# Patient Record
Sex: Female | Born: 1935 | Race: White | Hispanic: No | Smoking: Former smoker
Health system: Southern US, Community
[De-identification: ages and names within clinical notes are randomized; demographics above are authoritative.]

## PROBLEM LIST (undated history)

## (undated) DIAGNOSIS — M810 Age-related osteoporosis without current pathological fracture: Secondary | ICD-10-CM

## (undated) DIAGNOSIS — N184 Chronic kidney disease, stage 4 (severe): Secondary | ICD-10-CM

## (undated) DIAGNOSIS — I10 Essential (primary) hypertension: Secondary | ICD-10-CM

## (undated) DIAGNOSIS — M48061 Spinal stenosis, lumbar region without neurogenic claudication: Secondary | ICD-10-CM

## (undated) DIAGNOSIS — G8929 Other chronic pain: Secondary | ICD-10-CM

## (undated) DIAGNOSIS — H409 Unspecified glaucoma: Secondary | ICD-10-CM

## (undated) DIAGNOSIS — M545 Low back pain: Secondary | ICD-10-CM

## (undated) DIAGNOSIS — M1990 Unspecified osteoarthritis, unspecified site: Secondary | ICD-10-CM

## (undated) DIAGNOSIS — E039 Hypothyroidism, unspecified: Secondary | ICD-10-CM

## (undated) DIAGNOSIS — M8580 Other specified disorders of bone density and structure, unspecified site: Secondary | ICD-10-CM

## (undated) HISTORY — DX: Unspecified glaucoma: H40.9

## (undated) HISTORY — DX: Hypothyroidism, unspecified: E03.9

## (undated) HISTORY — PX: KNEE ARTHROSCOPY: SUR90

## (undated) HISTORY — PX: VARICOSE VEIN SURGERY: SHX832

## (undated) HISTORY — DX: Age-related osteoporosis without current pathological fracture: M81.0

## (undated) HISTORY — DX: Essential (primary) hypertension: I10

## (undated) HISTORY — PX: ABDOMINAL HYSTERECTOMY: SHX81

## (undated) HISTORY — PX: ESOPHAGOGASTRODUODENOSCOPY (EGD) WITH ESOPHAGEAL DILATION: SHX5812

## (undated) HISTORY — DX: Other chronic pain: G89.29

## (undated) HISTORY — DX: Spinal stenosis, lumbar region without neurogenic claudication: M48.061

## (undated) HISTORY — DX: Low back pain: M54.5

## (undated) HISTORY — DX: Chronic kidney disease, stage 4 (severe): N18.4

---

## 2002-02-25 ENCOUNTER — Encounter: Payer: Self-pay | Admitting: Orthopaedic Surgery

## 2002-02-25 ENCOUNTER — Ambulatory Visit (HOSPITAL_COMMUNITY): Admission: RE | Admit: 2002-02-25 | Discharge: 2002-02-25 | Payer: Self-pay | Admitting: Orthopaedic Surgery

## 2002-03-25 ENCOUNTER — Ambulatory Visit (HOSPITAL_COMMUNITY): Admission: RE | Admit: 2002-03-25 | Discharge: 2002-03-25 | Payer: Self-pay | Admitting: Unknown Physician Specialty

## 2002-03-25 ENCOUNTER — Encounter: Payer: Self-pay | Admitting: Unknown Physician Specialty

## 2002-08-03 ENCOUNTER — Ambulatory Visit (HOSPITAL_COMMUNITY): Admission: RE | Admit: 2002-08-03 | Discharge: 2002-08-03 | Payer: Self-pay | Admitting: Unknown Physician Specialty

## 2002-09-10 ENCOUNTER — Ambulatory Visit (HOSPITAL_COMMUNITY): Admission: RE | Admit: 2002-09-10 | Discharge: 2002-09-10 | Payer: Self-pay | Admitting: Internal Medicine

## 2003-03-30 ENCOUNTER — Ambulatory Visit (HOSPITAL_COMMUNITY): Admission: RE | Admit: 2003-03-30 | Discharge: 2003-03-30 | Payer: Self-pay | Admitting: Unknown Physician Specialty

## 2004-05-01 ENCOUNTER — Ambulatory Visit (HOSPITAL_COMMUNITY): Admission: RE | Admit: 2004-05-01 | Discharge: 2004-05-01 | Payer: Self-pay | Admitting: Family Medicine

## 2004-06-12 ENCOUNTER — Ambulatory Visit: Payer: Self-pay | Admitting: Family Medicine

## 2004-06-15 ENCOUNTER — Ambulatory Visit: Payer: Self-pay | Admitting: Internal Medicine

## 2004-09-21 ENCOUNTER — Ambulatory Visit: Payer: Self-pay | Admitting: Family Medicine

## 2005-04-03 ENCOUNTER — Ambulatory Visit: Payer: Self-pay | Admitting: Family Medicine

## 2005-05-11 ENCOUNTER — Ambulatory Visit (HOSPITAL_COMMUNITY): Admission: RE | Admit: 2005-05-11 | Discharge: 2005-05-11 | Payer: Self-pay | Admitting: Family Medicine

## 2005-06-13 ENCOUNTER — Ambulatory Visit: Payer: Self-pay | Admitting: Family Medicine

## 2005-06-28 ENCOUNTER — Ambulatory Visit: Payer: Self-pay | Admitting: Internal Medicine

## 2005-08-02 ENCOUNTER — Ambulatory Visit: Payer: Self-pay | Admitting: Family Medicine

## 2005-09-19 ENCOUNTER — Ambulatory Visit: Payer: Self-pay | Admitting: Family Medicine

## 2005-09-26 ENCOUNTER — Emergency Department (HOSPITAL_COMMUNITY): Admission: EM | Admit: 2005-09-26 | Discharge: 2005-09-26 | Payer: Self-pay | Admitting: Emergency Medicine

## 2005-09-28 ENCOUNTER — Emergency Department (HOSPITAL_COMMUNITY): Admission: EM | Admit: 2005-09-28 | Discharge: 2005-09-28 | Payer: Self-pay | Admitting: Emergency Medicine

## 2005-12-04 ENCOUNTER — Encounter: Admission: RE | Admit: 2005-12-04 | Discharge: 2006-01-03 | Payer: Self-pay | Admitting: Orthopedic Surgery

## 2006-01-21 ENCOUNTER — Ambulatory Visit: Payer: Self-pay | Admitting: Family Medicine

## 2006-02-27 ENCOUNTER — Emergency Department (HOSPITAL_COMMUNITY): Admission: EM | Admit: 2006-02-27 | Discharge: 2006-02-27 | Payer: Self-pay | Admitting: Emergency Medicine

## 2006-03-12 ENCOUNTER — Ambulatory Visit: Payer: Self-pay | Admitting: Family Medicine

## 2006-04-12 ENCOUNTER — Ambulatory Visit: Payer: Self-pay | Admitting: Internal Medicine

## 2006-05-21 ENCOUNTER — Ambulatory Visit: Payer: Self-pay | Admitting: Family Medicine

## 2007-06-24 ENCOUNTER — Ambulatory Visit (HOSPITAL_COMMUNITY): Admission: RE | Admit: 2007-06-24 | Discharge: 2007-06-24 | Payer: Self-pay | Admitting: Family Medicine

## 2010-05-16 ENCOUNTER — Ambulatory Visit: Admit: 2010-05-16 | Payer: Self-pay | Admitting: Internal Medicine

## 2010-06-03 ENCOUNTER — Encounter: Payer: Self-pay | Admitting: Family Medicine

## 2010-09-29 NOTE — Procedures (Signed)
   NAME:  Laura Ellis, Laura Ellis                         ACCOUNT NO.:  [id number]   MEDICAL RECORD NO.:  [id number]                   PATIENT TYPE:  OUT   LOCATION:  RAD                                  FACILITY:  [facility]   PHYSICIAN:  Scarlett Presto, M.D.                DATE OF BIRTH:  08-27-1932   DATE OF PROCEDURE:  08/03/2002  DATE OF DISCHARGE:                                  ECHOCARDIOGRAM   TAPE NUMBER:  [id number]   TAPE COUNT:  [id number]   CLINICAL INFORMATION:  This is a 77 year old woman with a systolic murmur.  No other cardiac history.   TECHNICAL QUALITY:  The technical quality of this study was adequate for  interpretation.   MEASUREMENTS M-MODE:  1. The aorta is 31 mm.  2. The left atrium is 37 mm.  3. The septum is 8 mm.  4. The posterior wall is 10 mm.  5. The left ventricular diastolic dimension is 37 mm.  6. The left ventricular systolic dimension is 26 mm.   2-D AND DOPPLER IMAGING:  1. The left ventricle is normal size with normal systolic function.  No wall     motion abnormalities are seen.  2. The right ventricle is normal size with normal systolic function.  3. Both atria are normal size.  There is no atrioseptal defect.  4. The aortic valve is mildly sclerotic with no stenosis or regurgitation.  5. The mitral valve is morphologically unremarkable with trace     insufficiency.  No stenosis is seen.  6. The tricuspid valve is morphologically unremarkable with trace     insufficiency.  No stenosis is seen.  7. The pulmonic valve was not well seen.  8. The ascending aorta was not well seen.  9. There was no pericardial effusion.                                               Scarlett Presto, M.D.    JH/MEDQ  D:  08/03/2002  T:  08/04/2002  Job:  [id number]

## 2010-09-29 NOTE — Consult Note (Signed)
NAME:  Laura Ellis, Laura Ellis                         ACCOUNT NO.:  [id number]   MEDICAL RECORD NO.:  [id number]                   PATIENT TYPE:  OUT   LOCATION:  RAD                                  FACILITY:  [facility]   PHYSICIAN:  Hildred Laser, M.D.                 DATE OF BIRTH:  01/03/1933   DATE OF CONSULTATION:  DATE OF DISCHARGE:  08/03/2002                                   CONSULTATION   REPORT TITLE:  GASTROENTEROLOGY CONSULTATION.   REFERRING PHYSICIAN:  Christell Constant, MD   REASON FOR CONSULTATION:  Dysphagia, indigestion.   HISTORY OF PRESENT ILLNESS:  The patient is a 77 year old Caucasian female  patient of Dr. Mallie Mussel who presents today for evaluation of a several month  history of dysphagia.  She states that she had been on Fosamax for about a  year when, in November 2003, she developed dysphagia and indigestion type  symptoms.  She is off medication.  She has been off the medication now for  four months but continues to have problems swallowing.  She was placed on  Protonix 40 mg daily which seemed to help the heartburn indigestion type  symptoms. She continues to have problems swallowing, however.  Solid foods  seem to stick in her esophagus.  At times, she has to force them down with  fluids. This causes pain.   She denies any abdominal pain.  Her bowels are moving more frequently than  normal for her.  Over the last couple of months, she has been having 3-4  very loose stools daily.  Denies any nocturnal stools.  She has not been on  any recent antibiotics or had any travel abroad.  She denies any bright red  blood per rectum.  Back in November 2003, she did have a couple of black  stools.  She was recently hemoccult negative x1.  She never had an EGD or  colonoscopy.   CURRENT MEDICATIONS:  1. Diazepam 2 mg p.r.n.  2. Clarinex 5 mg every other day.  3. Aspirin 81 mg daily.  4. Ibuprofen 200 mg, three daily.  5. Calcium with vitamin D.  6. Potassium daily.  7.  Gerimed multivitamins daily.  8. Flax seed oil 1000 mg daily.  9. MSM 750 mg daily.  10.      Vitamin E 200 international units daily.  11.      Red yeast rice 600 mg daily.  12.      Protonix 40 mg daily.   ALLERGIES:  No known drug allergies.   PAST MEDICAL HISTORY:  1. Hypercholesterolemia.  2. Seasonal allergies.  3. Arthritis.  4. Osteoporosis.  5. Gastroesophageal reflux disease.  6. The patient reports a history of heart murmur and recently had an     echocardiogram.  Please see below for details.  7. History of carotid bruits with no evidence of stenosis on previous  ultrasound.   PAST SURGICAL HISTORY:  1. Hysterectomy.  2. D&C x4.  3. Left breast cyst removal.   FAMILY HISTORY:  Mother died of massive hemorrhage.  Father died of lung  cancer.  No family history of colorectal cancer or chronic GI illnesses.   SOCIAL HISTORY:  She has been married x25 years, has three children.  She is  retired.  She quit smoking almost a year ago after smoking for over 40  years.  She rarely consumes alcohol.   REVIEW OF SYSTEMS:  Please see HPI for GI.  GENERAL:  Denies any  unintentional weight loss.  CARDIOPULMONARY:  Denies shortness of breath or  chest pain.   PHYSICAL EXAMINATION:  VITAL SIGNS: Weight 176, height 5 feet 5 inches,  temperature 96.8, blood pressure 134/84, pulse 80.  GENERAL:  Pleasant, well-nourished, well-developed Caucasian female in no  acute distress.  SKIN:  Warm and dry, no jaundice.  HEENT:  Conjunctivae pink.  Sclerae nonicteric.  Oral mucosa moist.  No  acute distress.  No lesions, erythema, or exudates.  NECK:  No lymphadenopathy or thyromegaly.  Audible left carotid bruit.  CHEST:  Lungs clear to auscultation.  CARDIOVASCULAR:  Regular rate and rhythm.  No murmurs, rubs, or gallops.  ABDOMEN:  Positive bowel sounds.  Soft, nontender, nondistended.  No  organomegaly or masses.  EXTREMITIES:  No edema.   LABORATORY DATA:  Echocardiogram on  August 03, 2002, revealed normal LV size  and systolic function.  Aortic valve was mildly sclerotic but no stenosis or  regurgitation.  Mitral valve and tricuspid valves were morphologically  unremarkable and had trace insufficiency, no stenosis.   IMPRESSION:  1. The patient is a pleasant 77 year old lady who has an approximately six     month history of gastroesophageal reflux disease and esophageal dysphagia     to solid food.  Symptoms were noted after one year of Fosamax use.  They     have not improved since discontinuing Fosamax.  Suspect symptoms are     related to gastroesophageal reflux disease.  She may have developed     esophageal webbing or stricture.  I discussed possible     esophagogastroduodenoscopy with dilatation with the patient today, and     she is agreeable to proceed.  2. She complains of chronic diarrhea.  She is having multiple loose stools     daily which do not seem to be related to meals.  She denies any new     medications at the time of developing symptoms.  Will begin workup to     rule out infectious etiology although less likely.  She has never had a     screening colonoscopy, and we had talked about undergoing one at some     point in the near future.   PLAN:  1. EGD with dilatation.  2. Suggest colonoscopy at some point in the near future, primarily for     screening.  However, if she continues to have diarrhea, random biopsy.  3. She will continue Protonix 40 mg daily.  4. Stool for WBC, C. difficile, ova and parasites, and culture.  5. __________ one p.o. daily, #30 with one refill.  6. Will request recent labs from  Dr. Mallie Mussel regarding left carotid bruit.     The patient is unsure whether she has had an ultrasound of the left     carotid previously.   I would like to thank Dr. Mallie Mussel for allowing [place] to  take part in the care of  this patient.    Laureen Ochs. Bobby Rumpf, P.A.-C                   Hildred Laser, M.D.    LSL/MEDQ  D:  08/26/2002  T:   08/26/2002  Job:  [id number]   cc:   Trellis Moment, MD  [place]  [place] [address]  Fax: [phone]

## 2010-09-29 NOTE — Op Note (Signed)
NAME:  Laura, Ellis                         ACCOUNT NO.:  [id number]   MEDICAL RECORD NO.:  [id number]                   PATIENT TYPE:  AMB   LOCATION:  DAY                                  FACILITY:  [facility]   PHYSICIAN:  Hildred Laser, M.D.                 DATE OF BIRTH:  [birth year]   DATE OF PROCEDURE:  09/10/2002  DATE OF DISCHARGE:                                 OPERATIVE REPORT   PROCEDURES:  Esophagogastroduodenoscopy with esophageal dilation, followed  by total colonoscopy.   INDICATIONS:  The patient is a 77 year old Caucasian female with symptoms of  chronic GERD, who has recurrent solid food dysphagia over the last few  months.  She also has chronic nonbloody diarrhea.  Stool studies in the past  have been negative.  We felt that she has IBS, but she has not responded to  therapy.  She also has never been screened for colorectal carcinoma.  She is  therefore undergoing both of these studies.  The procedure risk were  reviewed with the patient, and informed consent was obtained.   PREMEDICATION:  Cetacaine spray for pharyngeal topical anesthesia, Demerol  50 mg IV, Versed 6 mg IV in divided dose.   INSTRUMENT USED:  Olympus video system.   FINDINGS:  Procedure performed in endoscopy suite.  The patient's vital  signs and O2 saturation were monitored during the procedure and remained  stable.   PROCEDURE:  Esophagogastroduodenoscopy.   DESCRIPTION OF PROCEDURE:  The patient was placed in the left lateral  recumbent position and the endoscope was passed via oropharynx without any  difficulty into esophagus.   Esophagus:  Mucosa of the esophagus was normal throughout.  She had  incomplete ring at esophagogastric junction.  Gastric mucosa below the Z-  line was somewhat erythematous, but there were no erosions or ulcers.  A  picture was taken for the record.  There was a small sliding hiatal hernia./   Stomach:  It was empty and distended very well with  insufflation.  Folds of  the proximal stomach were normal.  Examination of the mucosa revealed a few  antral erosions.  The pyloric channel was patent.  Angularis and fundus were  examined by retroflexing the scope and was normal.   Duodenum:  Examination of the bulb revealed nodularity with friable mucosa  in the proximal bulb.  Mucosa of the distal bulb also revealed some edema  and granularity but no erosions or ulcers were noted.  A biopsy was taken  from this abnormal-looking mucosa.  The postbulbar mucosa and folds were  normal.   The endoscope was withdrawn.  The esophagus was dilated by passing a 65  [place] Maloney dilator, but the ring was intact and therefore disrupted with  focal biopsy at four different sites.  The endoscope was withdrawn and the  patient prepared for procedure #2.   PROCEDURE:  Total colonoscopy.  DESCRIPTION OF PROCEDURE:  Rectal examination performed.  No abnormality  noted on external or digital exam.  The scope was placed in the rectum and  advanced under vision into the sigmoid colon and beyond.  Preparation was  satisfactory.  The scope was passed to the cecum, which was identified by  the ileocecal valve and appendiceal orifice.  A picture was taken for the  record.  As the scope was withdrawn the colonic mucosa was carefully  examined and was normal throughout.  Random biopsies taken from the sigmoid  colon looking for microscopic colitis.  The rectal mucosa was normal.  The  scope was retroflexed to examine the anorectal junction, which was  unremarkable.  The endoscope was straightened and withdrawn.  The patient  tolerated the procedure well.   FINAL DIAGNOSES:  1. Mild changes of reflux esophagitis limited to the gastroesophageal     junction, along with incomplete Schatzki's ring and a small sliding     hiatal hernia.  2. Erosive gastritis.  3. Bulbar duodenitis with mucosal nodularity, possibly pseudopolyps, biopsy     taken.  4.  Esophagus dilated by passing 56 [place] Maloney dilator.  The ring had to     be disrupted with focal biopsy.  5. Normal colonoscopy.  Random biopsies taken from sigmoid colon looking for     microscopic or collagenous colitis.   RECOMMENDATIONS:  1. She will continue antireflux measures and Protonix as before.  2. H. pylori serology will be checked.  3. I would like for her to stay on Levbid but start Citrucel one     tablespoonful daily.  4. I will contact the patient with the results of her pending tests and     further recommendations.  5. I would furthermore like to see her in the office in eight weeks from now     to make sure all her GI symptoms have resolved.                                               Hildred Laser, M.D.    NR/MEDQ  D:  09/10/2002  T:  09/10/2002  Job:  [id number]   cc:   Trellis Moment, MD  [place]  [place] [address]  Fax: [phone]

## 2011-01-22 DIAGNOSIS — E785 Hyperlipidemia, unspecified: Secondary | ICD-10-CM | POA: Insufficient documentation

## 2011-08-28 DIAGNOSIS — R0989 Other specified symptoms and signs involving the circulatory and respiratory systems: Secondary | ICD-10-CM | POA: Insufficient documentation

## 2013-05-14 HISTORY — PX: COLONOSCOPY: SHX174

## 2013-05-14 HISTORY — PX: ESOPHAGOGASTRODUODENOSCOPY: SHX1529

## 2013-11-19 ENCOUNTER — Encounter (HOSPITAL_COMMUNITY): Payer: Self-pay | Admitting: Emergency Medicine

## 2013-11-19 ENCOUNTER — Emergency Department (HOSPITAL_COMMUNITY)
Admission: EM | Admit: 2013-11-19 | Discharge: 2013-11-19 | Disposition: A | Discharge status: Home / Self Care | Payer: Medicare Other | Attending: Emergency Medicine | Admitting: Emergency Medicine

## 2013-11-19 ENCOUNTER — Emergency Department (HOSPITAL_COMMUNITY): Payer: Medicare Other

## 2013-11-19 DIAGNOSIS — W5501XA Bitten by cat, initial encounter: Secondary | ICD-10-CM

## 2013-11-19 DIAGNOSIS — I10 Essential (primary) hypertension: Secondary | ICD-10-CM | POA: Insufficient documentation

## 2013-11-19 DIAGNOSIS — S61551A Open bite of right wrist, initial encounter: Secondary | ICD-10-CM

## 2013-11-19 DIAGNOSIS — Z8739 Personal history of other diseases of the musculoskeletal system and connective tissue: Secondary | ICD-10-CM | POA: Insufficient documentation

## 2013-11-19 DIAGNOSIS — Y93K9 Activity, other involving animal care: Secondary | ICD-10-CM | POA: Insufficient documentation

## 2013-11-19 DIAGNOSIS — Y929 Unspecified place or not applicable: Secondary | ICD-10-CM | POA: Insufficient documentation

## 2013-11-19 DIAGNOSIS — Z23 Encounter for immunization: Secondary | ICD-10-CM | POA: Insufficient documentation

## 2013-11-19 DIAGNOSIS — S61509A Unspecified open wound of unspecified wrist, initial encounter: Secondary | ICD-10-CM | POA: Insufficient documentation

## 2013-11-19 DIAGNOSIS — Z87891 Personal history of nicotine dependence: Secondary | ICD-10-CM | POA: Insufficient documentation

## 2013-11-19 DIAGNOSIS — IMO0001 Reserved for inherently not codable concepts without codable children: Secondary | ICD-10-CM | POA: Insufficient documentation

## 2013-11-19 DIAGNOSIS — R110 Nausea: Secondary | ICD-10-CM | POA: Insufficient documentation

## 2013-11-19 HISTORY — DX: Essential (primary) hypertension: I10

## 2013-11-19 HISTORY — DX: Other specified disorders of bone density and structure, unspecified site: M85.80

## 2013-11-19 HISTORY — DX: Unspecified osteoarthritis, unspecified site: M19.90

## 2013-11-19 LAB — CBC
HCT: 35.7 % — ABNORMAL LOW (ref 36.0–46.0)
Hemoglobin: 11.9 g/dL — ABNORMAL LOW (ref 12.0–15.0)
MCH: 30.9 pg (ref 26.0–34.0)
MCHC: 33.3 g/dL (ref 30.0–36.0)
MCV: 92.7 fL (ref 78.0–100.0)
Platelets: 231 10*3/uL (ref 150–400)
RBC: 3.85 MIL/uL — ABNORMAL LOW (ref 3.87–5.11)
RDW: 13.1 % (ref 11.5–15.5)
WBC: 11.3 10*3/uL — ABNORMAL HIGH (ref 4.0–10.5)

## 2013-11-19 LAB — BASIC METABOLIC PANEL
Anion gap: 14 (ref 5–15)
BUN: 29 mg/dL — ABNORMAL HIGH (ref 6–23)
CO2: 22 mEq/L (ref 19–32)
Calcium: 9.3 mg/dL (ref 8.4–10.5)
Chloride: 103 mEq/L (ref 96–112)
Creatinine, Ser: 1.26 mg/dL — ABNORMAL HIGH (ref 0.50–1.10)
GFR calc Af Amer: 46 mL/min — ABNORMAL LOW (ref >90)
GFR calc non Af Amer: 40 mL/min — ABNORMAL LOW (ref >90)
Glucose, Bld: 134 mg/dL — ABNORMAL HIGH (ref 70–99)
Potassium: 4.3 mEq/L (ref 3.7–5.3)
Sodium: 139 mEq/L (ref 137–147)

## 2013-11-19 LAB — SEDIMENTATION RATE: Sed Rate: 20 mm/hr (ref 0–22)

## 2013-11-19 MED ORDER — TETANUS-DIPHTH-ACELL PERTUSSIS 5-2.5-18.5 LF-MCG/0.5 IM SUSP
0.5000 mL | Freq: Once | INTRAMUSCULAR | Status: AC
  Administered 2013-11-19: 0.5 mL via INTRAMUSCULAR
  Filled 2013-11-19: qty 0.5

## 2013-11-19 MED ORDER — ONDANSETRON 4 MG PO TBDP
4.0000 mg | ORAL_TABLET | Freq: Once | ORAL | Status: AC
  Administered 2013-11-19: 4 mg via ORAL
  Filled 2013-11-19: qty 1

## 2013-11-19 MED ORDER — AMOXICILLIN-POT CLAVULANATE 875-125 MG PO TABS: 1.0000 | ORAL_TABLET | Freq: Two times a day (BID) | ORAL | Status: DC

## 2013-11-19 MED ORDER — AMOXICILLIN-POT CLAVULANATE 875-125 MG PO TABS
1.0000 | ORAL_TABLET | Freq: Once | ORAL | Status: AC
  Administered 2013-11-19: 1 via ORAL
  Filled 2013-11-19: qty 1

## 2013-11-19 MED ORDER — HYDROCODONE-ACETAMINOPHEN 5-325 MG PO TABS
1.0000 | ORAL_TABLET | Freq: Once | ORAL | Status: AC
  Administered 2013-11-19: 1 via ORAL
  Filled 2013-11-19: qty 1

## 2013-11-19 MED ORDER — HYDROCODONE-ACETAMINOPHEN 5-325 MG PO TABS: 1.0000 | ORAL_TABLET | Freq: Four times a day (QID) | ORAL | Status: DC | PRN

## 2013-11-19 NOTE — ED Provider Notes (Signed)
CSN: [id number]     Arrival date & time 11/19/13  2014 History   First MD Initiated Contact with Patient 11/19/13 2141     Chief Complaint  Patient presents with  . Animal Bite     (Consider location/radiation/quality/duration/timing/severity/associated sxs/prior Treatment) Patient is a 80 y.o. female presenting with animal bite. The history is provided by the patient.  Animal Bite Contact animal:  Cat Location:  Shoulder/arm Shoulder/arm injury location:  R wrist Time since incident:  1 day Pain details:    Quality:  Aching   Severity:  Mild   Timing:  Constant   Progression:  Worsening Provoked: provoked   Notifications:  None Animal's rabies vaccination status:  Up to date Animal in possession: no   Associated symptoms: no fever     Past Medical History  Diagnosis Date  . Hypertension   . Arthritis   . Osteopenia    Past Surgical History  Procedure Laterality Date  . Abdominal hysterectomy    . Knee arthroscopy    . Varicose vein surgery     No family history on file. History  Substance Use Topics  . Smoking status: Former Research scientist (life sciences)  . Smokeless tobacco: Former Systems developer    Quit date: 12/03/2001  . Alcohol Use: Yes     Comment: occ   OB History   Grav Para Term Preterm Abortions TAB SAB Ect Mult Living                 Review of Systems  Constitutional: Negative for fever and chills.  Respiratory: Negative for cough and shortness of breath.   Cardiovascular: Negative for chest pain and leg swelling.  Gastrointestinal: Positive for nausea. Negative for vomiting and abdominal pain.  All other systems reviewed and are negative.     Allergies  Review of patient's allergies indicates no known allergies.  Home Medications   Prior to Admission medications   Not on File   BP 143/76  Pulse 83  Temp(Src) 98.2 F (36.8 C) (Oral)  Resp 18  Ht 5\' 4"  (1.626 m)  Wt 160 lb (72.576 kg)  BMI 27.45 kg/m2  SpO2 95% Physical Exam  Nursing note and vitals  reviewed. Constitutional: She is oriented to person, place, and time. She appears well-developed and well-nourished. No distress.  HENT:  Head: Normocephalic and atraumatic.  Eyes: EOM are normal. Pupils are equal, round, and reactive to light.  Neck: Normal range of motion. Neck supple.  Cardiovascular: Normal rate and regular rhythm.  Exam reveals no friction rub.   No murmur heard. Pulmonary/Chest: Effort normal and breath sounds normal. No respiratory distress. She has no wheezes. She has no rales.  Abdominal: Soft. She exhibits no distension. There is no tenderness. There is no rebound.  Musculoskeletal: Normal range of motion. She exhibits no edema.       Right wrist: She exhibits tenderness and swelling (mild posterior at bite site). She exhibits normal range of motion and no effusion.  Two puncture wounds - one on posterior wrist with mild redness, other at snuffbox. Pain with ROM, mildly reduced. No joint effusion.  Neurological: She is alert and oriented to person, place, and time.  Skin: She is not diaphoretic.    ED Course  Procedures (including critical care time) Labs Review Labs Reviewed  CBC - Abnormal; Notable for the following:    WBC 11.3 (*)    RBC 3.85 (*)    Hemoglobin 11.9 (*)    HCT 35.7 (*)  All other components within normal limits  BASIC METABOLIC PANEL  SEDIMENTATION RATE    Imaging Review Dg Wrist Complete Right  11/19/2013   CLINICAL DATA:  Redness and swelling to the first metacarpal and radial side right wrist after being bit by cat yesterday.  EXAM: RIGHT WRIST - COMPLETE 3+ VIEW  COMPARISON:  None.  FINDINGS: There is no evidence of fracture or dislocation. There is no evidence of arthropathy or other focal bone abnormality. Soft tissues are unremarkable. No radiopaque foreign bodies.  IMPRESSION: Negative.   Electronically Signed   By: Lucienne Capers M.D.   On: 11/19/2013 22:53     EKG Interpretation None      MDM   Final diagnoses:   Cat bite of wrist, right, initial encounter    80 year old female presents with Bite to the right wrist. Bit by her older cat Cocoa yesterday during a bath. Since then she has had worsening pain and swelling to the right wrist. No fevers, vomiting. She does have mild nausea here. On exam she has stable vitals. Right wrist has a puncture wound in the middle of the dorsal wrist and one at the lateral side at the level of the snuffbox. There is mild swelling at each bite site but no joint effusion. Mild erythema. She has no axillary lymphadenopathy. She has mild red streaking in her arm that is around the medial elbow. Without major joint effusion, doubt septic arthritis. Concerned of her infected puncture wound. Mild white count 11.3. Augmentin given a tetanus updated. I will speak with hand surgery for followup. Dr. Lenon Curt felt f/u was appropriate. Given Augmentin, given strict return precautions. Stable for discharge.  Osvaldo Shipper, MD 11/19/13 [phone]

## 2013-11-19 NOTE — ED Notes (Signed)
PT states that she was bit by one of her cats last night to the rt wrist area; pt states that she cleaned the area and applied Neosporin but today it has become more swollen and pain radiating up her rt arm; pt also c/o nausea after arriving to the ER

## 2013-11-19 NOTE — Discharge Instructions (Signed)

## 2015-02-14 DIAGNOSIS — J301 Allergic rhinitis due to pollen: Secondary | ICD-10-CM | POA: Insufficient documentation

## 2015-02-14 DIAGNOSIS — Z8719 Personal history of other diseases of the digestive system: Secondary | ICD-10-CM | POA: Insufficient documentation

## 2015-02-14 DIAGNOSIS — K219 Gastro-esophageal reflux disease without esophagitis: Secondary | ICD-10-CM | POA: Insufficient documentation

## 2015-05-26 ENCOUNTER — Ambulatory Visit (INDEPENDENT_AMBULATORY_CARE_PROVIDER_SITE_OTHER): Payer: Commercial Managed Care - HMO | Admitting: Osteopathic Medicine

## 2015-05-26 ENCOUNTER — Ambulatory Visit (INDEPENDENT_AMBULATORY_CARE_PROVIDER_SITE_OTHER): Payer: Commercial Managed Care - HMO

## 2015-05-26 ENCOUNTER — Encounter: Payer: Self-pay | Admitting: Osteopathic Medicine

## 2015-05-26 VITALS — BP 175/56 | HR 84 | Temp 98.1°F | Ht 64.0 in | Wt 161.0 lb

## 2015-05-26 DIAGNOSIS — M179 Osteoarthritis of knee, unspecified: Secondary | ICD-10-CM | POA: Diagnosis not present

## 2015-05-26 DIAGNOSIS — M545 Low back pain, unspecified: Secondary | ICD-10-CM

## 2015-05-26 DIAGNOSIS — M25561 Pain in right knee: Secondary | ICD-10-CM | POA: Diagnosis not present

## 2015-05-26 DIAGNOSIS — M25562 Pain in left knee: Secondary | ICD-10-CM

## 2015-05-26 DIAGNOSIS — M11261 Other chondrocalcinosis, right knee: Secondary | ICD-10-CM | POA: Diagnosis not present

## 2015-05-26 DIAGNOSIS — M5136 Other intervertebral disc degeneration, lumbar region: Secondary | ICD-10-CM | POA: Diagnosis not present

## 2015-05-26 DIAGNOSIS — M1711 Unilateral primary osteoarthritis, right knee: Secondary | ICD-10-CM

## 2015-05-26 DIAGNOSIS — M810 Age-related osteoporosis without current pathological fracture: Secondary | ICD-10-CM

## 2015-05-26 DIAGNOSIS — I10 Essential (primary) hypertension: Secondary | ICD-10-CM | POA: Insufficient documentation

## 2015-05-26 DIAGNOSIS — H269 Unspecified cataract: Secondary | ICD-10-CM | POA: Diagnosis not present

## 2015-05-26 DIAGNOSIS — G8929 Other chronic pain: Secondary | ICD-10-CM

## 2015-05-26 DIAGNOSIS — J069 Acute upper respiratory infection, unspecified: Secondary | ICD-10-CM

## 2015-05-26 HISTORY — DX: Other chronic pain: G89.29

## 2015-05-26 HISTORY — DX: Age-related osteoporosis without current pathological fracture: M81.0

## 2015-05-26 HISTORY — DX: Essential (primary) hypertension: I10

## 2015-05-26 HISTORY — DX: Low back pain, unspecified: M54.50

## 2015-05-26 NOTE — Progress Notes (Signed)
HPI: Laura Ellis is a 82 y.o. female who presents to [facility] today for chief complaint of:  Chief Complaint  Patient presents with  . Establish Care  . Cough    COUGH/COLD . Location: chest/sinuses  . Quality: congestion, mild coughing . Duration: 3 days . Modifying factors: OTC cough syrup, and antihistamine.  . Assoc signs/symptoms: no fever/chills  LEGS: . Location: legs, worse in R leg, goes down the leg  . Quality: pulled muscle, started in L upper leg, X-rays ok but had arhtroscopy. Reports hip pain.  . Duration: 5+ years . Modifying factors: seeing chiropractor, reports spinal arthritis, no Hx MRI/injections. No pain meds. Did some PT last year for about a month, did help some . Assoc signs/symptoms: Hx back injury few years ago when lifting improperly  HTN: didn't take meds this morning, BP elevated, no CP/SOB, no dizziness or vision changes.    Past medical, social and family history reviewed: Past Medical History  Diagnosis Date  . Hypertension   . Arthritis   . Osteopenia    Past Surgical History  Procedure Laterality Date  . Abdominal hysterectomy    . Knee arthroscopy    . Varicose vein surgery     Social History  Substance Use Topics  . Smoking status: Former Research scientist (life sciences)  . Smokeless tobacco: Former Systems developer    Quit date: 12/03/2001  . Alcohol Use: Yes     Comment: occ   History reviewed. No pertinent family history.  Current Outpatient Prescriptions  Medication Sig Dispense Refill  . Cholecalciferol (VITAMIN D PO) Take 1 tablet by mouth daily.    . Cyanocobalamin (VITAMIN B-12 PO) Take 1 tablet by mouth daily.    . famotidine (PEPCID AC) 10 MG chewable tablet Chew 10 mg by mouth 2 (two) times daily.    [name] HYDROcodone-acetaminophen (NORCO/VICODIN) 5-325 MG per tablet Take 1 tablet by mouth every 6 (six) hours as needed for moderate pain. 30 tablet 0  . ketotifen (THERA TEARS ALLERGY) 0.025 % ophthalmic solution Place  1 drop into both eyes 2 (two) times daily.    [name] loratadine (CLARITIN) 10 MG tablet Take 10 mg by mouth daily.    [name] losartan (COZAAR) 25 MG tablet Take 25 mg by mouth daily.    . Methylsulfonylmethane (MSM PO) Take 1 tablet by mouth 2 (two) times daily.    . Multiple Vitamin (MULTIVITAMIN WITH MINERALS) TABS tablet Take 1 tablet by mouth daily.    [name] NAPROXEN PO Take 1 tablet by mouth 2 (two) times daily.    . Omega-3 Fatty Acids (FISH OIL PO) Take 1 capsule by mouth daily.    [name] OVER THE COUNTER MEDICATION Take 1 tablet by mouth daily.     No current facility-administered medications for this visit.   No Known Allergies    Review of Systems: CONSTITUTIONAL:  No  fever, no chills, No  unintentional weight changes HEAD/EYES/EARS/NOSE/THROAT: No  headache, no vision change, no hearing change, No  sore throat, No  sinus pressure CARDIAC: No  chest pain, No  pressure, No palpitations, No  orthopnea RESPIRATORY: No  cough, No  shortness of breath/wheeze GASTROINTESTINAL: No  nausea, No  vomiting, No  abdominal pain, No  blood in stool, No  diarrhea, No  constipation  MUSCULOSKELETAL: No  myalgia/arthralgia GENITOURINARY: No  incontinence, No  abnormal genital bleeding/discharge SKIN: No  rash/wounds/concerning lesions HEM/ONC: No  easy bruising/bleeding, No  abnormal lymph node ENDOCRINE: No polyuria/polydipsia/polyphagia, No  heat/cold intolerance  NEUROLOGIC: No  weakness, No  dizziness, No  slurred speech PSYCHIATRIC: No  concerns with depression, No  concerns with anxiety, No sleep problems  Exam:  BP 175/56 mmHg  Pulse 84  Temp(Src) 98.1 F (36.7 C) (Oral)  Ht 5\' 4"  (1.626 m)  Wt 161 lb (73.029 kg)  BMI 27.62 kg/m2 Constitutional: VS see above. General Appearance: alert, well-developed, well-nourished, NAD Eyes: Normal lids and conjunctive, non-icteric sclera, PERRLA Ears, Nose, Mouth, Throat: MMM, Normal external inspection ears/nares/mouth/lips/gums, TM normal, no pharyngeal  erythema No  exudate Neck: No masses, trachea midline. No thyroid enlargement/tenderness/mass appreciated. No lymphadenopathy Respiratory: Normal respiratory effort. no wheeze, no rhonchi, no rales Cardiovascular: S1/S2 normal, no murmur, no rub/gallop auscultated. RRR. No lower extremity edema. Gastrointestinal: Nontender,  Musculoskeletal: Gait normal. No clubbing/cyanosis of digits. Knee ligaments stable on drawer test and varus/valgus stress, no crepitus. Neg straight leg raise bilaterally, (+) paraspinal tenderness b/l lumbar spine, no midline tenderness Neurological: No cranial nerve deficit on limited exam. Motor and sensation intact and symmetric Skin: warm, dry, intact. No rash/ulcer. No concerning nevi or subq nodules on limited exam.   Psychiatric: Normal judgment/insight. Normal mood and affect. Oriented x3.    No results found for this or any previous visit (from the past 72 hour(s)).  Labs reviewed: CBC ok, CMP ok with mild reduced kidney function, TSH normal  ASSESSMENT/PLAN: Pt requests official referral to Ophtho for cataracts and Chiropractor. BP elevated but pt hasn't taken meds today, advised can RTC for nurse visit BP check and flu vaccine next week, please call for appointment. Also needs Medicare wellness exam, can do this next month.   Essential hypertension  Bilateral low back pain without sciatica - Plan: Ambulatory referral to Chiropractic, DG Lumbar Spine Complete  Bilateral knee pain - Plan: Ambulatory referral to Chiropractic, DG Knee Complete 4 Views Left, DG Knee Complete 4 Views Right  Cataract - Plan: Ambulatory referral to Ophthalmology  Viral URI  Osteoporosis - old Dexa reviewed, pt states doesn't want to be on the bisphosphonate therapy but is taking Ca and D   Return in about 1 week (around 06/02/2015) for NURSE VISIT - BP CHECK AND FLU SHOT. Follow up with Dr Sheppard Coil for Medicare Wellness in 1 - 2 mos.

## 2015-05-31 ENCOUNTER — Telehealth: Payer: Self-pay

## 2015-05-31 DIAGNOSIS — Z114 Encounter for screening for human immunodeficiency virus [HIV]: Secondary | ICD-10-CM | POA: Diagnosis not present

## 2015-05-31 DIAGNOSIS — Z1159 Encounter for screening for other viral diseases: Secondary | ICD-10-CM | POA: Diagnosis not present

## 2015-05-31 DIAGNOSIS — Z13220 Encounter for screening for lipoid disorders: Secondary | ICD-10-CM | POA: Diagnosis not present

## 2015-05-31 DIAGNOSIS — Z0000 Encounter for general adult medical examination without abnormal findings: Secondary | ICD-10-CM

## 2015-05-31 DIAGNOSIS — Z780 Asymptomatic menopausal state: Secondary | ICD-10-CM

## 2015-05-31 DIAGNOSIS — E039 Hypothyroidism, unspecified: Secondary | ICD-10-CM | POA: Diagnosis not present

## 2015-05-31 DIAGNOSIS — Z79899 Other long term (current) drug therapy: Secondary | ICD-10-CM

## 2015-05-31 MED ORDER — LEVOTHYROXINE SODIUM 50 MCG PO TABS: 50.0000 ug | ORAL_TABLET | Freq: Every day | ORAL | Status: DC

## 2015-05-31 NOTE — Telephone Encounter (Signed)
We can refill 30 days of Levothyroxine but no more wihtout being seen by me. I will leave lab orders for her to pick up when she comes back for BP check but she MUST get these done. Prefer these be done fasting to get accurate cholesterol and sugar measurements. Thanks.

## 2015-05-31 NOTE — Telephone Encounter (Signed)
Patient had labs done on 02/14/2015 at [facility] her results are through the care everywhere, she wants to know if she should repeat these labs or if she can wait on them. Patient has a CPE scheduled for next month. Please advise if patient needs to schedule and earlier appointment. This is concerning Levothyroxine medication, patient just ran out a few days ago.  Rhonda Cunningham,CMA

## 2015-06-01 LAB — LIPID PANEL
Cholesterol: 192 mg/dL (ref 125–200)
HDL: 57 mg/dL (ref >=46)
LDL CALC: 117 mg/dL (ref <130)
TRIGLYCERIDES: 89 mg/dL (ref <150)
Total CHOL/HDL Ratio: 3.4 Ratio (ref <=5.0)
VLDL: 18 mg/dL (ref <30)

## 2015-06-01 LAB — CBC WITH DIFFERENTIAL/PLATELET
Basophils Absolute: 0.1 10*3/uL (ref 0.0–0.1)
Basophils Relative: 1 % (ref 0–1)
EOS ABS: 0.2 10*3/uL (ref 0.0–0.7)
Eosinophils Relative: 3 % (ref 0–5)
HCT: 36.7 % (ref 36.0–46.0)
HEMOGLOBIN: 12.3 g/dL (ref 12.0–15.0)
LYMPHS ABS: 1.5 10*3/uL (ref 0.7–4.0)
Lymphocytes Relative: 27 % (ref 12–46)
MCH: 31.1 pg (ref 26.0–34.0)
MCHC: 33.5 g/dL (ref 30.0–36.0)
MCV: 92.9 fL (ref 78.0–100.0)
MONO ABS: 0.6 10*3/uL (ref 0.1–1.0)
MONOS PCT: 10 % (ref 3–12)
MPV: 9.3 fL (ref 8.6–12.4)
NEUTROS ABS: 3.2 10*3/uL (ref 1.7–7.7)
NEUTROS PCT: 59 % (ref 43–77)
PLATELETS: 293 10*3/uL (ref 150–400)
RBC: 3.95 MIL/uL (ref 3.87–5.11)
RDW: 12.9 % (ref 11.5–15.5)
WBC: 5.5 10*3/uL (ref 4.0–10.5)

## 2015-06-01 LAB — COMPLETE METABOLIC PANEL WITH GFR
ALBUMIN: 3.9 g/dL (ref 3.6–5.1)
ALK PHOS: 73 U/L (ref 33–130)
ALT: 13 U/L (ref 6–29)
AST: 20 U/L (ref 10–35)
BILIRUBIN TOTAL: 0.4 mg/dL (ref 0.2–1.2)
BUN: 25 mg/dL (ref 7–25)
CO2: 23 mmol/L (ref 20–31)
Calcium: 9.5 mg/dL (ref 8.6–10.4)
Chloride: 107 mmol/L (ref 98–110)
Creat: 1.32 mg/dL — ABNORMAL HIGH (ref 0.60–0.88)
GFR, EST AFRICAN AMERICAN: 44 mL/min — AB (ref >=60)
GFR, EST NON AFRICAN AMERICAN: 38 mL/min — AB (ref >=60)
GLUCOSE: 80 mg/dL (ref 65–99)
POTASSIUM: 4.7 mmol/L (ref 3.5–5.3)
SODIUM: 139 mmol/L (ref 135–146)
TOTAL PROTEIN: 6.6 g/dL (ref 6.1–8.1)

## 2015-06-01 LAB — TSH: TSH: 4.433 u[IU]/mL (ref 0.350–4.500)

## 2015-06-01 NOTE — Telephone Encounter (Signed)
Patient informed. Windsor Goeken,CMA  

## 2015-06-02 ENCOUNTER — Ambulatory Visit (INDEPENDENT_AMBULATORY_CARE_PROVIDER_SITE_OTHER): Payer: Commercial Managed Care - HMO | Admitting: Osteopathic Medicine

## 2015-06-02 VITALS — BP 176/77 | HR 71 | Temp 97.6°F | Ht 64.0 in | Wt 161.0 lb

## 2015-06-02 DIAGNOSIS — I10 Essential (primary) hypertension: Secondary | ICD-10-CM | POA: Diagnosis not present

## 2015-06-02 DIAGNOSIS — Z23 Encounter for immunization: Secondary | ICD-10-CM

## 2015-06-02 LAB — HEPATITIS C ANTIBODY: HCV AB: NEGATIVE

## 2015-06-02 LAB — VITAMIN D 25 HYDROXY (VIT D DEFICIENCY, FRACTURES): Vit D, 25-Hydroxy: 40 ng/mL (ref 30–100)

## 2015-06-02 LAB — HIV ANTIBODY (ROUTINE TESTING W REFLEX): HIV 1&2 Ab, 4th Generation: NONREACTIVE

## 2015-06-02 MED ORDER — CHLORTHALIDONE 25 MG PO TABS: 25.0000 mg | ORAL_TABLET | Freq: Every day | ORAL | Status: DC

## 2015-06-02 NOTE — Progress Notes (Signed)
   Subjective:    Patient ID: Laura Ellis, female    DOB: 07-Dec-1932, 82 y.o.   MRN: [id number]  HPI  Patient comes in for blood pressure recheck and flu shot   Review of Systems     Objective:   Physical Exam  BP 176/77 mmHg  Pulse 71  Temp(Src) 97.6 F (36.4 C) (Oral)  Ht 5\' 4"  (1.626 m)  Wt 161 lb (73.029 kg)  BMI 27.62 kg/m2       Assessment & Plan:   Patient's blood pressure was elevated and even checked her home unit which was 160/76 Notified provider and she added another medication for hypertension.  Patient will make an appointment to return in one week for a BP recheck.  Patient received flu injection in left upper arm, tolerated the injection well without complications.  Also has an appointment with the provider for a health physical on Feb. 13

## 2015-06-02 NOTE — Progress Notes (Signed)
Blood pressure elevated, her home blood pressure cuff is measuring slightly lower, per the nurse patient denies chest pain or shortness of breath. We'll add chlorthalidone and plan for patient to come back in 1 week for nurse visit blood pressure check. She is scheduled for a physical with me next month.

## 2015-06-10 ENCOUNTER — Ambulatory Visit: Payer: Commercial Managed Care - HMO

## 2015-06-13 ENCOUNTER — Ambulatory Visit (INDEPENDENT_AMBULATORY_CARE_PROVIDER_SITE_OTHER): Payer: Commercial Managed Care - HMO | Admitting: Osteopathic Medicine

## 2015-06-13 VITALS — BP 139/46 | HR 75 | Resp 16

## 2015-06-13 DIAGNOSIS — I10 Essential (primary) hypertension: Secondary | ICD-10-CM | POA: Diagnosis not present

## 2015-06-13 NOTE — Progress Notes (Signed)
Agree with nurse note, patient to follow-up in office with me.

## 2015-06-13 NOTE — Progress Notes (Signed)
   Subjective:    Patient ID: Laura Ellis, female    DOB: [birth year], 82 y.o.   MRN: [id number]  HPIHere for follow up on blood pressure. States has been wnl at home; taking [place] as ordered.    Review of Systems     Objective:   Physical ExamRepeated bp x2: 139/46, then 130/41.        Assessment & Plan:  Will keep appt. With Dr. Sheppard Coil later this month. pak

## 2015-06-20 DIAGNOSIS — M9903 Segmental and somatic dysfunction of lumbar region: Secondary | ICD-10-CM | POA: Diagnosis not present

## 2015-06-20 DIAGNOSIS — M9902 Segmental and somatic dysfunction of thoracic region: Secondary | ICD-10-CM | POA: Diagnosis not present

## 2015-06-20 DIAGNOSIS — M5137 Other intervertebral disc degeneration, lumbosacral region: Secondary | ICD-10-CM | POA: Diagnosis not present

## 2015-06-20 DIAGNOSIS — M9901 Segmental and somatic dysfunction of cervical region: Secondary | ICD-10-CM | POA: Diagnosis not present

## 2015-06-20 DIAGNOSIS — I973 Postprocedural hypertension: Secondary | ICD-10-CM | POA: Diagnosis not present

## 2015-06-27 ENCOUNTER — Ambulatory Visit: Payer: Commercial Managed Care - HMO | Admitting: Osteopathic Medicine

## 2015-07-05 ENCOUNTER — Ambulatory Visit (INDEPENDENT_AMBULATORY_CARE_PROVIDER_SITE_OTHER): Payer: Commercial Managed Care - HMO | Admitting: Osteopathic Medicine

## 2015-07-05 ENCOUNTER — Encounter: Payer: Self-pay | Admitting: Osteopathic Medicine

## 2015-07-05 VITALS — BP 154/79 | HR 80 | Ht 64.0 in | Wt 154.0 lb

## 2015-07-05 DIAGNOSIS — I10 Essential (primary) hypertension: Secondary | ICD-10-CM

## 2015-07-05 DIAGNOSIS — E039 Hypothyroidism, unspecified: Secondary | ICD-10-CM | POA: Diagnosis not present

## 2015-07-05 DIAGNOSIS — Z79899 Other long term (current) drug therapy: Secondary | ICD-10-CM

## 2015-07-05 DIAGNOSIS — Z0000 Encounter for general adult medical examination without abnormal findings: Secondary | ICD-10-CM

## 2015-07-05 DIAGNOSIS — M810 Age-related osteoporosis without current pathological fracture: Secondary | ICD-10-CM | POA: Diagnosis not present

## 2015-07-05 HISTORY — DX: Hypothyroidism, unspecified: E03.9

## 2015-07-05 MED ORDER — LEVOTHYROXINE SODIUM 50 MCG PO TABS: 50.0000 ug | ORAL_TABLET | Freq: Every day | ORAL | Status: DC

## 2015-07-05 NOTE — Patient Instructions (Signed)
Advance Directive Advance directives are the legal documents that allow you to make choices about your health care and medical treatment if you cannot speak for yourself. Advance directives are a way for you to communicate your wishes to family, friends, and health care providers. The specified people can then convey your decisions about end-of-life care to avoid confusion if you should become unable to communicate. Ideally, the process of discussing and writing advance directives should happen over time rather than making decisions all at once. Advance directives can be modified as your situation changes, and you can change your mind at any time, even after you have signed the advance directives. Each state has its own laws regarding advance directives. You may want to check with your health care provider, attorney, or state representative about the law in your state. Below are some examples of advance directives. LIVING WILL A living will is a set of instructions documenting your wishes about medical care when you cannot care for yourself. It is used if you become:  Terminally ill.  Incapacitated.  Unable to communicate.  Unable to make decisions. Items to consider in your living will include:  The use or non-use of life-sustaining equipment, such as dialysis machines and breathing machines (ventilators).  A do not resuscitate (DNR) order, which is the instruction not to use cardiopulmonary resuscitation (CPR) if breathing or heartbeat stops.  Tube feeding.  Withholding of food and fluids.  Comfort (palliative) care when the goal becomes comfort rather than a cure.  Organ and tissue donation. A living will does not give instructions about distribution of your money and property if you should pass away. It is advisable to seek the expert advice of a lawyer in drawing up a will regarding your possessions. Decisions about taxes, beneficiaries, and asset distribution will be legally binding.  This process can relieve your family and friends of any burdens surrounding disputes or questions that may come up about the allocation of your assets. DO NOT RESUSCITATE (DNR) A do not resuscitate (DNR) order is a request to not have CPR in the event that your heart stops beating or you stop breathing. Unless given other instructions, a health care provider will try to help any patient whose heart has stopped or who has stopped breathing.  HEALTH CARE PROXY AND DURABLE POWER OF ATTORNEY FOR HEALTH CARE A health care proxy is a person (agent) appointed to make medical decisions for you if you cannot. Generally, people choose someone they know well and trust to represent their preferences when they can no longer do so. You should be sure to ask this person for agreement to act as your agent. An agent may have to exercise judgment in the event of a medical decision for which your wishes are not known. The durable power of attorney for health care is the legal document that names your health care proxy. Once written, it should be:  Signed.  Notarized.  Dated.  Copied.  Witnessed.  Incorporated into your medical record. You may also want to appoint someone to manage your financial affairs if you cannot. This is called a durable power of attorney for finances. It is a separate legal document from the durable power of attorney for health care. You may choose the same person or someone different from your health care proxy to act as your agent in financial matters.   This information is not intended to replace advice given to you by your health care provider. Make sure you discuss any   questions you have with your health care provider.   Document Released: 08/07/2007 Document Revised: 05/05/2013 Document Reviewed: 09/17/2012 Elsevier Interactive Patient Education 2016 [employer]vier Inc.  

## 2015-07-05 NOTE — Progress Notes (Signed)
Subjective:    Laura Ellis is a 82 y.o. female who presents for Medicare Annual/Subsequent preventive examination.  Preventive Screening-Counseling & Management  Tobacco History  Smoking status  . Former Smoker  Smokeless tobacco  . Former Systems developer  . Quit date: 12/03/2001     Problems Prior to Visit Hypothyroid HTN Arthritis GERD  Current Problems (verified) Patient Active Problem List   Diagnosis Date Noted  . Osteoporosis 05/26/2015  . Cataract 05/26/2015  . Bilateral knee pain 05/26/2015  . Bilateral low back pain without sciatica 05/26/2015  . Essential hypertension 05/26/2015    Medications Prior to Visit Current Outpatient Prescriptions on File Prior to Visit  Medication Sig Dispense Refill  . Ascorbic Acid (VITAMIN C) 1000 MG tablet Take 1,000 mg by mouth daily.    [name] (BOSWELLIA PO) Take 250 mg by mouth. Patient takes 2 daily    . chlorthalidone (HYGROTON) 25 MG tablet Take 1 tablet (25 mg total) by mouth daily. 30 tablet 1  . Cholecalciferol (VITAMIN D PO) Take 1 tablet by mouth daily.    . Coenzyme Q10 (COQ10) 100 MG CAPS Take by mouth daily.    . Cyanocobalamin (VITAMIN B-12 PO) Take 1 tablet by mouth daily.    . famotidine (PEPCID AC) 10 MG chewable tablet Chew 10 mg by mouth 2 (two) times daily.    [name] ketotifen (THERA TEARS ALLERGY) 0.025 % ophthalmic solution Place 1 drop into both eyes 2 (two) times daily.    . Lactobacillus (PROBIOTIC ACIDOPHILUS) TABS Take by mouth daily.    [name] levothyroxine (SYNTHROID, LEVOTHROID) 50 MCG tablet Take 1 tablet (50 mcg total) by mouth daily before breakfast. NEED VISIT WITH DR. Alonni Heimsoth AND NEED LABS OR NO ADDITIONAL REFILLS 30 tablet 0  . loratadine (CLARITIN) 10 MG tablet Take 10 mg by mouth daily.    [name] losartan (COZAAR) 50 MG tablet Take 50 mg by mouth 2 (two) times daily.    . Magnesium Sulfate 70 MG CAPS Take by mouth.    . Multiple Vitamin (MULTIVITAMIN WITH MINERALS) TABS tablet Take 1 tablet by  mouth daily.    [name] NAPROXEN PO Take 1 tablet by mouth 2 (two) times daily.    . Omega-3 Fatty Acids (FISH OIL PO) Take 1 capsule by mouth daily.    [name] OVER THE COUNTER MEDICATION Take 1 tablet by mouth daily.    . pneumococcal 13-valent conjugate vaccine (PREVNAR 13) SUSP injection     . Potassium 99 MG TABS Take by mouth.    . Red Yeast Rice 600 MG CAPS Take 600 mg by mouth.    . Turmeric Curcumin 500 MG CAPS Take by mouth.    [name] HYDROcodone-acetaminophen (NORCO/VICODIN) 5-325 MG per tablet Take 1 tablet by mouth every 6 (six) hours as needed for moderate pain. (Patient not taking: Reported on 07/05/2015) 30 tablet 0  . Methylsulfonylmethane (MSM PO) Take 1 tablet by mouth 2 (two) times daily.     No current facility-administered medications on file prior to visit.    Current Medications (verified) Current Outpatient Prescriptions  Medication Sig Dispense Refill  . Ascorbic Acid (VITAMIN C) 1000 MG tablet Take 1,000 mg by mouth daily.    [name] (BOSWELLIA PO) Take 250 mg by mouth. Patient takes 2 daily    . chlorthalidone (HYGROTON) 25 MG tablet Take 1 tablet (25 mg total) by mouth daily. 30 tablet 1  . Cholecalciferol (VITAMIN D PO) Take 1 tablet by mouth daily.    [name]  Coenzyme Q10 (COQ10) 100 MG CAPS Take by mouth daily.    . Cyanocobalamin (VITAMIN B-12 PO) Take 1 tablet by mouth daily.    . famotidine (PEPCID AC) 10 MG chewable tablet Chew 10 mg by mouth 2 (two) times daily.    [name] ketotifen (THERA TEARS ALLERGY) 0.025 % ophthalmic solution Place 1 drop into both eyes 2 (two) times daily.    . Lactobacillus (PROBIOTIC ACIDOPHILUS) TABS Take by mouth daily.    [name] levothyroxine (SYNTHROID, LEVOTHROID) 50 MCG tablet Take 1 tablet (50 mcg total) by mouth daily before breakfast. NEED VISIT WITH DR. Tyshauna Finkbiner AND NEED LABS OR NO ADDITIONAL REFILLS 30 tablet 0  . loratadine (CLARITIN) 10 MG tablet Take 10 mg by mouth daily.    [name] losartan (COZAAR) 50 MG tablet Take 50 mg by mouth 2 (two)  times daily.    . Magnesium Sulfate 70 MG CAPS Take by mouth.    . Multiple Vitamin (MULTIVITAMIN WITH MINERALS) TABS tablet Take 1 tablet by mouth daily.    [name] NAPROXEN PO Take 1 tablet by mouth 2 (two) times daily.    . Omega-3 Fatty Acids (FISH OIL PO) Take 1 capsule by mouth daily.    [name] OVER THE COUNTER MEDICATION Take 1 tablet by mouth daily.    . pneumococcal 13-valent conjugate vaccine (PREVNAR 13) SUSP injection     . Potassium 99 MG TABS Take by mouth.    . Red Yeast Rice 600 MG CAPS Take 600 mg by mouth.    . Turmeric Curcumin 500 MG CAPS Take by mouth.    [name] HYDROcodone-acetaminophen (NORCO/VICODIN) 5-325 MG per tablet Take 1 tablet by mouth every 6 (six) hours as needed for moderate pain. (Patient not taking: Reported on 07/05/2015) 30 tablet 0  . Methylsulfonylmethane (MSM PO) Take 1 tablet by mouth 2 (two) times daily.     No current facility-administered medications for this visit.     Allergies (verified) Ace inhibitors; Alendronate sodium; Ezetimibe; Fenofibrate; Rosuvastatin calcium; and Statins   PAST HISTORY  Family History No family history on file.  Social History Social History  Substance Use Topics  . Smoking status: Former Research scientist (life sciences)  . Smokeless tobacco: Former Systems developer    Quit date: 12/03/2001  . Alcohol Use: Yes     Comment: occ     Are there smokers in your home (other than you)? Yes  Risk Factors Current exercise habits: The patient does not participate in regular exercise at present.  Dietary issues discussed: yes   Cardiac risk factors: advanced age (older than 65 for men, 85 for women), dyslipidemia, hypertension, sedentary lifestyle and smoking/ tobacco exposure.  Depression Screen (Note: if answer to either of the following is "Yes", a more complete depression screening is indicated)   Over the past two weeks, have you felt down, depressed or hopeless? No  Over the past two weeks, have you felt little interest or pleasure in doing things? No  Have  you lost interest or pleasure in daily life? No  Do you often feel hopeless? No  Do you cry easily over simple problems? No  Activities of Daily Living In your present state of health, do you have any difficulty performing the following activities?:  Driving? No Managing money?  No Feeding yourself? No Getting from bed to chair? No Climbing a flight of stairs? No Preparing food and eating?: No Bathing or showering? No Getting dressed: No Getting to the toilet? No Using the toilet:No Moving around from place to  place: No In the past year have you fallen or had a near fall?:No   Are you sexually active?  No  Do you have more than one partner?  No  Hearing Difficulties: Yes - been seen by ENT, following up in 12/2015 Do you often ask people to speak up or repeat themselves? Yes Do you experience ringing or noises in your ears? Yes Do you have difficulty understanding soft or whispered voices? No Memory Issues:   Do you feel that you have a problem with memory? No  Do you often misplace items? No  Do you feel safe at home?  Yes Cognitive Testing  Alert? Yes  Normal Appearance?Yes  Oriented to person? Yes  Place? Yes   Time? Yes  Displays appropriate judgment?Yes  Can read the correct time from a watch face?Yes   Advanced Directives have been discussed with the patient? Yes      List the Names of Other Physician/Practitioners you currently use: 1.  Chiropractor: Dr Salome Spotted any recent Medical Services you may have received from other than [facility] providers in the past year (date may be approximate). Chiropractor  Immunization History  Administered Date(s) Administered  . Influenza,inj,Quad PF,36+ Mos 06/02/2015  . Pneumococcal Conjugate-13 03/24/2014  . Pneumococcal Polysaccharide-23 02/13/2012  . Tdap 11/19/2013  . Zoster 03/25/2009    Screening Tests Health Maintenance  Topic Date Due  . INFLUENZA VACCINE  12/13/2015  . TETANUS/TDAP  11/20/2023  . DEXA  SCAN  Addressed  . ZOSTAVAX  Completed  . PNA vac Low Risk Adult  Completed    All answers were reviewed with the patient and necessary referrals were made:   Emeterio Reeve, DO   07/05/2015   History reviewed: allergies, current medications, past family history, past medical history, past social history, past surgical history and problem list  Review of Systems Pertinent items are noted in HPI.    Objective:     Vision by Snellen chart: right [id number] declines measurement, left [id number] declines measurement  Body mass index is 26.42 kg/(m^2). BP 154/79 mmHg  Pulse 80  Ht 5\' 4"  (1.626 m)  Wt 154 lb (69.854 kg)  BMI 26.42 kg/m2   Cardiac: RRR, S1S2 normal Lung: CTAB   Assessment:     Routine history and physical examination of adult  Essential hypertension  Hypothyroidism, unspecified hypothyroidism type - Plan: levothyroxine (SYNTHROID, LEVOTHROID) 50 MCG tablet  Osteoporosis  Medication management       Plan:     During the course of the visit the patient was educated and counseled about appropriate screening and preventive services including:    Nutrition counseling   Advanced directives: has NO advanced directive  - add't info requested. Referral to SW: not applicable  Diet review for nutrition referral? Yes ____  Not Indicated __X__   Patient Instructions (the written plan) was given to the patient.  Medicare Attestation I have personally reviewed:  The patient's medical and social history Their use of alcohol, tobacco or illicit drugs Their current medications and supplements The patient's functional ability including ADLs,fall risks, home safety risks, cognitive, and hearing and visual impairment Diet and physical activities Evidence for depression or mood disorders  The patient's weight, height, BMI, and visual acuity have been recorded in the chart.  I have made referrals, counseling, and provided education to the patient based on  review of the above and I have provided the patient with a written personalized care plan for preventive services.  Emeterio Reeve, DO   07/05/2015

## 2015-07-19 DIAGNOSIS — H2511 Age-related nuclear cataract, right eye: Secondary | ICD-10-CM | POA: Diagnosis not present

## 2015-07-19 DIAGNOSIS — H2512 Age-related nuclear cataract, left eye: Secondary | ICD-10-CM | POA: Diagnosis not present

## 2015-07-19 DIAGNOSIS — H18411 Arcus senilis, right eye: Secondary | ICD-10-CM | POA: Diagnosis not present

## 2015-07-19 DIAGNOSIS — H02839 Dermatochalasis of unspecified eye, unspecified eyelid: Secondary | ICD-10-CM | POA: Diagnosis not present

## 2015-07-21 DIAGNOSIS — M9902 Segmental and somatic dysfunction of thoracic region: Secondary | ICD-10-CM | POA: Diagnosis not present

## 2015-07-21 DIAGNOSIS — M9901 Segmental and somatic dysfunction of cervical region: Secondary | ICD-10-CM | POA: Diagnosis not present

## 2015-07-21 DIAGNOSIS — M5137 Other intervertebral disc degeneration, lumbosacral region: Secondary | ICD-10-CM | POA: Diagnosis not present

## 2015-07-21 DIAGNOSIS — I973 Postprocedural hypertension: Secondary | ICD-10-CM | POA: Diagnosis not present

## 2015-07-21 DIAGNOSIS — M9903 Segmental and somatic dysfunction of lumbar region: Secondary | ICD-10-CM | POA: Diagnosis not present

## 2015-08-18 DIAGNOSIS — M5137 Other intervertebral disc degeneration, lumbosacral region: Secondary | ICD-10-CM | POA: Diagnosis not present

## 2015-08-18 DIAGNOSIS — I973 Postprocedural hypertension: Secondary | ICD-10-CM | POA: Diagnosis not present

## 2015-08-18 DIAGNOSIS — M9902 Segmental and somatic dysfunction of thoracic region: Secondary | ICD-10-CM | POA: Diagnosis not present

## 2015-08-18 DIAGNOSIS — M9903 Segmental and somatic dysfunction of lumbar region: Secondary | ICD-10-CM | POA: Diagnosis not present

## 2015-08-18 DIAGNOSIS — M9901 Segmental and somatic dysfunction of cervical region: Secondary | ICD-10-CM | POA: Diagnosis not present

## 2015-09-12 DIAGNOSIS — H2511 Age-related nuclear cataract, right eye: Secondary | ICD-10-CM | POA: Diagnosis not present

## 2015-09-12 DIAGNOSIS — H25811 Combined forms of age-related cataract, right eye: Secondary | ICD-10-CM | POA: Diagnosis not present

## 2015-09-13 DIAGNOSIS — H2512 Age-related nuclear cataract, left eye: Secondary | ICD-10-CM | POA: Diagnosis not present

## 2015-10-03 DIAGNOSIS — Z961 Presence of intraocular lens: Secondary | ICD-10-CM | POA: Diagnosis not present

## 2015-10-03 DIAGNOSIS — H2512 Age-related nuclear cataract, left eye: Secondary | ICD-10-CM | POA: Diagnosis not present

## 2015-10-03 DIAGNOSIS — H25812 Combined forms of age-related cataract, left eye: Secondary | ICD-10-CM | POA: Diagnosis not present

## 2015-10-25 ENCOUNTER — Other Ambulatory Visit: Payer: Self-pay

## 2015-10-25 MED ORDER — LOSARTAN POTASSIUM 50 MG PO TABS: 50.0000 mg | ORAL_TABLET | Freq: Two times a day (BID) | ORAL | Status: DC

## 2015-10-25 NOTE — Telephone Encounter (Signed)
Is it ok to fill losartan? You've never filled it before.  Is this the correct dose?

## 2015-10-26 ENCOUNTER — Telehealth: Payer: Self-pay | Admitting: *Deleted

## 2015-10-26 NOTE — Telephone Encounter (Signed)
Pt called and states rx was not at the pharm. It was sent but looks like the transmission failed. Called rx into [facility]. Pt aware

## 2015-12-05 ENCOUNTER — Encounter: Payer: Self-pay | Admitting: Osteopathic Medicine

## 2015-12-05 ENCOUNTER — Ambulatory Visit (INDEPENDENT_AMBULATORY_CARE_PROVIDER_SITE_OTHER): Payer: Commercial Managed Care - HMO | Admitting: Osteopathic Medicine

## 2015-12-05 ENCOUNTER — Ambulatory Visit (INDEPENDENT_AMBULATORY_CARE_PROVIDER_SITE_OTHER): Payer: Commercial Managed Care - HMO

## 2015-12-05 VITALS — BP 150/80 | HR 78 | Ht 63.0 in | Wt 153.0 lb

## 2015-12-05 DIAGNOSIS — Z6379 Other stressful life events affecting family and household: Secondary | ICD-10-CM | POA: Diagnosis not present

## 2015-12-05 DIAGNOSIS — E039 Hypothyroidism, unspecified: Secondary | ICD-10-CM | POA: Diagnosis not present

## 2015-12-05 DIAGNOSIS — I10 Essential (primary) hypertension: Secondary | ICD-10-CM | POA: Diagnosis not present

## 2015-12-05 DIAGNOSIS — M25552 Pain in left hip: Secondary | ICD-10-CM

## 2015-12-05 MED ORDER — CHLORTHALIDONE 25 MG PO TABS: 25.0000 mg | ORAL_TABLET | Freq: Every day | ORAL | 1 refills | Status: DC

## 2015-12-05 NOTE — Progress Notes (Signed)
HPI: Laura Ellis is a 82 y.o. Not Hispanic or Latino female  who presents to [facility] today, 12/05/15,  for chief complaint of:  Chief Complaint  Patient presents with  . Follow-up    BLOOD PRESSURE AND THYROID     Blood pressure: Pressure is a bit high today, was previously normal, patient states that she has not been taking the chlorthalidone as noted below but she is compliant with the losartan. Reports a lot of stress at home, has been recently diagnosed with lung cancer and a lot has been going on with him, thinks she just forgot to refill the other medication  Thyroid: Taking medication consistently, due for six-month recheck.  Musculoskeletal: Patient reports ongoing pain in left lumbar spine, left hip, left knee. Reports the hip has gotten worse to the point where when she touches that it's a bit tender. Has previously been seeing chiropractic for it. Also on some herbal medications as noted below.  Other: Husband's recent cancer diagnosis as noted above, patient has a daughter who lives locally and is close to home and is helping out when patient needs a break to go get things done. Reports some stress with husband's diagnosis but overall coping okay, they're anxious to know biopsy results    Past medical, surgical, social and family history reviewed: Past Medical History:  Diagnosis Date  . Arthritis   . Hypertension   . Osteopenia    Past Surgical History:  Procedure Laterality Date  . ABDOMINAL HYSTERECTOMY    . KNEE ARTHROSCOPY    . VARICOSE VEIN SURGERY     Social History  Substance Use Topics  . Smoking status: Former Research scientist (life sciences)  . Smokeless tobacco: Former Systems developer    Quit date: 12/03/2001  . Alcohol use Yes     Comment: occ   No family history on file.   Current medication list and allergy/intolerance information reviewed:   Current Outpatient Prescriptions  Medication Sig Dispense Refill  . Ascorbic Acid (VITAMIN C)  1000 MG tablet Take 1,000 mg by mouth daily.    [name] (BOSWELLIA PO) Take 250 mg by mouth. Patient takes 2 daily    . chlorthalidone (HYGROTON) 25 MG tablet Take 1 tablet (25 mg total) by mouth daily. 30 tablet 1  . Cholecalciferol (VITAMIN D PO) Take 1 tablet by mouth daily.    . Coenzyme Q10 (COQ10) 100 MG CAPS Take by mouth daily.    . Cyanocobalamin (VITAMIN B-12 PO) Take 1 tablet by mouth daily.    . Lactobacillus (PROBIOTIC ACIDOPHILUS) TABS Take by mouth daily.    [name] levothyroxine (SYNTHROID, LEVOTHROID) 50 MCG tablet Take 1 tablet (50 mcg total) by mouth daily before breakfast. 90 tablet 1  . losartan (COZAAR) 50 MG tablet Take 1 tablet (50 mg total) by mouth 2 (two) times daily. 180 tablet 2  . Magnesium Sulfate 70 MG CAPS Take by mouth.    . Methylsulfonylmethane (MSM PO) Take 1 tablet by mouth 2 (two) times daily.    . Multiple Vitamin (MULTIVITAMIN WITH MINERALS) TABS tablet Take 1 tablet by mouth daily.    [name] NAPROXEN PO Take 1 tablet by mouth 2 (two) times daily.    . Omega-3 Fatty Acids (FISH OIL PO) Take 1 capsule by mouth daily.    [name] OVER THE COUNTER MEDICATION Take 1 tablet by mouth daily.    . pneumococcal 13-valent conjugate vaccine (PREVNAR 13) SUSP injection     . Potassium 99  MG TABS Take by mouth.    . Red Yeast Rice 600 MG CAPS Take 600 mg by mouth.    . Turmeric Curcumin 500 MG CAPS Take by mouth.     No current facility-administered medications for this visit.    Allergies  Allergen Reactions  . Ace Inhibitors     Cough  . Alendronate Sodium     esophagitis  . Ezetimibe   . Fenofibrate     arthralgia  . Rosuvastatin Calcium   . Statins     Muscle aches      Review of Systems:  Constitutional:  No  fever, no chills, No recent illness, No unintentional weight changes. No significant fatigue.   HEENT: No  headache, no vision change,  Cardiac: No  chest pain, No  pressure  Respiratory:  No  shortness of breath. No  Cough    Musculoskeletal: No new myalgia/arthralgia Except as noted in history of present illness  Skin: No  Rash, No other wounds/concerning lesions  Neurologic: No  weakness, No  dizziness,   Exam:  BP (!) 152/77   Pulse 78   Ht 5\' 3"  (1.6 m)   Wt 153 lb (69.4 kg)   BMI 27.10 kg/m   Constitutional: VS see above. General Appearance: alert, well-developed, well-nourished, NAD  Ears, Nose, Mouth, Throat: MMM, Normal external inspection ears/nares/mouth/lips/gums.   Neck: No masses, trachea midline.   Respiratory: Normal respiratory effort. no wheeze, no rhonchi, no rales  Cardiovascular: S1/S2 normal, no murmur, no rub/gallop auscultated. RRR. No lower extremity edema.  Musculoskeletal: Gait normal. No clubbing/cyanosis of digits positive tenderness to palpation of greater trochanter on left, positive crepitus to left knee.   Neurological: No cranial nerve deficit on limited exam. Motor and sensation intact and symmetric. Cerebellar reflexes grossly intact. Normal balance/coordination. No tremor.   Skin: warm, dry, intact. No rash/ulcer.  Psychiatric: Normal judgment/insight. Normal mood and affect. Oriented x3.    No results found for this or any previous visit (from the past 72 hour(s)).  Dg Hip Unilat W Or W/o Pelvis 2-3 Views Left  Result Date: 12/05/2015 CLINICAL DATA:  82 year old female with history of left posterior hip pain for the past 6 months. No known injury. EXAM: DG HIP (WITH OR WITHOUT PELVIS) 2-3V LEFT COMPARISON:  No priors. FINDINGS: AP view of the bony pelvis demonstrates no acute displaced fracture of the bony pelvic ring. Bilateral proximal femurs as visualized appear intact. Left femoral head is properly located. There is some mild joint space narrowing, subchondral sclerosis and osteophyte formation in the hip joints bilaterally, compatible with mild osteoarthritis. Numerous pelvic phleboliths are incidentally noted. IMPRESSION: 1. No acute radiographic  abnormality of the bony pelvis or the left hip. Electronically Signed   By: Vinnie Langton M.D.   On: 12/05/2015 16:02    ASSESSMENT/PLAN:   X-rays consistent with osteoarthritis though bursitis is certainly a consideration as well, advised patient that she should think about setting up a visit with Dr. Georgina Snell or Dr. Darene Lamer, could consider injection if they feel that is appropriate versus other therapy  Restart chlorthalidone, patient will be coming back in the next 2 weeks with her husband, advised we can recheck blood pressure at that point  Labs as below for thyroid. Recheck kidney function on diuretic medication  Essential hypertension - Plan: COMPLETE METABOLIC PANEL WITH GFR, chlorthalidone (HYGROTON) 25 MG tablet  Hypothyroidism, unspecified hypothyroidism type - Plan: TSH  Left hip pain - Plan: DG HIP UNILAT W OR W/O  PELVIS 2-3 VIEWS LEFT  Stress due to illness of family member     Visit summary with medication list and pertinent instructions was printed for patient to review. All questions at time of visit were answered - patient instructed to contact office with any additional concerns. ER/RTC precautions were reviewed with the patient. Follow-up plan: Return in about 2 weeks (around 12/19/2015) for Blood pressure recheck (can recheck when she is here with her husband at nurse visit or I can do it .  Note: Total time spent 25 minutes, greater than 50% of the visit was spent face-to-face counseling and coordinating care for the following: The primary encounter diagnosis was Essential hypertension. Diagnoses of Hypothyroidism, unspecified hypothyroidism type, Left hip pain, and Stress due to illness of family member were also pertinent to this visit.[name]

## 2015-12-06 LAB — COMPLETE METABOLIC PANEL WITH GFR
ALBUMIN: 4.2 g/dL (ref 3.6–5.1)
ALK PHOS: 54 U/L (ref 33–130)
ALT: 12 U/L (ref 6–29)
AST: 22 U/L (ref 10–35)
BUN: 30 mg/dL — ABNORMAL HIGH (ref 7–25)
CALCIUM: 9.5 mg/dL (ref 8.6–10.4)
CO2: 26 mmol/L (ref 20–31)
Chloride: 105 mmol/L (ref 98–110)
Creat: 1.49 mg/dL — ABNORMAL HIGH (ref 0.60–0.88)
GFR, EST NON AFRICAN AMERICAN: 33 mL/min — AB (ref >=60)
GFR, Est African American: 38 mL/min — ABNORMAL LOW (ref >=60)
GLUCOSE: 74 mg/dL (ref 65–99)
POTASSIUM: 4.4 mmol/L (ref 3.5–5.3)
Sodium: 143 mmol/L (ref 135–146)
Total Bilirubin: 0.4 mg/dL (ref 0.2–1.2)
Total Protein: 6.5 g/dL (ref 6.1–8.1)

## 2015-12-06 LAB — TSH: TSH: 1.88 m[IU]/L

## 2016-01-17 ENCOUNTER — Ambulatory Visit (INDEPENDENT_AMBULATORY_CARE_PROVIDER_SITE_OTHER): Payer: Commercial Managed Care - HMO | Admitting: Family Medicine

## 2016-01-17 ENCOUNTER — Ambulatory Visit (INDEPENDENT_AMBULATORY_CARE_PROVIDER_SITE_OTHER): Payer: Commercial Managed Care - HMO

## 2016-01-17 ENCOUNTER — Encounter: Payer: Self-pay | Admitting: Family Medicine

## 2016-01-17 VITALS — BP 152/63 | HR 94 | Temp 98.1°F | Resp 18 | Wt 146.1 lb

## 2016-01-17 DIAGNOSIS — M25472 Effusion, left ankle: Secondary | ICD-10-CM

## 2016-01-17 DIAGNOSIS — M25572 Pain in left ankle and joints of left foot: Secondary | ICD-10-CM

## 2016-01-17 DIAGNOSIS — M25562 Pain in left knee: Secondary | ICD-10-CM | POA: Diagnosis not present

## 2016-01-17 DIAGNOSIS — M25561 Pain in right knee: Secondary | ICD-10-CM | POA: Diagnosis not present

## 2016-01-17 DIAGNOSIS — M7062 Trochanteric bursitis, left hip: Secondary | ICD-10-CM

## 2016-01-17 DIAGNOSIS — M7989 Other specified soft tissue disorders: Secondary | ICD-10-CM | POA: Diagnosis not present

## 2016-01-17 NOTE — Progress Notes (Signed)
Pt is having left hip, knee and ankle pain.

## 2016-01-17 NOTE — Progress Notes (Signed)
Laura Ellis is a 82 y.o. female who presents to [facility]: [facility] today for left lateral hip pain, knee pain, and left ankle pain.  Left lateral hip pain: Ongoing for years. 2 years ago patient had some benefit with physical therapy. She notes the pain is present in the left lateral hip worse with activity better with rest. She denies any specific injury. She takes naproxen which helps some.  Left knee pain: Patient notes ongoing left lateral knee pain. She denies any injury. She takes naproxen which helps. This is been present for a few years. She notes she's had x-rays that show arthritis in the past.  Left ankle Pain: Ongoing for years as well. Patient has pain is in the anterior ankle worse with activity better with rest. She's tried some naproxen which helps a bit.   Past Medical History:  Diagnosis Date  . Arthritis   . Hypertension   . Osteopenia    Past Surgical History:  Procedure Laterality Date  . ABDOMINAL HYSTERECTOMY    . KNEE ARTHROSCOPY    . VARICOSE VEIN SURGERY     Social History  Substance Use Topics  . Smoking status: Former Research scientist (life sciences)  . Smokeless tobacco: Former Systems developer    Quit date: 12/03/2001  . Alcohol use Yes     Comment: occ   family history is not on file.  ROS as above:  Medications: Current Outpatient Prescriptions  Medication Sig Dispense Refill  . Ascorbic Acid (VITAMIN C) 1000 MG tablet Take 1,000 mg by mouth daily.    [name] (BOSWELLIA PO) Take 250 mg by mouth. Patient takes 2 daily    . chlorthalidone (HYGROTON) 25 MG tablet Take 1 tablet (25 mg total) by mouth daily. 90 tablet 1  . Cholecalciferol (VITAMIN D PO) Take 1 tablet by mouth daily.    . Coenzyme Q10 (COQ10) 100 MG CAPS Take by mouth daily.    . Cyanocobalamin (VITAMIN B-12 PO) Take 1 tablet by mouth daily.    . Lactobacillus (PROBIOTIC ACIDOPHILUS) TABS  Take by mouth daily.    [name] levothyroxine (SYNTHROID, LEVOTHROID) 50 MCG tablet Take 1 tablet (50 mcg total) by mouth daily before breakfast. 90 tablet 1  . losartan (COZAAR) 50 MG tablet Take 1 tablet (50 mg total) by mouth 2 (two) times daily. 180 tablet 2  . Magnesium Sulfate 70 MG CAPS Take by mouth.    . Methylsulfonylmethane (MSM PO) Take 1 tablet by mouth 2 (two) times daily.    . Multiple Vitamin (MULTIVITAMIN WITH MINERALS) TABS tablet Take 1 tablet by mouth daily.    [name] NAPROXEN PO Take 1 tablet by mouth 2 (two) times daily.    . Omega-3 Fatty Acids (FISH OIL PO) Take 1 capsule by mouth daily.    [name] OVER THE COUNTER MEDICATION Take 1 tablet by mouth daily.    . pneumococcal 13-valent conjugate vaccine (PREVNAR 13) SUSP injection     . Potassium 99 MG TABS Take by mouth.    . Red Yeast Rice 600 MG CAPS Take 600 mg by mouth.    . Turmeric Curcumin 500 MG CAPS Take by mouth.     No current facility-administered medications for this visit.    Allergies  Allergen Reactions  . Ace Inhibitors     Cough  . Alendronate Sodium     esophagitis  . Ezetimibe   . Fenofibrate     arthralgia  .  Rosuvastatin Calcium   . Statins     Muscle aches     Exam:  BP (!) 152/63 (BP Location: Right Arm, Patient Position: Sitting, Cuff Size: Normal)   Pulse 94   Temp 98.1 F (36.7 C) (Oral)   Resp 18   Wt 146 lb 1.3 oz (66.3 kg)   SpO2 97%   BMI 25.88 kg/m  Gen: Well NAD L spine: Nontender to midline. Normal back motion. Left hip: Tender to palpation left lateral hip. Normal motion. Decreased abduction strength Left knee normal-appearing no effusion. Mildly tender to palpation lateral joint line. Stable ligamentous exam intact extension and flexion strength. Left ankle normal-appearing nontender normal motions stable ligamentous exam.  Procedure: Real-time Ultrasound Guided Injection of Left Knee  Device: GE Logiq E  Images permanently stored and available for review in the ultrasound  unit. Verbal informed consent obtained. Discussed risks and benefits of procedure. Warned about infection bleeding damage to structures skin hypopigmentation and fat atrophy among others. Patient expresses understanding and agreement Time-out conducted.  Noted no overlying erythema, induration, or other signs of local infection.  Skin prepped in a sterile fashion.  Local anesthesia: Topical Ethyl chloride.  With sterile technique and under real time ultrasound guidance: 80 mg of Kenalog and 4 mL of Marcaine injected easily.  Completed without difficulty  Pain immediately resolved suggesting accurate placement of the medication.  Advised to call if fevers/chills, erythema, induration, drainage, or persistent bleeding.  Images permanently stored and available for review in the ultrasound unit.  Impression: Technically successful ultrasound guided injection.   Lot number: Marcaine: [id number] Kenalog: [id number]   X-ray reviewed: CLINICAL DATA:  82 year old female with history of left posterior hip pain for the past 6 months. No known injury. EXAM: DG HIP (WITH OR WITHOUT PELVIS) 2-3V LEFT COMPARISON:  No priors. FINDINGS: AP view of the bony pelvis demonstrates no acute displaced fracture of the bony pelvic ring. Bilateral proximal femurs as visualized appear intact. Left femoral head is properly located. There is some mild joint space narrowing, subchondral sclerosis and osteophyte formation in the hip joints bilaterally, compatible with mild osteoarthritis. Numerous pelvic phleboliths are incidentally noted. IMPRESSION: 1. No acute radiographic abnormality of the bony pelvis or the left hip. Electronically Signed   By: Vinnie Langton M.D.   On: 12/05/2015 16:02   CLINICAL DATA:  Low back pain with LEFT radiculopathy for years, BILATERAL knee pain for years, no injury  EXAM: LEFT KNEE - COMPLETE 4+ VIEW  COMPARISON:  None  FINDINGS: Bones appear  demineralized.  Minimal joint space narrowing.  Scattered chondrocalcinosis.  No acute fracture, dislocation, or bone destruction.  Knee joint effusion present.  Serpiginous soft tissue densities at the medial leg anteriorly likely reflect venous varicosities.  Nonspecific soft tissue calcification anteriorly at proximal LEFT lower leg medially.  IMPRESSION: Osseous demineralization with chondrocalcinosis question CPPD.  Probable joint effusion without acute bony abnormalities.  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   Electronically Signed   By: Lavonia Dana M.D.   On: 05/26/2015 16:37   No results found for this or any previous visit (from the past 24 hour(s)). No results found.    Assessment and Plan: 82 y.o. female with  Left lateral hip pain concerning for a greater trochanteric bursitis. Plan for physical therapy and recheck in 4 weeks.  Left knee: DJD versus CPPD versus both. Steroid injection today. Recheck in 4 weeks  Left ankle pain: Unclear etiology. X-ray pending. Recheck 4 weeks.     Orders Placed  This Encounter  Procedures  . DG Ankle Complete Left    Standing Status:   Future    Number of Occurrences:   1    Standing Expiration Date:   03/18/2017    Order Specific Question:   Reason for Exam (SYMPTOM  OR DIAGNOSIS REQUIRED)    Answer:   left ankle pain    Order Specific Question:   Preferred imaging location?    Answer:   [facility]  . Ambulatory referral to Physical Therapy    Referral Priority:   Routine    Referral Type:   Physical Medicine    Referral Reason:   Specialty Services Required    Requested Specialty:   Physical Therapy    Number of Visits Requested:   1    Discussed warning signs or symptoms. Please see discharge instructions. Patient expresses understanding.

## 2016-01-17 NOTE — Patient Instructions (Signed)
Thank you for coming in today. Attend PT.  Return in 4 weeks or so.    Trochanteric Bursitis You have hip pain due to trochanteric bursitis. Bursitis means that the sack near the outside of the hip is filled with fluid and inflamed. This sack is made up of protective soft tissue. The pain from trochanteric bursitis can be severe and keep you from sleep. It can radiate to the buttocks or down the outside of the thigh to the knee. The pain is almost always worse when rising from the seated or lying position and with walking. Pain can improve after you take a few steps. It happens more often in people with hip joint and lumbar spine problems, such as arthritis or previous surgery. Very rarely the trochanteric bursa can become infected, and antibiotics and/or surgery may be needed. Treatment often includes an injection of local anesthetic mixed with cortisone medicine. This medicine is injected into the area where it is most tender over the hip. Repeat injections may be necessary if the response to treatment is slow. You can apply ice packs over the tender area for 30 minutes every 2 hours for the next few days. Anti-inflammatory and/or narcotic pain medicine may also be helpful. Limit your activity for the next few days if the pain continues. See your caregiver in 5-10 days if you are not greatly improved.  SEEK IMMEDIATE MEDICAL CARE IF:  You develop severe pain, fever, or increased redness.  You have pain that radiates below the knee. EXERCISES STRETCHING EXERCISES - Trochanteric Bursitis  These exercises may help you when beginning to rehabilitate your injury. Your symptoms may resolve with or without further involvement from your physician, physical therapist, or athletic trainer. While completing these exercises, remember:   Restoring tissue flexibility helps normal motion to return to the joints. This allows healthier, less painful movement and activity.  An effective stretch should be held for  at least 30 seconds.  A stretch should never be painful. You should only feel a gentle lengthening or release in the stretched tissue. STRETCH - Iliotibial Band  On the floor or bed, lie on your side so your injured leg is on top. Bend your knee and grab your ankle.  Slowly bring your knee back so that your thigh is in line with your trunk. Keep your heel at your buttocks and gently arch your back so your head, shoulders and hips line up.  Slowly lower your leg so that your knee approaches the floor/bed until you feel a gentle stretch on the outside of your thigh. If you do not feel a stretch and your knee will not fall farther, place the heel of your opposite foot on top of your knee and pull your thigh down farther.  Hold this stretch for __________ seconds.  Repeat __________ times. Complete this exercise __________ times per day. STRETCH - Hamstrings, Supine   Lie on your back. Loop a belt or towel over the ball of your foot as shown.  Straighten your knee and slowly pull on the belt to raise your injured leg. Do not allow the knee to bend. Keep your opposite leg flat on the floor.  Raise the leg until you feel a gentle stretch behind your knee or thigh. Hold this position for __________ seconds.  Repeat __________ times. Complete this stretch __________ times per day. STRETCH - Quadriceps, Prone   Lie on your stomach on a firm surface, such as a bed or padded floor.  Bend your knee  and grasp your ankle. If you are unable to reach your ankle or pant leg, use a belt around your foot to lengthen your reach.  Gently pull your heel toward your buttocks. Your knee should not slide out to the side. You should feel a stretch in the front of your thigh and/or knee.  Hold this position for __________ seconds.  Repeat __________ times. Complete this stretch __________ times per day. STRETCHING - Hip Flexors, Lunge Half kneel with your knee on the floor and your opposite knee bent and  directly over your ankle.  Keep good posture with your head over your shoulders. Tighten your buttocks to point your tailbone downward; this will prevent your back from arching too much.  You should feel a gentle stretch in the front of your thigh and/or hip. If you do not feel any resistance, slightly slide your opposite foot forward and then slowly lunge forward so your knee once again lines up over your ankle. Be sure your tailbone remains pointed downward.  Hold this stretch for __________ seconds.  Repeat __________ times. Complete this stretch __________ times per day. STRETCH - Adductors, Lunge  While standing, spread your legs.  Lean away from your injured leg by bending your opposite knee. You may rest your hands on your thigh for balance.  You should feel a stretch in your inner thigh. Hold for __________ seconds.  Repeat __________ times. Complete this exercise __________ times per day.   This information is not intended to replace advice given to you by your health care provider. Make sure you discuss any questions you have with your health care provider.   Document Released: 06/07/2004 Document Revised: 09/14/2014 Document Reviewed: 08/12/2008 Elsevier Interactive Patient Education 2016 [employer].   Calcium Pyrophosphate Deposition  Calcium pyrophosphate deposition (CPPD), which is also called pseudogout, is a type of arthritis that causes pain, swelling, and inflammation in a joint. The joint pain can be severe and may last for days. If it is not treated, the pain may last much longer. Attacks of CPPD may come and go. This condition usually affects one joint at a time. The joints that are affected most commonly are the knees, but this condition can also affect the wrists, elbows, shoulders, or ankles. CPPD is similar to gout. Both conditions result from the buildup of crystals in the joint. However, CPPD is caused by a type of crystal that is different than the crystals  that cause gout. CAUSES This condition is caused by the buildup of calcium pyrophosphate dihydrate crystals in the joint. The reason why this buildup occurs is not known. The condition may be passed down from parent to child (hereditary). RISK FACTORS This condition is more likely to develop in people who:  Are over 57 years old.  Have a family history of the condition.  Have had joint replacement surgery.  Have had a recent injury.  Have certain medical conditions, such as hemophilia, ochronosis, amyloidosis, or hormonal disorders.  Have low blood magnesium levels. SYMPTOMS Symptoms of this condition include:  Pain in a joint. The pain may:  Be intense and constant.  Come on quickly.  Get worse with movement.  Last from several days to a few weeks.  Redness, swelling, and warmth at the joint.  Stiffness of the joint. DIAGNOSIS To diagnose this condition, your health care provider will use a needle to remove fluid from the joint. The fluid will be examined under a microscope to check for the crystals that cause CPPD.  You may also have imaging tests, such as:  X-rays.  Ultrasound. TREATMENT There is no way to remove the crystals from the joint and no way to cure this condition. However, treatment can relieve symptoms and improve joint function. Treatment may include:  Nonsteroidal anti-inflammatory drugs (NSAIDs) to reduce inflammation and pain.  Medicines to help prevent attacks.  Injections of medicine (cortisone) into the joint to reduce pain and swelling.  Physical therapy to improve joint function. HOME CARE INSTRUCTIONS  Take medicines only as directed by your health care provider.  Rest the affected joints until your symptoms start to go away.  Keep your affected joints raised (elevated) when possible. This will help to reduce swelling.  If directed, apply ice to the affected area:  Put ice in a plastic bag.  Place a towel between your skin and the  bag.  Leave the ice on for 20 minutes, 2-3 times per day.  If the painful joint is in your leg, use crutches as directed by your health care provider.  When your symptoms start to go away, begin to exercise regularly or do physical therapy. Talk with your health care provider or physical therapist about what types of exercise are safe for you. Low-impact exercise may be best. This includes walking, swimming, bicycling, and water aerobics.  Maintain a healthy weight so your joints do not need to bear more weight than necessary. SEEK MEDICAL CARE IF:  You have an increase in joint pain that is not relieved with medicine.  Your joint becomes more red, swollen, or stiff.  You have a fever.  You have a skin rash.   This information is not intended to replace advice given to you by your health care provider. Make sure you discuss any questions you have with your health care provider.   Document Released: 01/21/2004 Document Revised: 09/14/2014 Document Reviewed: 04/07/2014 Elsevier Interactive Patient Education [employer].

## 2016-01-25 DIAGNOSIS — H401131 Primary open-angle glaucoma, bilateral, mild stage: Secondary | ICD-10-CM | POA: Diagnosis not present

## 2016-02-07 ENCOUNTER — Telehealth: Payer: Self-pay

## 2016-02-07 DIAGNOSIS — E039 Hypothyroidism, unspecified: Secondary | ICD-10-CM

## 2016-02-07 MED ORDER — LEVOTHYROXINE SODIUM 50 MCG PO TABS: 50.0000 ug | ORAL_TABLET | Freq: Every day | ORAL | 0 refills | Status: DC

## 2016-02-07 NOTE — Telephone Encounter (Signed)
Patient request refill for  Levothyroxine at patient request #[phone] refills has been sent to pharmacy and she will discuss concerns on the medication at her follow up appt next month. Rhonda Cunningham,CMA

## 2016-02-14 ENCOUNTER — Ambulatory Visit: Payer: Commercial Managed Care - HMO | Admitting: Family Medicine

## 2016-02-23 ENCOUNTER — Encounter: Payer: Self-pay | Admitting: Osteopathic Medicine

## 2016-02-23 ENCOUNTER — Ambulatory Visit (INDEPENDENT_AMBULATORY_CARE_PROVIDER_SITE_OTHER): Payer: Commercial Managed Care - HMO | Admitting: Osteopathic Medicine

## 2016-02-23 ENCOUNTER — Other Ambulatory Visit: Payer: Self-pay | Admitting: Osteopathic Medicine

## 2016-02-23 VITALS — BP 164/68 | HR 82 | Ht 64.0 in | Wt 148.0 lb

## 2016-02-23 DIAGNOSIS — Z1321 Encounter for screening for nutritional disorder: Secondary | ICD-10-CM | POA: Diagnosis not present

## 2016-02-23 DIAGNOSIS — E039 Hypothyroidism, unspecified: Secondary | ICD-10-CM

## 2016-02-23 DIAGNOSIS — N183 Chronic kidney disease, stage 3 unspecified: Secondary | ICD-10-CM

## 2016-02-23 DIAGNOSIS — R252 Cramp and spasm: Secondary | ICD-10-CM | POA: Diagnosis not present

## 2016-02-23 DIAGNOSIS — M1990 Unspecified osteoarthritis, unspecified site: Secondary | ICD-10-CM

## 2016-02-23 DIAGNOSIS — N184 Chronic kidney disease, stage 4 (severe): Secondary | ICD-10-CM

## 2016-02-23 DIAGNOSIS — R55 Syncope and collapse: Secondary | ICD-10-CM

## 2016-02-23 DIAGNOSIS — F329 Major depressive disorder, single episode, unspecified: Secondary | ICD-10-CM

## 2016-02-23 DIAGNOSIS — I10 Essential (primary) hypertension: Secondary | ICD-10-CM

## 2016-02-23 DIAGNOSIS — Z23 Encounter for immunization: Secondary | ICD-10-CM | POA: Diagnosis not present

## 2016-02-23 DIAGNOSIS — R42 Dizziness and giddiness: Secondary | ICD-10-CM

## 2016-02-23 DIAGNOSIS — F32A Depression, unspecified: Secondary | ICD-10-CM

## 2016-02-23 HISTORY — DX: Chronic kidney disease, stage 4 (severe): N18.4

## 2016-02-23 LAB — CBC WITH DIFFERENTIAL/PLATELET
BASOS ABS: 72 {cells}/uL (ref 0–200)
Basophils Relative: 1 %
EOS ABS: 216 {cells}/uL (ref 15–500)
EOS PCT: 3 %
HCT: 37.6 % (ref 35.0–45.0)
Hemoglobin: 12.3 g/dL (ref 11.7–15.5)
LYMPHS PCT: 24 %
Lymphs Abs: 1728 cells/uL (ref 850–3900)
MCH: 31.5 pg (ref 27.0–33.0)
MCHC: 32.7 g/dL (ref 32.0–36.0)
MCV: 96.4 fL (ref 80.0–100.0)
MONOS PCT: 9 %
MPV: 9.2 fL (ref 7.5–12.5)
Monocytes Absolute: 648 cells/uL (ref 200–950)
NEUTROS PCT: 63 %
Neutro Abs: 4536 cells/uL (ref 1500–7800)
PLATELETS: 259 10*3/uL (ref 140–400)
RBC: 3.9 MIL/uL (ref 3.80–5.10)
RDW: 14.5 % (ref 11.0–15.0)
WBC: 7.2 10*3/uL (ref 3.8–10.8)

## 2016-02-23 MED ORDER — ESCITALOPRAM OXALATE 5 MG PO TABS: 5.0000 mg | ORAL_TABLET | Freq: Every day | ORAL | 1 refills | Status: DC

## 2016-02-23 NOTE — Patient Instructions (Addendum)
I understand that the dizziness and the leg cramps are concerning for you, as well as your depression. Today, we are getting some blood work to see what's going on and further testing as well for heart health and dizziness. We are starting a medication for depression. I do not want to add too many medications in case you have any side effects we will not be able to tell which one may be the problem, particularly since dizziness is a concern and you've had problems with blackout - I do not want to increase your risk of falling or injury. Let's wait until we have a little bit more information, so we can make sure that you're safe.    Leg cramps: Getting blood work today for further evaluation. If symptoms persist, may need to consider referral for physical therapy.  Dizziness: My concern is that this is more of a cardiac issue than a neurologic issue. I do not think Valium is a good option to help treat this, I think we need to evaluate for possible cardiac causes. I have placed orders for echocardiogram, which is just under the heart, and a repeat ultrasound of the carotid arteries. He should hear about setting up an appointment to have these tests done, please let us know if you do not get a call.   Depression: It is normal to feel sad as a part of grief. We are starting a low-dose medication that will hopefully help to stabilize her mood and help you to cope with your loss. I also encourage you to seek counseling, hospice can typically arrange this for you or I am happy to place a referral to behavioral health in our building. If you experience any side effects or problems with the medications, please let me know right away. Otherwise, let's plan to follow-up in the next month or so to talk more about how you're doing with this medicine.

## 2016-02-23 NOTE — Progress Notes (Signed)
HPI: Laura Ellis is a 82 y.o. female  who presents to [facility] today, 02/23/16,  for chief complaint of:  Chief Complaint  Patient presents with  . Follow-up    Patient has multiple concerns to address today  Neurologic/cardiac: Patient reports long-standing dizziness on occasion, but more recently has had an episode where she was walking up a flight of stairs and nearly passed out, daughter was there, no fall/head injury occurred. Patient has previously been on low dose Valium in the past for vertigo, but she does not complain at this point of any dizziness associated with head position changes, only with leaning for to standing up, and the episode as noted above after climbing a flight of stairs. Denies chest pain, shortness of breath, palpitations.  Psychiatric: Patient's husband died a few months ago as a result of advanced lung cancer. She reports good family support, daughter has moved in with her and is working from home. Patient plans on moving down to [place] in the next year or so = to family. She is having some difficulty with grief however, continues to be very emotional, crying a lot. Asks if there is any medication which may help.  Musculoskeletal: Patient reports fill out form for handicap parking and right bladder for permission to moves mailbox further away from the road since she has difficulty walking about far down to the end of the road and it is also on a busy street and she is worried about safety issues.  Musculoskeletal: Patient is having some lower extremity cramping-type symptoms, close to the ankles/feet, only really happens at night, she went cold water over a washcloth and massages the area and this seems to help the pain. She is not taking any over-the-counter medications for this other than turmeric.    Past medical, surgical, social and family history reviewed: Past Medical History:  Diagnosis Date  . Arthritis   .  Hypertension   . Osteopenia    Past Surgical History:  Procedure Laterality Date  . ABDOMINAL HYSTERECTOMY    . KNEE ARTHROSCOPY    . VARICOSE VEIN SURGERY     Social History  Substance Use Topics  . Smoking status: Former Research scientist (life sciences)  . Smokeless tobacco: Former Systems developer    Quit date: 12/03/2001  . Alcohol use Yes     Comment: occ   No family history on file.   Current medication list and allergy/intolerance information reviewed:   Current Outpatient Prescriptions  Medication Sig Dispense Refill  . Ascorbic Acid (VITAMIN C) 1000 MG tablet Take 1,000 mg by mouth daily.    [name] (BOSWELLIA PO) Take 250 mg by mouth. Patient takes 2 daily    . chlorthalidone (HYGROTON) 25 MG tablet Take 1 tablet (25 mg total) by mouth daily. 90 tablet 1  . Cholecalciferol (VITAMIN D PO) Take 1 tablet by mouth daily.    . Coenzyme Q10 (COQ10) 100 MG CAPS Take by mouth daily.    . Cyanocobalamin (VITAMIN B-12 PO) Take 1 tablet by mouth daily.    . Lactobacillus (PROBIOTIC ACIDOPHILUS) TABS Take by mouth daily.    [name] levothyroxine (SYNTHROID, LEVOTHROID) 50 MCG tablet Take 1 tablet (50 mcg total) by mouth daily before breakfast. 30 tablet 0  . losartan (COZAAR) 50 MG tablet Take 1 tablet (50 mg total) by mouth 2 (two) times daily. 180 tablet 2  . Magnesium Sulfate 70 MG CAPS Take by mouth.    . Methylsulfonylmethane (MSM PO) Take  1 tablet by mouth 2 (two) times daily.    . Multiple Vitamin (MULTIVITAMIN WITH MINERALS) TABS tablet Take 1 tablet by mouth daily.    [name] NAPROXEN PO Take 1 tablet by mouth 2 (two) times daily.    . Omega-3 Fatty Acids (FISH OIL PO) Take 1 capsule by mouth daily.    [name] OVER THE COUNTER MEDICATION Take 1 tablet by mouth daily.    . pneumococcal 13-valent conjugate vaccine (PREVNAR 13) SUSP injection     . Potassium 99 MG TABS Take by mouth.    . Red Yeast Rice 600 MG CAPS Take 600 mg by mouth.    . Turmeric Curcumin 500 MG CAPS Take by mouth.     No current  facility-administered medications for this visit.    Allergies  Allergen Reactions  . Ace Inhibitors     Cough  . Alendronate Sodium     esophagitis  . Ezetimibe   . Fenofibrate     arthralgia  . Rosuvastatin Calcium   . Statins     Muscle aches      Review of Systems:  Constitutional:  No  fever, no chills, No recent illness, No unintentional weight changes. No significant fatigue.   HEENT: No  headache, no vision change  Cardiac: No  chest pain, No  pressure, No palpitations, No  Orthopnea, + syncope/presyncope  Respiratory:  No  shortness of breath. No  Cough  Gastrointestinal: No  abdominal pain, No  nausea  Musculoskeletal: +new myalgia/arthralgia  Skin: No  Rash, No other wounds/concerning lesions  Endocrine: No cold intolerance,  No heat intolerance. No polyuria/polydipsia/polyphagia   Neurologic: No  weakness, +dizziness, No  slurred speech/focal weakness/facial droop  Psychiatric: +concerns with depression, No  concerns with anxiety, +sleep problems, No mood problems  Exam:  BP (!) 164/68   Pulse 82   Ht _0  (1.626 m)   Wt 148 lb (67.1 kg)   BMI 25.40 kg/m   Constitutional: VS see above. General Appearance: alert, well-developed, well-nourished, NAD  Eyes: Normal lids and conjunctive, non-icteric sclera, EOMI no nystagmus  Ears, Nose, Mouth, Throat: MMM, Normal external inspection ears/nares/mouth/lips/gums  Neck: No masses, trachea midline. No thyroid enlargement. No tenderness/mass appreciated. No lymphadenopathy  Respiratory: Normal respiratory effort. no wheeze, no rhonchi, no rales  Cardiovascular: S1/S2 normal, no murmur, no rub/gallop auscultated. RRR. No lower extremity edema. Pedal pulse II/IV bilaterally DP and PT. No JVD.   Gastrointestinal: Nontender, no masses.   Musculoskeletal: Gait normal. No clubbing/cyanosis of digits. Homan's negative.   Neurological: No cranial nerve deficit on limited exam. Motor and sensation intact and  symmetric. Cerebellar reflexes intact. Normal balance/coordination. No tremor.   Skin: warm, dry, intact.   Psychiatric: Normal judgment/insight. Depressed mood and affect. Oriented x3.    Results for orders placed or performed in visit on 02/23/16 (from the past 72 hour(s))  CBC with Differential/Platelet     Status: None   Collection Time: 02/23/16  3:16 PM  Result Value Ref Range   WBC 7.2 3.8 - 10.8 K/uL   RBC 3.90 3.80 - 5.10 MIL/uL   Hemoglobin 12.3 11.7 - 15.5 g/dL   HCT 37.6 35.0 - 45.0 %   MCV 96.4 80.0 - 100.0 fL   MCH 31.5 27.0 - 33.0 pg   MCHC 32.7 32.0 - 36.0 g/dL   RDW 14.5 11.0 - 15.0 %   Platelets 259 140 - 400 K/uL   MPV 9.2 7.5 - 12.5 fL   Neutro Abs  4,536 1,500 - 7,800 cells/uL   Lymphs Abs 1,728 850 - 3,900 cells/uL   Monocytes Absolute 648 200 - 950 cells/uL   Eosinophils Absolute 216 15 - 500 cells/uL   Basophils Absolute 72 0 - 200 cells/uL   Neutrophils Relative % 63 %   Lymphocytes Relative 24 %   Monocytes Relative 9 %   Eosinophils Relative 3 %   Basophils Relative 1 %   Smear Review Criteria for review not met   COMPLETE METABOLIC PANEL WITH GFR     Status: Abnormal   Collection Time: 02/23/16  3:16 PM  Result Value Ref Range   Sodium 140 135 - 146 mmol/L   Potassium 4.3 3.5 - 5.3 mmol/L   Chloride 103 98 - 110 mmol/L   CO2 25 20 - 31 mmol/L   Glucose, Bld 90 65 - 99 mg/dL   BUN 43 (H) 7 - 25 mg/dL   Creat 1.77 (H) 0.60 - 0.88 mg/dL    Comment:   For patients > or = 80 years of age: The upper reference limit for Creatinine is approximately 13% higher for people identified as African-American.      Total Bilirubin 0.4 0.2 - 1.2 mg/dL   Alkaline Phosphatase 50 33 - 130 U/L   AST 23 10 - 35 U/L   ALT 15 6 - 29 U/L   Total Protein 7.0 6.1 - 8.1 g/dL   Albumin 4.2 3.6 - 5.1 g/dL   Calcium 9.7 8.6 - 10.4 mg/dL   GFR, Est African American 31 (L) >=60 mL/min   GFR, Est Non African American 27 (L) >=60 mL/min  TSH     Status: None    Collection Time: 02/23/16  3:16 PM  Result Value Ref Range   TSH 2.66 mIU/L    Comment:   Reference Range   > or = 20 Years  0.40-4.50   Pregnancy Range First trimester  0.26-2.66 Second trimester 0.55-2.73 Third trimester  0.43-2.91     VITAMIN D 25 Hydroxy (Vit-D Deficiency, Fractures)     Status: None   Collection Time: 02/23/16  3:16 PM  Result Value Ref Range   Vit D, 25-Hydroxy 38 30 - 100 ng/mL    Comment: Vitamin D Status           25-OH Vitamin D        Deficiency                <20 ng/mL        Insufficiency         20 - 29 ng/mL        Optimal             > or = 30 ng/mL   For 25-OH Vitamin D testing on patients on D2-supplementation and patients for whom quantitation of D2 and D3 fractions is required, the QuestAssureD 25-OH VIT D, (D2,D3), LC/MS/MS is recommended: order code [id number] (patients > 2 yrs).   Lipid panel     Status: Abnormal   Collection Time: 02/23/16  3:16 PM  Result Value Ref Range   Cholesterol 204 (H) 125 - 200 mg/dL   Triglycerides 155 (H) <150 mg/dL   HDL 68 >=46 mg/dL   Total CHOL/HDL Ratio 3.0 <=5.0 Ratio   VLDL 31 (H) <30 mg/dL   LDL Cholesterol 105 <130 mg/dL    Comment:   Total Cholesterol/HDL Ratio:CHD Risk  Coronary Heart Disease Risk Table                                        Men       Women          1/2 Average Risk              3.4        3.3              Average Risk              5.0        4.4           2X Average Risk              9.6        7.1           3X Average Risk             23.4       11.0 Use the calculated Patient Ratio above and the CHD Risk table  to determine the patient's CHD Risk.   PTH, Intact and Calcium     Status: None (Preliminary result)   Collection Time: 02/23/16  3:16 PM  Result Value Ref Range   PTH  14 - 64 pg/mL   Calcium  8.6 - 10.4 mg/dL    Comment:   Interpretive Guide:                              Intact PTH               Calcium                               ----------               ------- Normal Parathyroid           Normal                   Normal Hypoparathyroidism           Low or Low Normal        Low Hyperparathyroidism      Primary                 Normal or High           High      Secondary               High                     Normal or Low      Tertiary                High                     High Non-Parathyroid   Hypercalcemia              Low or Low Normal        High     No results found.  Orthostatic VS for the past 24 hrs:  BP- Lying Pulse- Lying BP- Sitting Pulse- Sitting  02/23/16 1456 150/84 80 143/83 87    EKG interpretation: Rate: 82 Rhythm: sinus  No ST/T changes concerning for acute ischemia/infarct     ASSESSMENT/PLAN:   Essential hypertension - Plan: CBC with Differential/Platelet, COMPLETE METABOLIC PANEL WITH GFR, Lipid panel  Need for prophylactic vaccination and inoculation against influenza - Plan: Flu Vaccine QUAD 36+ mos IM  Hypothyroidism, unspecified type - Plan: TSH, Lipid panel  Leg cramps - Check electrolytes, consider gabapentin, not consistent with restless leg-type picture  Arthritis  Stage 3 chronic kidney disease - Plan: COMPLETE METABOLIC PANEL WITH GFR, VITAMIN D 25 Hydroxy (Vit-D Deficiency, Fractures), PTH, Intact and Calcium  Dizziness - Plan: EKG 12-Lead, ECHOCARDIOGRAM COMPLETE, US Carotid Duplex Bilateral  Depression, unspecified depression type - Try low-dose SSRI the patient is counseled that normal grief reaction is to be expected and is okay. I'm happy to refer to counseling as well if she would like  Syncope, unspecified syncope type - Suspect vasovagal/cardiac cause rather than vestibular. Echocardiogram and carotid Doppler pending, ER precautions reviewed, daughter lives with patient - Plan: ECHOCARDIOGRAM COMPLETE, US Carotid Duplex Bilateral     Visit summary with medication list and pertinent instructions was printed for patient to review. All questions at time  of visit were answered - patient instructed to contact office with any additional concerns. ER/RTC precautions were reviewed with the patient. Follow-up plan: Return in about 4 weeks (around 03/22/2016) for follow-up on antidepressant medication, return sooner if needed.   Patient Instructions  I understand that the dizziness and the leg cramps are concerning for you, as well as your depression. Today, we are getting some blood work to see what's going on and further testing as well for heart health and dizziness. We are starting a medication for depression. I do not want to add too many medications in case you have any side effects we will not be able to tell which one may be the problem, particularly since dizziness is a concern and you've had problems with blackout - I do not want to increase your risk of falling or injury. Let's wait until we have a little bit more information, so we can make sure that you're safe.    Leg cramps: Getting blood work today for further evaluation. If symptoms persist, may need to consider referral for physical therapy.  Dizziness: My concern is that this is more of a cardiac issue than a neurologic issue. I do not think Valium is a good option to help treat this, I think we need to evaluate for possible cardiac causes. I have placed orders for echocardiogram, which is just under the heart, and a repeat ultrasound of the carotid arteries. He should hear about setting up an appointment to have these tests done, please let us know if you do not get a call.   Depression: It is normal to feel sad as a part of grief. We are starting a low-dose medication that will hopefully help to stabilize her mood and help you to cope with your loss. I also encourage you to seek counseling, hospice can typically arrange this for you or I am happy to place a referral to behavioral health in our building. If you experience any side effects or problems with the medications, please let me know  right away. Otherwise, let's plan to follow-up in the next month or so to talk more about how you're doing with this medicine.         Note: Total time spent 40 minutes, greater than 50% of the visit was spent face-to-face counseling and coordinating care for the following:  The primary encounter diagnosis was Essential hypertension. Diagnoses of Need for prophylactic vaccination and inoculation against influenza, Hypothyroidism, unspecified type, Leg cramps, Arthritis, Stage 3 chronic kidney disease, Dizziness, Depression, unspecified depression type, and Syncope, unspecified syncope type were also pertinent to this visit.[name]

## 2016-02-24 LAB — COMPLETE METABOLIC PANEL WITH GFR
ALK PHOS: 50 U/L (ref 33–130)
ALT: 15 U/L (ref 6–29)
AST: 23 U/L (ref 10–35)
Albumin: 4.2 g/dL (ref 3.6–5.1)
BUN: 43 mg/dL — ABNORMAL HIGH (ref 7–25)
CALCIUM: 9.7 mg/dL (ref 8.6–10.4)
CHLORIDE: 103 mmol/L (ref 98–110)
CO2: 25 mmol/L (ref 20–31)
Creat: 1.77 mg/dL — ABNORMAL HIGH (ref 0.60–0.88)
GFR, EST AFRICAN AMERICAN: 31 mL/min — AB (ref >=60)
GFR, EST NON AFRICAN AMERICAN: 27 mL/min — AB (ref >=60)
Glucose, Bld: 90 mg/dL (ref 65–99)
POTASSIUM: 4.3 mmol/L (ref 3.5–5.3)
Sodium: 140 mmol/L (ref 135–146)
Total Bilirubin: 0.4 mg/dL (ref 0.2–1.2)
Total Protein: 7 g/dL (ref 6.1–8.1)

## 2016-02-24 LAB — LIPID PANEL
Cholesterol: 204 mg/dL — ABNORMAL HIGH (ref 125–200)
HDL: 68 mg/dL (ref >=46)
LDL Cholesterol: 105 mg/dL (ref <130)
TRIGLYCERIDES: 155 mg/dL — AB (ref <150)
Total CHOL/HDL Ratio: 3 Ratio (ref <=5.0)
VLDL: 31 mg/dL — ABNORMAL HIGH (ref <30)

## 2016-02-24 LAB — TSH: TSH: 2.66 m[IU]/L

## 2016-02-24 LAB — PTH, INTACT AND CALCIUM
Calcium: 9.7 mg/dL (ref 8.6–10.4)
PTH: 28 pg/mL (ref 14–64)

## 2016-02-24 LAB — VITAMIN D 25 HYDROXY (VIT D DEFICIENCY, FRACTURES): VIT D 25 HYDROXY: 38 ng/mL (ref 30–100)

## 2016-02-24 LAB — VITAMIN B12: Vitamin B-12: 773 pg/mL (ref 200–1100)

## 2016-03-01 ENCOUNTER — Telehealth: Payer: Self-pay

## 2016-03-01 MED ORDER — GABAPENTIN 100 MG PO CAPS: 100.0000 mg | ORAL_CAPSULE | Freq: Every day | ORAL | 0 refills | Status: DC

## 2016-03-01 NOTE — Telephone Encounter (Signed)
Please call patient: I sent in a medication to her pharmacy called gabapentin. This may help with some of the cramping pain. Can take 1-3 tablets in the evenings, caution may cause sedation. If these are helping her, we can continue, if not, please call me and let me know and we can consider going up on the dose or changing the medicine to something else.

## 2016-03-01 NOTE — Telephone Encounter (Signed)
SPOKE TO PATIENT GAVE HER INFRMATION AS NOTED BELOW. Starleen Trussell,CMA

## 2016-03-01 NOTE — Telephone Encounter (Signed)
Patient called stated that she is taking OTC anti inflammatories and she is using the cold compress, patient stated that the OTC stuff isn't  helping her, she thinks her body has built up an immunity to those. Please advise. Khushbu Pippen,CMA

## 2016-03-07 ENCOUNTER — Ambulatory Visit (HOSPITAL_BASED_OUTPATIENT_CLINIC_OR_DEPARTMENT_OTHER)
Admission: RE | Admit: 2016-03-07 | Discharge: 2016-03-07 | Disposition: A | Discharge status: Home / Self Care | Payer: Commercial Managed Care - HMO | Source: Ambulatory Visit | Attending: Osteopathic Medicine | Admitting: Osteopathic Medicine

## 2016-03-07 DIAGNOSIS — R55 Syncope and collapse: Secondary | ICD-10-CM | POA: Diagnosis not present

## 2016-03-07 DIAGNOSIS — I071 Rheumatic tricuspid insufficiency: Secondary | ICD-10-CM | POA: Diagnosis not present

## 2016-03-07 DIAGNOSIS — I10 Essential (primary) hypertension: Secondary | ICD-10-CM | POA: Diagnosis not present

## 2016-03-07 DIAGNOSIS — I340 Nonrheumatic mitral (valve) insufficiency: Secondary | ICD-10-CM | POA: Insufficient documentation

## 2016-03-07 DIAGNOSIS — I6523 Occlusion and stenosis of bilateral carotid arteries: Secondary | ICD-10-CM | POA: Insufficient documentation

## 2016-03-07 DIAGNOSIS — I371 Nonrheumatic pulmonary valve insufficiency: Secondary | ICD-10-CM | POA: Insufficient documentation

## 2016-03-07 DIAGNOSIS — R42 Dizziness and giddiness: Secondary | ICD-10-CM | POA: Diagnosis not present

## 2016-03-07 NOTE — Progress Notes (Signed)
  Echocardiogram 2D Echocardiogram has been performed.  Jennette Dubin 03/07/2016, 4:05 PM

## 2016-03-08 ENCOUNTER — Encounter: Payer: Self-pay | Admitting: Osteopathic Medicine

## 2016-03-12 ENCOUNTER — Telehealth: Payer: Self-pay

## 2016-03-12 DIAGNOSIS — R233 Spontaneous ecchymoses: Secondary | ICD-10-CM

## 2016-03-12 NOTE — Telephone Encounter (Signed)
Okay, I sent referral

## 2016-03-12 NOTE — Telephone Encounter (Signed)
Patient request referral for Dermatologist . She prefers Dr. Frederico Hamman in [place]. She wants to see him for pimples /red spots that appear on under her right eye.Laura Ellis She wants to make sure its not cancerous. Fax # [phone]  Oneta Rack

## 2016-03-19 DIAGNOSIS — L820 Inflamed seborrheic keratosis: Secondary | ICD-10-CM | POA: Diagnosis not present

## 2016-03-19 DIAGNOSIS — L814 Other melanin hyperpigmentation: Secondary | ICD-10-CM | POA: Diagnosis not present

## 2016-03-22 ENCOUNTER — Ambulatory Visit (INDEPENDENT_AMBULATORY_CARE_PROVIDER_SITE_OTHER): Payer: Commercial Managed Care - HMO | Admitting: Osteopathic Medicine

## 2016-03-22 VITALS — BP 142/78 | HR 73 | Ht 64.0 in | Wt 143.0 lb

## 2016-03-22 DIAGNOSIS — R42 Dizziness and giddiness: Secondary | ICD-10-CM

## 2016-03-22 DIAGNOSIS — I10 Essential (primary) hypertension: Secondary | ICD-10-CM | POA: Diagnosis not present

## 2016-03-22 DIAGNOSIS — F329 Major depressive disorder, single episode, unspecified: Secondary | ICD-10-CM

## 2016-03-22 DIAGNOSIS — F32A Depression, unspecified: Secondary | ICD-10-CM

## 2016-03-22 DIAGNOSIS — R252 Cramp and spasm: Secondary | ICD-10-CM

## 2016-03-22 MED ORDER — CHLORTHALIDONE 25 MG PO TABS: 25.0000 mg | ORAL_TABLET | Freq: Every day | ORAL | 1 refills | Status: DC

## 2016-03-22 MED ORDER — ESCITALOPRAM OXALATE 5 MG PO TABS: 5.0000 mg | ORAL_TABLET | Freq: Every day | ORAL | 1 refills | Status: DC

## 2016-03-22 NOTE — Progress Notes (Signed)
HPI: Laura Ellis is a 82 y.o. female  who presents to [place] today, 03/22/16,  for chief complaint of:  Chief Complaint  Patient presents with  . Follow-up    ANTIDEPRESSANT MEDICATION    Depression/grief: Patient believes that the Lexapro is helping with mood/grief. Patient's daughter is up from [place] and living with her, Judson Roch. Patient thinks this is good for the both of them of her daughter's son also passed away this summer. No concerning side effects from the medications.  Hypertension: Improved on recheck. No chest pain, pressure, headache. Medications refilled.  Leg cramps: Is using topical therapy as well as gabapentin, these have eased up though not completely resolved.  Dizziness: Orthostatic in nature, only happens when patient gets up from lying or seated position, typically worse in the morning. No falls. No chest pain or shortness of breath.    Past medical, surgical, social and family history reviewed: Patient Active Problem List   Diagnosis Date Noted  . Chronic kidney disease 02/23/2016  . Trochanteric bursitis of left hip 01/17/2016  . Left ankle pain 01/17/2016  . Hypothyroidism 07/05/2015  . Osteoporosis 05/26/2015  . Cataract 05/26/2015  . Bilateral knee pain 05/26/2015  . Bilateral low back pain without sciatica 05/26/2015  . Essential hypertension 05/26/2015   Past Surgical History:  Procedure Laterality Date  . ABDOMINAL HYSTERECTOMY    . KNEE ARTHROSCOPY    . VARICOSE VEIN SURGERY     Social History  Substance Use Topics  . Smoking status: Former Research scientist (life sciences)  . Smokeless tobacco: Former Systems developer    Quit date: 12/03/2001  . Alcohol use Yes     Comment: occ   No family history on file.   Current medication list and allergy/intolerance information reviewed:   Current Outpatient Prescriptions on File Prior to Visit  Medication Sig Dispense Refill  . Ascorbic Acid (VITAMIN C) 1000 MG tablet Take 1,000 mg by  mouth daily.    [name] (BOSWELLIA PO) Take 250 mg by mouth. Patient takes 2 daily    . chlorthalidone (HYGROTON) 25 MG tablet Take 1 tablet (25 mg total) by mouth daily. 90 tablet 1  . Cholecalciferol (VITAMIN D PO) Take 1 tablet by mouth daily.    . Coenzyme Q10 (COQ10) 100 MG CAPS Take by mouth daily.    . Cyanocobalamin (VITAMIN B-12 PO) Take 1 tablet by mouth daily.    [name] escitalopram (LEXAPRO) 5 MG tablet Take 1 tablet (5 mg total) by mouth at bedtime. 30 tablet 1  . gabapentin (NEURONTIN) 100 MG capsule Take 1-3 capsules (100-300 mg total) by mouth at bedtime. For leg cramps 90 capsule 0  . Lactobacillus (PROBIOTIC ACIDOPHILUS) TABS Take by mouth daily.    [name] levothyroxine (SYNTHROID, LEVOTHROID) 50 MCG tablet Take 1 tablet (50 mcg total) by mouth daily before breakfast. 30 tablet 0  . losartan (COZAAR) 50 MG tablet Take 1 tablet (50 mg total) by mouth 2 (two) times daily. 180 tablet 2  . Magnesium Sulfate 70 MG CAPS Take by mouth.    . Methylsulfonylmethane (MSM PO) Take 1 tablet by mouth 2 (two) times daily.    . Multiple Vitamin (MULTIVITAMIN WITH MINERALS) TABS tablet Take 1 tablet by mouth daily.    [name] NAPROXEN PO Take 1 tablet by mouth 2 (two) times daily.    . Omega-3 Fatty Acids (FISH OIL PO) Take 1 capsule by mouth daily.    [name] OVER THE COUNTER MEDICATION Take 1 tablet  by mouth daily.    . pneumococcal 13-valent conjugate vaccine (PREVNAR 13) SUSP injection     . Potassium 99 MG TABS Take by mouth.    . Red Yeast Rice 600 MG CAPS Take 600 mg by mouth.    . Turmeric Curcumin 500 MG CAPS Take by mouth.     No current facility-administered medications on file prior to visit.    Allergies  Allergen Reactions  . Ace Inhibitors     Cough  . Alendronate Sodium     esophagitis  . Ezetimibe   . Fenofibrate     arthralgia  . Rosuvastatin Calcium   . Statins     Muscle aches      Review of Systems:  Constitutional: No recent illness  HEENT: No   headache  Cardiac: No  chest pain, No  pressure, No palpitations  Respiratory:  No  shortness of breath.   Musculoskeletal: No new myalgia/arthralgia, leg cramps better   Hem/Onc: No  easy bruising/bleeding, No  abnormal lumps/bumps  Neurologic: No  weakness, +Dizziness  Psychiatric: +concerns with depression, No  concerns with anxiety  Exam:  BP (!) 142/78   Pulse 73   Ht 5\' 4"  (1.626 m)   Wt 143 lb (64.9 kg)   BMI 24.55 kg/m   Constitutional: VS see above. General Appearance: alert, well-developed, well-nourished, NAD  Eyes: Normal lids and conjunctive, non-icteric sclera  Ears, Nose, Mouth, Throat: MMM, Normal external inspection ears/nares/mouth/lips/gums.  Neck: No masses, trachea midline.   Respiratory: Normal respiratory effort. no wheeze, no rhonchi, no rales  Cardiovascular: S1/S2 normal, no murmur, no rub/gallop auscultated. RRR.   Musculoskeletal: Gait normal. Symmetric and independent movement of all extremities  Neurological: Normal balance/coordination. No tremor.  Skin: warm, dry, intact.   Psychiatric: Normal judgment/insight. Normal mood and affect. Oriented x3.      ASSESSMENT/PLAN:   Depression, unspecified depression type - Continue low-dose Lexapro for about 6 months at least prior to discussion of dis/continuation  Essential hypertension - Plan: chlorthalidone (HYGROTON) 25 MG tablet  Leg cramps - Can go up on dose of gabapentin, let me know  Dizziness - Orthostatic, precautions/behavioral modifications reviewed with patient for prevention.      Visit summary with medication list and pertinent instructions was printed for patient to review. All questions at time of visit were answered - patient instructed to contact office with any additional concerns. ER/RTC precautions were reviewed with the patient. Follow-up plan: Return in about 6 months (around 09/19/2016) for RECHECK BLOOD PRESSURE AND MEDICATIONS (OV30) .

## 2016-03-28 ENCOUNTER — Telehealth: Payer: Self-pay

## 2016-03-28 NOTE — Telephone Encounter (Signed)
Patient called stated that she do not want to get her toenail removed as of now she is a requesting a Rx for Urea Paste 40%. She would like to try this first. Please advise. Ellouise Mcwhirter,CMA

## 2016-03-30 NOTE — Telephone Encounter (Signed)
Dr. Loni Muse please see patient request below. Nickolus Wadding,CMA

## 2016-03-30 NOTE — Telephone Encounter (Signed)
Urea paste is not really a treatment for an ingrown toenail. I called patient and left a voicemail to get some clarification on this, whether it something she has used in the past or if it was more of a thickened toenail issue or callus issue. If she calls back, please ask if she has been on the urea paste before for this problem, and if she has an okay to write for it with the understanding that if it doesn't improve her condition she will need to follow-up with podiatry or come back into the office for me to take a look at it

## 2016-04-02 MED ORDER — UREA-HYALURONATE SODIUM 40 & 0.2 % EX KIT: 1.0000 "application " | PACK | CUTANEOUS | 1 refills | Status: DC

## 2016-04-02 NOTE — Telephone Encounter (Signed)
Sent. If no better or if any reaction to the treatment, let me know and will place referral to podiatry

## 2016-04-02 NOTE — Telephone Encounter (Signed)
See below

## 2016-04-02 NOTE — Telephone Encounter (Signed)
Spoke to patient she stated that she has not tried this medication before but she has done some research on it and she would like to try it until she build the nerve to have it removed. Patient stated that she has tried other OTC medication and it has not worked. Patient understands that if it does not work then she will see a podiatrist or come in the office for a visit. Please advise Laura Ellis

## 2016-04-03 NOTE — Telephone Encounter (Signed)
Patient informed. Rhonda Cunningham,CMA  

## 2016-04-10 ENCOUNTER — Telehealth: Payer: Self-pay

## 2016-04-10 MED ORDER — UREA-HYALURONATE SODIUM 40 & 0.2 % EX KIT: 1.0000 "application " | PACK | CUTANEOUS | 1 refills | Status: DC

## 2016-04-10 NOTE — Telephone Encounter (Signed)
Urea nail medication was sent to wrong pharmacy so I sent it to [facility]

## 2016-04-16 ENCOUNTER — Telehealth: Payer: Self-pay

## 2016-04-16 NOTE — Telephone Encounter (Signed)
Left message for patient to call back regarding referral to podiatry.

## 2016-04-16 NOTE — Telephone Encounter (Signed)
Honestly, would recommend follow-up with podiatry. Please ask patient is she is open to revisiting this possibility.

## 2016-04-20 ENCOUNTER — Telehealth: Payer: Self-pay

## 2016-04-20 NOTE — Telephone Encounter (Signed)
Patient called stated that she needs the Rx for Urea gel 40% and not the cream which was written. Please correct. Please advise that it needs to go to [facility]. Rhonda Cunningham,CMA

## 2016-04-23 MED ORDER — UREA 40 % EX GEL: CUTANEOUS | 1 refills | Status: DC

## 2016-04-23 NOTE — Telephone Encounter (Signed)
Sent!

## 2016-04-26 ENCOUNTER — Other Ambulatory Visit: Payer: Self-pay

## 2016-04-26 MED ORDER — GABAPENTIN 100 MG PO CAPS: 100.0000 mg | ORAL_CAPSULE | Freq: Two times a day (BID) | ORAL | 3 refills | Status: DC

## 2016-04-26 NOTE — Telephone Encounter (Signed)
Patient request refill for gabapentin 300 mg.#90 3 refill sent to [facility]. Durk Carmen,CMA

## 2016-06-25 ENCOUNTER — Other Ambulatory Visit: Payer: Self-pay | Admitting: Osteopathic Medicine

## 2016-08-03 ENCOUNTER — Encounter: Payer: Self-pay | Admitting: Vascular Surgery

## 2016-08-07 ENCOUNTER — Other Ambulatory Visit: Payer: Self-pay

## 2016-08-07 DIAGNOSIS — I8393 Asymptomatic varicose veins of bilateral lower extremities: Secondary | ICD-10-CM

## 2016-08-15 ENCOUNTER — Ambulatory Visit (INDEPENDENT_AMBULATORY_CARE_PROVIDER_SITE_OTHER): Payer: Medicare PPO | Admitting: Vascular Surgery

## 2016-08-15 ENCOUNTER — Ambulatory Visit (HOSPITAL_COMMUNITY)
Admission: RE | Admit: 2016-08-15 | Discharge: 2016-08-15 | Disposition: A | Discharge status: Home / Self Care | Payer: Medicare PPO | Source: Ambulatory Visit | Attending: Vascular Surgery | Admitting: Vascular Surgery

## 2016-08-15 ENCOUNTER — Encounter: Payer: Self-pay | Admitting: Vascular Surgery

## 2016-08-15 VITALS — BP 165/77 | HR 71 | Temp 98.6°F | Resp 20 | Ht 64.0 in | Wt 149.0 lb

## 2016-08-15 DIAGNOSIS — I8393 Asymptomatic varicose veins of bilateral lower extremities: Secondary | ICD-10-CM | POA: Insufficient documentation

## 2016-08-15 DIAGNOSIS — I83893 Varicose veins of bilateral lower extremities with other complications: Secondary | ICD-10-CM | POA: Diagnosis not present

## 2016-08-15 NOTE — Progress Notes (Signed)
[facility] [place]  Patient name: Laura Ellis MRN: [id number] DOB: 1933-03-09 Sex: female  REASON FOR CONSULT: Painful varicosities and bleeding from lower telangiectasia right leg  HPI: Laura Ellis is a 83 y.o. female, who is here today for evaluation of lower from the venous pathology. She is very pleasant active 83 year old female with a long history of lower from the venous pathology. She had been treated by another vein Center and her left leg and this proximal me 10 years ago and is here for further discussion. She's had progressive changes most particularly in her right leg with large varices over her anterior thigh extending to her lateral knee and calf. She also has very prominent telangiectasia over her distal pretibial area and has had bleeding on occasion from these. This is always been able to be stopped just with the local pressure. His not have any history of arterial disease and does not have any history of cardiac disease. She does have nocturnal cramps in both legs and has had some improvement with gabapentin.  Past Medical History:  Diagnosis Date  . Arthritis   . Hypertension   . Osteopenia     No family history on file.  SOCIAL HISTORY: Social History   Social History  . Marital status: Married    Spouse name: N/A  . Number of children: N/A  . Years of education: N/A   Occupational History  . Not on file.   Social History Main Topics  . Smoking status: Former Research scientist (life sciences)  . Smokeless tobacco: Former Systems developer    Quit date: 12/03/2001  . Alcohol use Yes     Comment: occ  . Drug use: No  . Sexual activity: Not on file   Other Topics Concern  . Not on file   Social History Narrative  . No narrative on file    Allergies  Allergen Reactions  . Ace Inhibitors     Cough  . Alendronate Sodium     esophagitis  . Ezetimibe     (Zetia)  . Fenofibrate     arthralgia  . Rosuvastatin Calcium   .  Statins     Muscle aches    Current Outpatient Prescriptions  Medication Sig Dispense Refill  . Ascorbic Acid (VITAMIN C) 1000 MG tablet Take 1,000 mg by mouth daily.    [name] (BOSWELLIA PO) Take 250 mg by mouth. Patient takes 2 daily    . chlorthalidone (HYGROTON) 25 MG tablet Take 1 tablet (25 mg total) by mouth daily. 90 tablet 1  . Coenzyme Q10 (COQ10) 100 MG CAPS Take by mouth daily.    . Cyanocobalamin (VITAMIN B-12 PO) Take 1 tablet by mouth daily.    [name] escitalopram (LEXAPRO) 5 MG tablet Take 1 tablet (5 mg total) by mouth at bedtime. 90 tablet 1  . gabapentin (NEURONTIN) 100 MG capsule Take 1-3 capsules (100-300 mg total) by mouth 2 (two) times daily. For leg cramps/pain 180 capsule 3  . losartan (COZAAR) 50 MG tablet TAKE ONE TABLET BY MOUTH TWICE DAILY 180 tablet 1  . Methylsulfonylmethane (MSM PO) Take 1 tablet by mouth 2 (two) times daily.    . Multiple Vitamin (MULTIVITAMIN WITH MINERALS) TABS tablet Take 1 tablet by mouth daily.    [name] NAPROXEN PO Take 1 tablet by mouth 2 (two) times daily.    . Omega-3 Fatty Acids (FISH OIL PO) Take 1 capsule by mouth daily.    [name] OVER THE COUNTER  MEDICATION Take 1 tablet by mouth daily.    . pneumococcal 13-valent conjugate vaccine (PREVNAR 13) SUSP injection     . Red Yeast Rice 600 MG CAPS Take 600 mg by mouth.    . Turmeric Curcumin 500 MG CAPS Take by mouth.    . Urea 40 % GEL Apply to abnormal nail/skin as directed 25 mL 1  . Urea-Hyaluronate Sodium (UREA NAIL) 40 & 0.2 % KIT Apply 1 application topically as directed. To affected skin/nail 1- 3 times daily. 1 kit 1   No current facility-administered medications for this visit.     REVIEW OF SYSTEMS:  '[X]'$  denotes positive finding, '[ ]'$  denotes negative finding Cardiac  Comments:  Chest pain or chest pressure:    Shortness of breath upon exertion:    Short of breath when lying flat:    Irregular heart rhythm:        Vascular    Pain in calf, thigh, or hip brought on  by ambulation: x   Pain in feet at night that wakes you up from your sleep:  x   Blood clot in your veins:    Leg swelling:         Pulmonary    Oxygen at home:    Productive cough:     Wheezing:         Neurologic    Sudden weakness in arms or legs:     Sudden numbness in arms or legs:     Sudden onset of difficulty speaking or slurred speech:    Temporary loss of vision in one eye:     Problems with dizziness:  x       Gastrointestinal    Blood in stool:     Vomited blood:         Genitourinary    Burning when urinating:     Blood in urine:        Psychiatric    Major depression:         Hematologic    Bleeding problems:    Problems with blood clotting too easily:        Skin    Rashes or ulcers:        Constitutional    Fever or chills: x     PHYSICAL EXAM: Vitals:   08/15/16 1517  BP: (!) 165/77  Pulse: 71  Resp: 20  Temp: 98.6 F (37 C)  TempSrc: Oral  SpO2: 97%  Weight: 149 lb (67.6 kg)  Height: '5\' 4"'$  (1.626 m)    GENERAL: The patient is a well-nourished female, in no acute distress. The vital signs are documented above. CARDIOVASCULAR: 2+ radial and 2+ femoral pulses. 1+ dorsalis pedis pulses bilaterally PULMONARY: There is good air exchange  ABDOMEN: Soft and non-tender  MUSCULOSKELETAL: There are no major deformities or cyanosis. NEUROLOGIC: No focal weakness or paresthesias are detected. SKIN: There are no ulcers or rashes noted. PSYCHIATRIC: The patient has a normal affect. 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 Also prominent telangiectasia which are raised above the skin on the distal pretibial area  DATA:  Lower from the venous duplex evaluation reveals reflux in the common femoral vein only in the deep system. She does have reflux in the saphenofemoral junction and this does extend into and anterior accessory branch on the right.  MEDICAL  ISSUES: I imaged the veins with SonoSite ultrasound on  the right as well and this does show anterior accessory vein with extension into these large plexus of varicosities which are causing her discomfort. She did wear her compression garments in the past but has not worn these recently. We have instructed her on the use of thigh high compression garments. She does take naproxen for discomfort and also elevation when possible. We'll see her again in 3 months for continued discussion. She would be an excellent candidate for ablation of her anterior accessory branch of her great saphenous vein and stab phlebectomy of these multiple large tributary varicosities. She understands that this would be in a staged fashion for insurance reasons. We will see her again in 3 months to determine the effect of conservative treatment on her.   Rosetta Posner, MD FACS [facility] Tel [phone] Pager [phone]

## 2016-09-17 ENCOUNTER — Encounter: Payer: Self-pay | Admitting: Osteopathic Medicine

## 2016-09-17 ENCOUNTER — Ambulatory Visit (INDEPENDENT_AMBULATORY_CARE_PROVIDER_SITE_OTHER): Payer: Medicare PPO | Admitting: Osteopathic Medicine

## 2016-09-17 DIAGNOSIS — I10 Essential (primary) hypertension: Secondary | ICD-10-CM | POA: Diagnosis not present

## 2016-09-17 MED ORDER — CHLORTHALIDONE 25 MG PO TABS: 25.0000 mg | ORAL_TABLET | Freq: Every day | ORAL | 1 refills | Status: DC

## 2016-09-17 NOTE — Patient Instructions (Addendum)
Plan:  Restart the Chlorthalidone blood pressure medication   Plan to come back to the office in 2 weeks to recheck the blood pressure and will also check the labs at that visit to make sure kidney function and electrolytes are okay on the chlorthalidone

## 2016-09-17 NOTE — Progress Notes (Signed)
HPI: Laura Ellis is a 83 y.o. female  who presents to [place] today, 09/17/16,  for chief complaint of:  Chief Complaint  Patient presents with  . Follow-up    BLOOD PRESSURE/ REPEAT READING WAS 171/84 PULSE 75    Home BP cuff >10 point difference from our monitors - home cuff reading higher. No chest pain, pressure, shortness of breath. Has been taking the losartan but has not been taking the chlorthalidone for several months. BP today a bit elevated.   Past medical history, surgical history, social history and family history reviewed.  Patient Active Problem List   Diagnosis Date Noted  . Chronic kidney disease 02/23/2016  . Trochanteric bursitis of left hip 01/17/2016  . Left ankle pain 01/17/2016  . Hypothyroidism 07/05/2015  . Osteoporosis 05/26/2015  . Cataract 05/26/2015  . Bilateral knee pain 05/26/2015  . Bilateral low back pain without sciatica 05/26/2015  . Essential hypertension 05/26/2015    Current medication list and allergy/intolerance information reviewed.   Current Outpatient Prescriptions on File Prior to Visit  Medication Sig Dispense Refill  . Ascorbic Acid (VITAMIN C) 1000 MG tablet Take 1,000 mg by mouth daily.    [name] (BOSWELLIA PO) Take 250 mg by mouth. Patient takes 2 daily    . chlorthalidone (HYGROTON) 25 MG tablet Take 1 tablet (25 mg total) by mouth daily. 90 tablet 1  . Coenzyme Q10 (COQ10) 100 MG CAPS Take by mouth daily.    . Cyanocobalamin (VITAMIN B-12 PO) Take 1 tablet by mouth daily.    [name] escitalopram (LEXAPRO) 5 MG tablet Take 1 tablet (5 mg total) by mouth at bedtime. 90 tablet 1  . gabapentin (NEURONTIN) 100 MG capsule Take 1-3 capsules (100-300 mg total) by mouth 2 (two) times daily. For leg cramps/pain 180 capsule 3  . losartan (COZAAR) 50 MG tablet TAKE ONE TABLET BY MOUTH TWICE DAILY 180 tablet 1  . Methylsulfonylmethane (MSM PO) Take 1 tablet by mouth 2 (two) times daily.    .  Multiple Vitamin (MULTIVITAMIN WITH MINERALS) TABS tablet Take 1 tablet by mouth daily.    [name] NAPROXEN PO Take 1 tablet by mouth 2 (two) times daily.    . Omega-3 Fatty Acids (FISH OIL PO) Take 1 capsule by mouth daily.    [name] OVER THE COUNTER MEDICATION Take 1 tablet by mouth daily.    . pneumococcal 13-valent conjugate vaccine (PREVNAR 13) SUSP injection     . Red Yeast Rice 600 MG CAPS Take 600 mg by mouth.    . Turmeric Curcumin 500 MG CAPS Take by mouth.    . Urea 40 % GEL Apply to abnormal nail/skin as directed 25 mL 1  . Urea-Hyaluronate Sodium (UREA NAIL) 40 & 0.2 % KIT Apply 1 application topically as directed. To affected skin/nail 1- 3 times daily. 1 kit 1   No current facility-administered medications on file prior to visit.    Allergies  Allergen Reactions  . Ace Inhibitors     Cough  . Alendronate Sodium     esophagitis  . Ezetimibe     (Zetia)  . Fenofibrate     arthralgia  . Rosuvastatin Calcium   . Statins     Muscle aches      Review of Systems:  Constitutional: No recent illness  HEENT: No  headache  Cardiac: No  chest pain, No  pressure, No palpitations  Respiratory:  No  shortness of breath. No  Cough  Musculoskeletal: No new myalgia/arthralgia  Neurologic: No  weakness, No  Dizziness   Exam:  BP (!) 152/84   Pulse 76   Ht '5\' 4"'$  (1.626 m)   Wt 148 lb (67.1 kg)   BMI 25.40 kg/m   Constitutional: VS see above. General Appearance: alert, well-developed, well-nourished, NAD  Eyes: Normal lids and conjunctive, non-icteric sclera  Ears, Nose, Mouth, Throat: MMM, Normal external inspection ears/nares/mouth/lips/gums.  Neck: No masses, trachea midline.   Respiratory: Normal respiratory effort. no wheeze, no rhonchi, no rales  Cardiovascular: S1/S2 normal, no murmur, no rub/gallop auscultated. RRR.   Musculoskeletal: Gait normal. Symmetric and independent movement of all extremities  Neurological: Normal balance/coordination. No  tremor.  Skin: warm, dry, intact.   Psychiatric: Normal judgment/insight. Normal mood and affect. Oriented x3.    Depression screen [facility] 2/9 09/17/2016 06/02/2015  Decreased Interest 1 0  Down, Depressed, Hopeless 0 0  PHQ - 2 Score 1 0      ASSESSMENT/PLAN:   Essential hypertension - Plan: chlorthalidone (HYGROTON) 25 MG tablet    Patient Instructions  Plan:  Restart the Chlorthalidone blood pressure medication   Plan to come back to the office in 2 weeks to recheck the blood pressure and will also check the labs at that visit to make sure kidney function and electrolytes are okay on the chlorthalidone     Follow-up plan: Return in about 2 weeks (around 10/01/2016) for blood pressure and lab recheck .  Visit summary with medication list and pertinent instructions was printed for patient to review, alert [place] if any changes needed. All questions at time of visit were answered - patient instructed to contact office with any additional concerns. ER/RTC precautions were reviewed with the patient and understanding verbalized.

## 2016-09-19 ENCOUNTER — Ambulatory Visit: Payer: Commercial Managed Care - HMO | Admitting: Osteopathic Medicine

## 2016-10-01 ENCOUNTER — Encounter: Payer: Self-pay | Admitting: Osteopathic Medicine

## 2016-10-01 ENCOUNTER — Ambulatory Visit (INDEPENDENT_AMBULATORY_CARE_PROVIDER_SITE_OTHER): Payer: Medicare PPO | Admitting: Osteopathic Medicine

## 2016-10-01 VITALS — BP 150/75 | HR 87 | Ht 64.0 in | Wt 145.0 lb

## 2016-10-01 DIAGNOSIS — I10 Essential (primary) hypertension: Secondary | ICD-10-CM

## 2016-10-01 MED ORDER — AMLODIPINE BESYLATE 5 MG PO TABS: 5.0000 mg | ORAL_TABLET | Freq: Every day | ORAL | 1 refills | Status: DC

## 2016-10-01 NOTE — Progress Notes (Signed)
HPI: Laura Ellis is a 83 y.o. female  who presents to [place] today, 10/01/16,  for chief complaint of:  Chief Complaint  Patient presents with  . Follow-up    blood pressure and labs    Last visit BP was high, we restarted the chlorthalidone which she had not been taking, had been taking only losartan. BP today 162/75 on first check, 150/75 on manual repeat. Home monitor at visit 09/17/16 was >10 mmHg different from our monitors, but she is still measuring home BP and brings these numbers in to office: systolic typically 379'K-WIO 973'Z, diastolic typically 32'D-92'E.     Past medical history, surgical history, social history and family history reviewed.  Patient Active Problem List   Diagnosis Date Noted  . Chronic kidney disease 02/23/2016  . Trochanteric bursitis of left hip 01/17/2016  . Left ankle pain 01/17/2016  . Hypothyroidism 07/05/2015  . Osteoporosis 05/26/2015  . Cataract 05/26/2015  . Bilateral knee pain 05/26/2015  . Bilateral low back pain without sciatica 05/26/2015  . Essential hypertension 05/26/2015    Current medication list and allergy/intolerance information reviewed.   Current Outpatient Prescriptions on File Prior to Visit  Medication Sig Dispense Refill  . Ascorbic Acid (VITAMIN C) 1000 MG tablet Take 1,000 mg by mouth daily.    . chlorthalidone (HYGROTON) 25 MG tablet Take 1 tablet (25 mg total) by mouth daily. 90 tablet 1  . Coenzyme Q10 (COQ10) 100 MG CAPS Take by mouth daily.    . Cyanocobalamin (VITAMIN B-12 PO) Take 1 tablet by mouth daily.    [name] escitalopram (LEXAPRO) 5 MG tablet Take 1 tablet (5 mg total) by mouth at bedtime. 90 tablet 1  . latanoprost (XALATAN) 0.005 % ophthalmic solution     . losartan (COZAAR) 50 MG tablet TAKE ONE TABLET BY MOUTH TWICE DAILY 180 tablet 1  . Multiple Vitamin (MULTIVITAMIN WITH MINERALS) TABS tablet Take 1 tablet by mouth daily.    [name] NAPROXEN PO Take 1 tablet by  mouth 2 (two) times daily.    . Omega-3 Fatty Acids (FISH OIL PO) Take 1 capsule by mouth daily.    [name] OVER THE COUNTER MEDICATION Take 1 tablet by mouth daily.    . pneumococcal 13-valent conjugate vaccine (PREVNAR 13) SUSP injection     . Red Yeast Rice 600 MG CAPS Take 600 mg by mouth.    . Urea 40 % GEL Apply to abnormal nail/skin as directed 25 mL 1   No current facility-administered medications on file prior to visit.    Allergies  Allergen Reactions  . Ace Inhibitors     Cough  . Alendronate Sodium     esophagitis  . Ezetimibe     (Zetia)  . Fenofibrate     arthralgia  . Rosuvastatin Calcium   . Statins     Muscle aches      Review of Systems:  Constitutional: No recent illness  HEENT: No  headache, no vision change  Cardiac: No  chest pain, No  pressure, No palpitations  Neurologic: No  weakness, No  Dizziness  Exam:  BP (!) 150/75   Pulse 87   Ht 5\' 4"  (1.626 m)   Wt 145 lb (65.8 kg)   BMI 24.89 kg/m   Constitutional: VS see above. General Appearance: alert, well-developed, well-nourished, NAD  Neck: No masses, trachea midline.   Respiratory: Normal respiratory effort. no wheeze, no rhonchi, no rales  Cardiovascular: S1/S2 normal, no murmur,  no rub/gallop auscultated. RRR. No LE edema  Musculoskeletal: Gait normal. Symmetric and independent movement of all extremities  Neurological: Normal balance/coordination. No tremor.     ASSESSMENT/PLAN:   Hypertension, unspecified type - Plan: BASIC METABOLIC PANEL WITH GFR    Patient Instructions  For blood pressure:  Continue Chlorthalidone and Losartan as you're doing  Labs today   Start Amlodipine 5 mg per day  Nurse visit 2 weeks to recheck   Goal BP: 140/90 or less  If at goal, will send 3 mos supply meds  If higher than goal, will increase dose Amlodipine to 10 mg daily and follow up in another 2 weeks  If still not at goal then, will need appt w/ me    Follow-up plan: Return in  about 2 weeks (around 10/15/2016) for nurse visit: check blood pressure .  Visit summary with medication list and pertinent instructions was printed for patient to review, alert [place] if any changes needed. All questions at time of visit were answered - patient instructed to contact office with any additional concerns. ER/RTC precautions were reviewed with the patient and understanding verbalized.

## 2016-10-01 NOTE — Patient Instructions (Signed)
For blood pressure:  Continue Chlorthalidone and Losartan as you're doing  Labs today   Start Amlodipine 5 mg per day  Nurse visit 2 weeks to recheck   Goal BP: 140/90 or less  If at goal, will send 3 mos supply meds  If higher than goal, will increase dose Amlodipine to 10 mg daily and follow up in another 2 weeks  If still not at goal then, will need appt w/ me

## 2016-10-02 ENCOUNTER — Telehealth: Payer: Self-pay | Admitting: Osteopathic Medicine

## 2016-10-02 DIAGNOSIS — N183 Chronic kidney disease, stage 3 unspecified: Secondary | ICD-10-CM

## 2016-10-02 DIAGNOSIS — N184 Chronic kidney disease, stage 4 (severe): Secondary | ICD-10-CM

## 2016-10-02 LAB — BASIC METABOLIC PANEL WITH GFR
BUN: 48 mg/dL — AB (ref 7–25)
CHLORIDE: 102 mmol/L (ref 98–110)
CO2: 26 mmol/L (ref 20–31)
Calcium: 10.2 mg/dL (ref 8.6–10.4)
Creat: 1.83 mg/dL — ABNORMAL HIGH (ref 0.60–0.88)
GFR, Est African American: 29 mL/min — ABNORMAL LOW (ref >=60)
GFR, Est Non African American: 26 mL/min — ABNORMAL LOW (ref >=60)
GLUCOSE: 76 mg/dL (ref 65–99)
POTASSIUM: 4.6 mmol/L (ref 3.5–5.3)
Sodium: 140 mmol/L (ref 135–146)

## 2016-10-05 NOTE — Telephone Encounter (Signed)
kIDNEY LABS

## 2016-10-16 ENCOUNTER — Ambulatory Visit (INDEPENDENT_AMBULATORY_CARE_PROVIDER_SITE_OTHER): Payer: Medicare PPO | Admitting: Osteopathic Medicine

## 2016-10-16 VITALS — BP 136/54 | HR 79

## 2016-10-16 DIAGNOSIS — I10 Essential (primary) hypertension: Secondary | ICD-10-CM

## 2016-10-16 NOTE — Progress Notes (Signed)
Left recommendation on Pt's VM, callback provided for any questions.

## 2016-10-16 NOTE — Progress Notes (Signed)
Pt came into clinic today for BP check. At last OV Pt's Rx's were adjusted and the amlodipine was increased to 10mg . Pt tolerated increase well. Pt's BP was at goal in office today, she did bring her home machine. It was comparable to ours. Pt's home readings are as follows:  10/02/16: 160/80 (85) 10/04/16: 145/78 (72) 10/07/16: 119/77 (88) 10/08/16: 128/80 (68) 10/09/16: 138/74 (91) 10/10/16: 147/66 (88) 10/11/16: 132/84 (89) 10/13/16: 132/73 (89) 10/15/16: 135/68 (80)  Advised Pt we would contact her with any changes.

## 2016-10-16 NOTE — Progress Notes (Signed)
Would continue with current medications - thanks!

## 2016-10-17 LAB — VITAMIN D 25 HYDROXY (VIT D DEFICIENCY, FRACTURES): Vit D, 25-Hydroxy: 42 ng/mL (ref 30–100)

## 2016-10-17 LAB — BASIC METABOLIC PANEL WITH GFR
BUN: 48 mg/dL — AB (ref 7–25)
CALCIUM: 9.1 mg/dL (ref 8.6–10.4)
CO2: 20 mmol/L (ref 20–31)
CREATININE: 1.72 mg/dL — AB (ref 0.60–0.88)
Chloride: 107 mmol/L (ref 98–110)
GFR, EST AFRICAN AMERICAN: 32 mL/min — AB (ref >=60)
GFR, EST NON AFRICAN AMERICAN: 27 mL/min — AB (ref >=60)
GLUCOSE: 90 mg/dL (ref 65–99)
Potassium: 4.5 mmol/L (ref 3.5–5.3)
Sodium: 137 mmol/L (ref 135–146)

## 2016-10-17 LAB — PHOSPHORUS: Phosphorus: 4.1 mg/dL (ref 2.1–4.3)

## 2016-10-17 LAB — MICROALBUMIN, URINE: Microalb, Ur: 0.7 mg/dL

## 2016-10-17 LAB — URINALYSIS, MICROSCOPIC ONLY
Bacteria, UA: NONE SEEN [HPF]
CASTS: NONE SEEN [LPF]
CRYSTALS: NONE SEEN [HPF]
YEAST: NONE SEEN [HPF]

## 2016-10-17 LAB — CALCIUM, IONIZED: Calcium, Ion: 4.8 mg/dL (ref 4.8–5.6)

## 2016-10-17 LAB — PARATHYROID HORMONE, INTACT (NO CA): PTH: 57 pg/mL (ref 14–64)

## 2016-10-17 NOTE — Addendum Note (Signed)
Addended by: Maryla Morrow on: 10/17/2016 03:12 PM   Modules accepted: Orders

## 2016-10-22 ENCOUNTER — Telehealth: Payer: Self-pay

## 2016-10-22 NOTE — Telephone Encounter (Signed)
Need to know BP numbers. She can stop Losartan but if symptoms are concerning her she should be seen in the office - bring home BP cuff

## 2016-10-22 NOTE — Telephone Encounter (Signed)
Patient called stated that her blood pressure is dropping fast and she is getting dizzy again. Please advise. Laura Ellis,CMA

## 2016-10-22 NOTE — Telephone Encounter (Signed)
LEFT MESSAGE ON PATIENT VM WITH ADVISE AS NOTED BELOW. Jaiquan Temme,CMA

## 2016-10-23 NOTE — Telephone Encounter (Signed)
Spoke to patient she stated that her blood pressure reading yesterday was 105/67 p 86 and on Sunday it was 118/58 p 82. She stated that she is still having dizziness but she thinks it is coming from medications , she stated that it is on the warning labels of the medication that she is taking, please advise. Laura Ellis,CMA

## 2016-10-23 NOTE — Telephone Encounter (Signed)
Advice is the same: She should stop the losartan, and if she is having symptoms of low blood pressure, she should come in for a visit and we should still verify her home blood pressure cuff to make sure it is accurate.

## 2016-10-23 NOTE — Telephone Encounter (Signed)
Patient has been advised and she verbally understands. Cotina Freedman,CMA

## 2016-11-12 ENCOUNTER — Encounter: Payer: Self-pay | Admitting: Vascular Surgery

## 2016-11-20 ENCOUNTER — Ambulatory Visit (INDEPENDENT_AMBULATORY_CARE_PROVIDER_SITE_OTHER): Payer: Medicare PPO | Admitting: Vascular Surgery

## 2016-11-20 ENCOUNTER — Encounter: Payer: Self-pay | Admitting: Vascular Surgery

## 2016-11-20 VITALS — BP 148/72 | HR 70 | Temp 97.6°F | Resp 16 | Ht 64.0 in | Wt 146.0 lb

## 2016-11-20 DIAGNOSIS — I83893 Varicose veins of bilateral lower extremities with other complications: Secondary | ICD-10-CM | POA: Diagnosis not present

## 2016-11-20 NOTE — Progress Notes (Signed)
[facility] [place]  Patient name: Laura Ellis MRN: [id number] DOB: [birth year] Sex: female  REASON FOR VISIT: Follow-up painful varicosities right leg  HPI: Laura Ellis is a 83 y.o. female here today for follow-up. She has been very compliant with her thigh high compression and elevation. She continues to have discomfort over her large plexus of varicosities extend throughout her anterior thigh over her anterior knee and pretibial area on the right. Portions she has had no DVT and no bleeding from these.  Past Medical History:  Diagnosis Date  . Arthritis   . Hypertension   . Osteopenia     History reviewed. No pertinent family history.  SOCIAL HISTORY: Social History  Substance Use Topics  . Smoking status: Former Research scientist (life sciences)  . Smokeless tobacco: Former Systems developer    Quit date: 12/03/2001  . Alcohol use Yes     Comment: occ    Allergies  Allergen Reactions  . Ace Inhibitors     Cough  . Alendronate Sodium     esophagitis  . Ezetimibe     (Zetia)  . Fenofibrate     arthralgia  . Rosuvastatin Calcium   . Statins     Muscle aches    Current Outpatient Prescriptions  Medication Sig Dispense Refill  . amLODipine (NORVASC) 5 MG tablet Take 1 tablet (5 mg total) by mouth daily. 30 tablet 1  . Ascorbic Acid (VITAMIN C) 1000 MG tablet Take 1,000 mg by mouth daily.    . chlorthalidone (HYGROTON) 25 MG tablet Take 1 tablet (25 mg total) by mouth daily. 90 tablet 1  . Coenzyme Q10 (COQ10) 100 MG CAPS Take by mouth daily.    . Cyanocobalamin (VITAMIN B-12 PO) Take 1 tablet by mouth daily.    [name] escitalopram (LEXAPRO) 5 MG tablet Take 1 tablet (5 mg total) by mouth at bedtime. 90 tablet 1  . latanoprost (XALATAN) 0.005 % ophthalmic solution     . losartan (COZAAR) 50 MG tablet TAKE ONE TABLET BY MOUTH TWICE DAILY 180 tablet 1  . Multiple Vitamin (MULTIVITAMIN WITH MINERALS) TABS tablet Take 1 tablet by mouth daily.      [name] NAPROXEN PO Take 1 tablet by mouth 2 (two) times daily.    . Omega-3 Fatty Acids (FISH OIL PO) Take 1 capsule by mouth daily.    [name] OVER THE COUNTER MEDICATION Take 1 tablet by mouth daily.    . pneumococcal 13-valent conjugate vaccine (PREVNAR 13) SUSP injection     . Red Yeast Rice 600 MG CAPS Take 600 mg by mouth.    . Urea 40 % GEL Apply to abnormal nail/skin as directed 25 mL 1   No current facility-administered medications for this visit.     REVIEW OF SYSTEMS:  [X]  denotes positive finding, [ ]  denotes negative finding Cardiac  Comments:  Chest pain or chest pressure:    Shortness of breath upon exertion:    Short of breath when lying flat:    Irregular heart rhythm:        Vascular    Pain in calf, thigh, or hip brought on by ambulation:    Pain in feet at night that wakes you up from your sleep:     Blood clot in your veins:    Leg swelling:  x         PHYSICAL EXAM: Vitals:   11/20/16 1335 11/20/16 1336  BP: (!) 150/73 (!) 148/72  Pulse: 70  Resp: 16   Temp: 97.6 F (36.4 C)   SpO2: 97%   Weight: 146 lb (66.2 kg)   Height: 5\' 4"  (1.626 m)     GENERAL: The patient is a well-nourished female, in no acute distress. The vital signs are documented above. CARDIOVASCULAR: Palpable dorsalis pedis pulses bilaterally. Large plexus of varicosities throughout her anterior thigh on the right extending over her anterior knee and pretibial area. PULMONARY: There is good air exchange  MUSCULOSKELETAL: There are no major deformities or cyanosis. NEUROLOGIC: No focal weakness or paresthesias are detected. SKIN: There are no ulcers or rashes noted. PSYCHIATRIC: The patient has a normal affect.  DATA:  Prior venous duplex from 08/15/2016 was reviewed. This showed reflux in her anterior great saphenous vein on the right. I reimage this today with ultrasound and this does show reflux extending directly into her varicosities.  MEDICAL ISSUES: Failed conservative therapy. I  have recommended laser ablation of her anterior great saphenous vein on the right. Explained that she may require stab phlebectomy to follow in that this is required to be in a staged fashion by Medicare. Recommend ablation now and then will follow-up in 3 months to determine if stab phlebectomy is indicated to follow    Rosetta Posner, MD [facility] Tel [phone] Pager [phone]

## 2016-11-21 ENCOUNTER — Other Ambulatory Visit: Payer: Self-pay | Admitting: *Deleted

## 2016-11-21 DIAGNOSIS — I83811 Varicose veins of right lower extremities with pain: Secondary | ICD-10-CM

## 2016-12-05 ENCOUNTER — Other Ambulatory Visit: Payer: Self-pay | Admitting: Osteopathic Medicine

## 2016-12-18 ENCOUNTER — Encounter: Payer: Self-pay | Admitting: Vascular Surgery

## 2016-12-25 ENCOUNTER — Encounter: Payer: Self-pay | Admitting: Vascular Surgery

## 2016-12-27 ENCOUNTER — Encounter: Payer: Self-pay | Admitting: Vascular Surgery

## 2016-12-27 ENCOUNTER — Ambulatory Visit (INDEPENDENT_AMBULATORY_CARE_PROVIDER_SITE_OTHER): Payer: Medicare PPO | Admitting: Vascular Surgery

## 2016-12-27 ENCOUNTER — Other Ambulatory Visit: Payer: Medicare PPO | Admitting: Vascular Surgery

## 2016-12-27 VITALS — BP 149/71 | HR 77 | Temp 97.2°F | Resp 16 | Ht 64.0 in | Wt 146.0 lb

## 2016-12-27 DIAGNOSIS — I83893 Varicose veins of bilateral lower extremities with other complications: Secondary | ICD-10-CM | POA: Diagnosis not present

## 2016-12-27 HISTORY — PX: ENDOVENOUS ABLATION SAPHENOUS VEIN W/ LASER: SUR449

## 2016-12-27 NOTE — Progress Notes (Signed)
     Laser Ablation Procedure    Date: 12/27/2016   Jamal Collin Catala DOB:[birth year]  Consent signed: Yes    Surgeon:  Dr. Sherren Mocha Hakan Nudelman  Procedure: Laser Ablation: right Greater Saphenous Vein anterior accessory branch  BP (!) 149/71 (BP Location: Right Arm, Patient Position: Sitting, Cuff Size: Large)   Pulse 77   Temp (!) 97.2 F (36.2 C) (Oral)   Resp 16   Ht 5\' 4"  (1.626 m)   Wt 146 lb (66.2 kg)   SpO2 100%   BMI 25.06 kg/m   Tumescent Anesthesia: 250 cc 0.9% NaCl with 50 cc Lidocaine HCL with 1% Epi and 15 cc 8.4% NaHCO3  Local Anesthesia: 2 cc Lidocaine HCL and NaHCO3 (ratio 2:1)  15 watts continuous mode        Total energy: 657.9 Joules   Total time: 0:43 sec.      Patient tolerated procedure well    Description of Procedure:  After marking the course of the secondary varicosities, the patient was placed on the operating table in the supine position, and the right leg was prepped and draped in sterile fashion.   Local anesthetic was administered and under ultrasound guidance the saphenous vein was accessed with a micro needle and guide wire; then the mirco puncture sheath was placed.  A guide wire was inserted saphenofemoral junction , followed by a 5 french sheath.  The position of the sheath and then the laser fiber below the junction was confirmed using the ultrasound.  Tumescent anesthesia was administered along the course of the saphenous vein using ultrasound guidance. The patient was placed in Trendelenburg position and protective laser glasses were placed on patient and staff, and the laser was fired at 15 watts continuous mode advancing 1-55mm/second for a total of 657.9 joules.      Steri strip was applied to IV insertion site and ABD pads and thigh high compression stockings were applied.  Ace wrap bandages were applied at the top of the saphenofemoral junction. Blood loss was less than 15 cc.  The patient ambulated out of the operating room having tolerated  the procedure well.  Uneventful ablation of anterior accessory branch great saphenous vein ablation in her proximal thigh. These do lead directly into the large varicosities which are causing her discomfort in her medial thigh and knee and calf.

## 2016-12-28 ENCOUNTER — Encounter: Payer: Self-pay | Admitting: Vascular Surgery

## 2017-01-03 ENCOUNTER — Ambulatory Visit (INDEPENDENT_AMBULATORY_CARE_PROVIDER_SITE_OTHER): Payer: Medicare PPO | Admitting: Vascular Surgery

## 2017-01-03 ENCOUNTER — Ambulatory Visit (HOSPITAL_COMMUNITY)
Admission: RE | Admit: 2017-01-03 | Discharge: 2017-01-03 | Disposition: A | Discharge status: Home / Self Care | Payer: Medicare PPO | Source: Ambulatory Visit | Attending: Vascular Surgery | Admitting: Vascular Surgery

## 2017-01-03 ENCOUNTER — Encounter: Payer: Self-pay | Admitting: Vascular Surgery

## 2017-01-03 VITALS — BP 159/84 | HR 85 | Temp 97.4°F | Resp 20 | Ht 64.0 in | Wt 145.0 lb

## 2017-01-03 DIAGNOSIS — I83893 Varicose veins of bilateral lower extremities with other complications: Secondary | ICD-10-CM | POA: Diagnosis not present

## 2017-01-03 DIAGNOSIS — I83811 Varicose veins of right lower extremities with pain: Secondary | ICD-10-CM | POA: Diagnosis present

## 2017-01-03 NOTE — Progress Notes (Signed)
[facility] [place]  Patient name: Laura Ellis MRN: [id number] DOB: 08-May-1933 Sex: female  REASON FOR VISIT: Follow-up of right anterior accessory great saphenous vein ablation on 12/27/2016  HPI: Laura Ellis is a 83 y.o. female here for follow-up. She's had minimal discomfort associated with the procedure. She did report that the Motrin upset her stomach. She has discontinued this.  Past Medical History:  Diagnosis Date  . Arthritis   . Hypertension   . Osteopenia     History reviewed. No pertinent family history.  SOCIAL HISTORY: Social History  Substance Use Topics  . Smoking status: Former Research scientist (life sciences)  . Smokeless tobacco: Former Systems developer    Quit date: 12/03/2001  . Alcohol use Yes     Comment: occ    Allergies  Allergen Reactions  . Ace Inhibitors     Cough  . Alendronate Sodium     esophagitis  . Ezetimibe     (Zetia)  . Fenofibrate     arthralgia  . Rosuvastatin Calcium   . Statins     Muscle aches    Current Outpatient Prescriptions  Medication Sig Dispense Refill  . amLODipine (NORVASC) 5 MG tablet TAKE ONE TABLET BY MOUTH EVERY DAY 30 tablet 2  . Ascorbic Acid (VITAMIN C) 1000 MG tablet Take 1,000 mg by mouth daily.    . chlorthalidone (HYGROTON) 25 MG tablet Take 1 tablet (25 mg total) by mouth daily. 90 tablet 1  . Coenzyme Q10 (COQ10) 100 MG CAPS Take by mouth daily.    . Cyanocobalamin (VITAMIN B-12 PO) Take 1 tablet by mouth daily.    [name] escitalopram (LEXAPRO) 5 MG tablet Take 1 tablet (5 mg total) by mouth at bedtime. 90 tablet 1  . latanoprost (XALATAN) 0.005 % ophthalmic solution     . Multiple Vitamin (MULTIVITAMIN WITH MINERALS) TABS tablet Take 1 tablet by mouth daily.    [name] NAPROXEN PO Take 1 tablet by mouth 2 (two) times daily.    . Omega-3 Fatty Acids (FISH OIL PO) Take 1 capsule by mouth daily.    [name] OVER THE COUNTER MEDICATION Take 1 tablet by mouth daily.    . pneumococcal  13-valent conjugate vaccine (PREVNAR 13) SUSP injection     . Red Yeast Rice 600 MG CAPS Take 600 mg by mouth.    . Urea 40 % GEL Apply to abnormal nail/skin as directed 25 mL 1  . losartan (COZAAR) 50 MG tablet TAKE ONE TABLET BY MOUTH TWICE DAILY (Patient not taking: Reported on 12/27/2016) 180 tablet 1   No current facility-administered medications for this visit.     REVIEW OF SYSTEMS:  [X]  denotes positive finding, [ ]  denotes negative finding Cardiac  Comments:  Chest pain or chest pressure:    Shortness of breath upon exertion:    Short of breath when lying flat:    Irregular heart rhythm:        Vascular    Pain in calf, thigh, or hip brought on by ambulation:    Pain in feet at night that wakes you up from your sleep:     Blood clot in your veins:    Leg swelling:           PHYSICAL EXAM: Vitals:   01/03/17 1026 01/03/17 1027  BP: (!) 156/82 (!) 159/84  Pulse: 85   Resp: 20   Temp: (!) 97.4 F (36.3 C)   TempSrc: Oral   SpO2: (!) 85%  Weight: 145 lb (65.8 kg)   Height: 5\' 4"  (1.626 m)     GENERAL: The patient is a well-nourished female, in no acute distress. The vital signs are documented above. CARDIOVASCULAR: Palpable dorsalis pedis pulse. Moderate bruising at her ablation site. Palpable thrombosed saphenous vein in the subcutaneous tissue. She still has marked varicosities arising from the ablated vein in her thigh across her knee and in her calf. She also has telangiectasia which had bleeding on the medial aspect. PULMONARY: There is good air exchange  MUSCULOSKELETAL: There are no major deformities or cyanosis. NEUROLOGIC: No focal weakness or paresthesias are detected. SKIN: There are no ulcers or rashes noted. PSYCHIATRIC: The patient has a normal affect.  DATA:  Duplex today reveals closure of her anterior accessory branch of her great saphenous vein on the right with no evidence of DVT  MEDICAL ISSUES: Successful ablation. Will be seen again in 3  months for discussion of possible need for stab stab phlebectomy which I suspect will be the case. This would be greater than 20. Also I will need sclerotherapy to the telangiectasia which bled on her medial calf.    Rosetta Posner, MD FACS [facility] Tel [phone] Pager [phone]

## 2017-03-07 ENCOUNTER — Encounter: Payer: Self-pay | Admitting: Osteopathic Medicine

## 2017-03-07 ENCOUNTER — Ambulatory Visit (INDEPENDENT_AMBULATORY_CARE_PROVIDER_SITE_OTHER): Payer: Medicare PPO | Admitting: Osteopathic Medicine

## 2017-03-07 VITALS — BP 129/74 | HR 87 | Wt 144.0 lb

## 2017-03-07 DIAGNOSIS — I10 Essential (primary) hypertension: Secondary | ICD-10-CM | POA: Diagnosis not present

## 2017-03-07 DIAGNOSIS — R42 Dizziness and giddiness: Secondary | ICD-10-CM | POA: Diagnosis not present

## 2017-03-07 DIAGNOSIS — G8929 Other chronic pain: Secondary | ICD-10-CM | POA: Diagnosis not present

## 2017-03-07 DIAGNOSIS — R7989 Other specified abnormal findings of blood chemistry: Secondary | ICD-10-CM

## 2017-03-07 DIAGNOSIS — Z23 Encounter for immunization: Secondary | ICD-10-CM | POA: Diagnosis not present

## 2017-03-07 DIAGNOSIS — E039 Hypothyroidism, unspecified: Secondary | ICD-10-CM

## 2017-03-07 DIAGNOSIS — M545 Low back pain: Secondary | ICD-10-CM

## 2017-03-07 DIAGNOSIS — N184 Chronic kidney disease, stage 4 (severe): Secondary | ICD-10-CM

## 2017-03-07 NOTE — Progress Notes (Signed)
HPI: Laura Ellis is a 83 y.o. female  who presents to [facility] today, 03/07/17,  for chief complaint of:  Chief Complaint  Patient presents with  . Hypertension  . Dizziness    HTN: Well-controlled on current medications though lately patient has been noticing a bit more dizziness/lightheadedness, especially with postural changes.  No blacking out, vision changes, falling.  No chest pain, pressure, palpitations.  Blood pressures typically in the 120s over 80s but occasionally going up into the 638V or 564P systolic.  Dizziness/lightheadedness: New complaint today, see above.  Had one episode of passing out in 2017.  Concerns on echocardiogram, less than 50% stenosis and right and left internal carotid arteries  Chronic kidney disease: I had previously placed a referral for a nephrologist, does not look like this appointment never got scheduled for her. She states she cancelled it due to not wanting to go to [place] back pain: Recently followed by [facility] orthopedics.  Recently underwent epidural injection to good beneficial effect    Past medical history, surgical history, social history and family history reviewed.  Patient Active Problem List   Diagnosis Date Noted  . CKD (chronic kidney disease) stage 4, GFR 15-29 ml/min (HCC) 02/23/2016  . Trochanteric bursitis of left hip 01/17/2016  . Left ankle pain 01/17/2016  . Hypothyroidism 07/05/2015  . Osteoporosis 05/26/2015  . Cataract 05/26/2015  . Bilateral knee pain 05/26/2015  . Bilateral low back pain without sciatica 05/26/2015  . Essential hypertension 05/26/2015    Current medication list and allergy/intolerance information reviewed.   Current Outpatient Prescriptions on File Prior to Visit  Medication Sig Dispense Refill  . amLODipine (NORVASC) 5 MG tablet TAKE ONE TABLET BY MOUTH EVERY DAY 30 tablet 2  . Ascorbic Acid (VITAMIN C) 1000 MG tablet Take 1,000 mg by  mouth daily.    . chlorthalidone (HYGROTON) 25 MG tablet Take 1 tablet (25 mg total) by mouth daily. 90 tablet 1  . Coenzyme Q10 (COQ10) 100 MG CAPS Take by mouth daily.    . Cyanocobalamin (VITAMIN B-12 PO) Take 1 tablet by mouth daily.    [name] escitalopram (LEXAPRO) 5 MG tablet Take 1 tablet (5 mg total) by mouth at bedtime. 90 tablet 1  . latanoprost (XALATAN) 0.005 % ophthalmic solution     . Multiple Vitamin (MULTIVITAMIN WITH MINERALS) TABS tablet Take 1 tablet by mouth daily.    [name] NAPROXEN PO Take 1 tablet by mouth 2 (two) times daily.    . Omega-3 Fatty Acids (FISH OIL PO) Take 1 capsule by mouth daily.    [name] OVER THE COUNTER MEDICATION Take 1 tablet by mouth daily.    . pneumococcal 13-valent conjugate vaccine (PREVNAR 13) SUSP injection     . Red Yeast Rice 600 MG CAPS Take 600 mg by mouth.    . Urea 40 % GEL Apply to abnormal nail/skin as directed 25 mL 1   No current facility-administered medications on file prior to visit.    Allergies  Allergen Reactions  . Ace Inhibitors     Cough  . Alendronate Sodium     esophagitis  . Ezetimibe     (Zetia)  . Fenofibrate     arthralgia  . Rosuvastatin Calcium   . Statins     Muscle aches      Review of Systems:  Constitutional: No recent illness  HEENT: No  headache, no vision change  Cardiac: No  chest pain, No  pressure, No palpitations  Respiratory:  No  shortness of breath. No  Cough  Gastrointestinal: No  abdominal pain, no change on bowel habits  Musculoskeletal: No new myalgia/arthralgia  Skin: No  Rash  Neurologic: No  weakness, +Dizziness  Psychiatric: No  concerns with depression, No  concerns with anxiety  Exam:  BP 129/74   Pulse 87   Wt 144 lb (65.3 kg)   BMI 24.72 kg/m   Orthostatic VS for the past 24 hrs (Last 3 readings):  BP- Lying Pulse- Lying BP- Sitting Pulse- Sitting BP- Standing at 3 minutes Pulse- Standing at 3 minutes  03/07/17 1421 143/77 97 141/79 98 136/81 101     Constitutional:  VS see above. General Appearance: alert, well-developed, well-nourished, NAD  Eyes: Normal lids and conjunctive, non-icteric sclera  Ears, Nose, Mouth, Throat: MMM, Normal external inspection ears/nares/mouth/lips/gums.  Neck: No masses, trachea midline.   Respiratory: Normal respiratory effort. no wheeze, no rhonchi, no rales  Cardiovascular: S1/S2 normal, no murmur, no rub/gallop auscultated. RRR.   Musculoskeletal: Gait normal. Symmetric and independent movement of all extremities  Neurological: Normal balance/coordination. No tremor.  Skin: warm, dry, intact.   Psychiatric: Normal judgment/insight. Normal mood and affect. Oriented x3.       ASSESSMENT/PLAN:   Essential hypertension - Plan: COMPLETE METABOLIC PANEL WITH GFR, TSH, CBC, Urinalysis, Routine w reflex microscopic  Need for immunization against influenza - Plan: Flu vaccine HIGH DOSE PF  Dizziness - postural, sounds orthostatic - no alarm signs, w/u last year no major concerns. Hold chlorthalidone and see how this does.  - Plan: COMPLETE METABOLIC PANEL WITH GFR, TSH, CBC, Urinalysis, Routine w reflex microscopic  CKD (chronic kidney disease) stage 4, GFR 15-29 ml/min (HCC) - Plan: COMPLETE METABOLIC PANEL WITH GFR, Ambulatory referral to Nephrology  Hypothyroidism, unspecified type - Plan: TSH    Patient Instructions  Plan: HOLD chlorthalidone blood pressure medicine See nurse in 2 weeks to recheck blood pressure I'll order referral to different kidney specialist in [place]  Labs today to monitor kidneys       Follow-up plan: Return in about 2 weeks (around 03/21/2017) for nurse visit - blood pressure .  Visit summary with medication list and pertinent instructions was printed for patient to review, alert [place] if any changes needed. All questions at time of visit were answered - patient instructed to contact office with any additional concerns. ER/RTC precautions were reviewed with the patient and  understanding verbalized.   Note: Total time spent 25 minutes, greater than 50% of the visit was spent face-to-face counseling and coordinating care for the following: The primary encounter diagnosis was Essential hypertension. Diagnoses of Need for immunization against influenza, Dizziness, CKD (chronic kidney disease) stage 4, GFR 15-29 ml/min (HCC), Hypothyroidism, unspecified type, and Chronic low back pain, unspecified back pain laterality, with sciatica presence unspecified were also pertinent to this visit.[name]

## 2017-03-07 NOTE — Patient Instructions (Signed)
Plan: HOLD chlorthalidone blood pressure medicine See nurse in 2 weeks to recheck blood pressure I'll order referral to different kidney specialist in [place]  Labs today to monitor kidneys

## 2017-03-08 LAB — URINALYSIS, ROUTINE W REFLEX MICROSCOPIC
BACTERIA UA: NONE SEEN /HPF
Bilirubin Urine: NEGATIVE
Glucose, UA: NEGATIVE
HGB URINE DIPSTICK: NEGATIVE
HYALINE CAST: NONE SEEN /LPF
Ketones, ur: NEGATIVE
Nitrite: NEGATIVE
Protein, ur: NEGATIVE
SPECIFIC GRAVITY, URINE: 1.018 (ref 1.001–1.03)

## 2017-03-08 LAB — CBC
HCT: 37.2 % (ref 35.0–45.0)
HEMOGLOBIN: 12.8 g/dL (ref 11.7–15.5)
MCH: 31.4 pg (ref 27.0–33.0)
MCHC: 34.4 g/dL (ref 32.0–36.0)
MCV: 91.4 fL (ref 80.0–100.0)
MPV: 9.9 fL (ref 7.5–12.5)
PLATELETS: 311 10*3/uL (ref 140–400)
RBC: 4.07 10*6/uL (ref 3.80–5.10)
RDW: 12.2 % (ref 11.0–15.0)
WBC: 8 10*3/uL (ref 3.8–10.8)

## 2017-03-08 LAB — COMPLETE METABOLIC PANEL WITH GFR
AG Ratio: 1.7 (calc) (ref 1.0–2.5)
ALBUMIN MSPROF: 4.3 g/dL (ref 3.6–5.1)
ALKALINE PHOSPHATASE (APISO): 67 U/L (ref 33–130)
ALT: 15 U/L (ref 6–29)
AST: 25 U/L (ref 10–35)
BILIRUBIN TOTAL: 0.4 mg/dL (ref 0.2–1.2)
BUN / CREAT RATIO: 20 (calc) (ref 6–22)
BUN: 38 mg/dL — ABNORMAL HIGH (ref 7–25)
CHLORIDE: 103 mmol/L (ref 98–110)
CO2: 31 mmol/L (ref 20–32)
Calcium: 9.8 mg/dL (ref 8.6–10.4)
Creat: 1.87 mg/dL — ABNORMAL HIGH (ref 0.60–0.88)
GFR, Est African American: 29 mL/min/{1.73_m2} — ABNORMAL LOW (ref >=60)
GFR, Est Non African American: 25 mL/min/{1.73_m2} — ABNORMAL LOW (ref >=60)
GLOBULIN: 2.6 g/dL (ref 1.9–3.7)
GLUCOSE: 87 mg/dL (ref 65–99)
Potassium: 4 mmol/L (ref 3.5–5.3)
SODIUM: 142 mmol/L (ref 135–146)
Total Protein: 6.9 g/dL (ref 6.1–8.1)

## 2017-03-08 LAB — TEST AUTHORIZATION

## 2017-03-08 LAB — T4, FREE: Free T4: 1.4 ng/dL (ref 0.8–1.8)

## 2017-03-08 LAB — TSH: TSH: 5.49 m[IU]/L — AB (ref 0.40–4.50)

## 2017-03-08 NOTE — Addendum Note (Signed)
Addended by: Maryla Morrow on: 03/08/2017 08:17 AM   Modules accepted: Orders

## 2017-03-11 ENCOUNTER — Encounter: Payer: Self-pay | Admitting: Podiatry

## 2017-03-11 ENCOUNTER — Ambulatory Visit (INDEPENDENT_AMBULATORY_CARE_PROVIDER_SITE_OTHER): Payer: Medicare PPO | Admitting: Podiatry

## 2017-03-11 VITALS — BP 167/85 | HR 83 | Ht 64.0 in | Wt 140.0 lb

## 2017-03-11 DIAGNOSIS — G5792 Unspecified mononeuropathy of left lower limb: Secondary | ICD-10-CM | POA: Diagnosis not present

## 2017-03-11 DIAGNOSIS — M79672 Pain in left foot: Secondary | ICD-10-CM

## 2017-03-11 DIAGNOSIS — M792 Neuralgia and neuritis, unspecified: Secondary | ICD-10-CM

## 2017-03-11 NOTE — Progress Notes (Signed)
SUBJECTIVE: 83 y.o. year old female presents complaining of left foot and leg more constant daily pain 2 years since early 20016. Right side problem is just varicose veins. Left leg pain gets worse as day progress. Uses cold gel pack at home. Starts out as a cramp and becomes throbbing like pain. Has had injection on spine  weeks ago. It helped with back pain but not the foot and leg pain. Scheduled to see vascular specialist for varicose vain next month. Patient is referred by Dr. Sheppard Coil.   Diagnosed with Sciatica on left lower hip area in August 2018. Review of Systems  Constitutional: Negative.   Eyes: Negative.   Respiratory: Negative.   Cardiovascular: Negative.   Gastrointestinal: Negative.   Genitourinary: Negative.   Musculoskeletal: Positive for back pain and joint pain.  Skin: Negative.   Neurological: Negative.    OBJECTIVE: DERMATOLOGIC EXAMINATION: No abnormal findings.  VASCULAR EXAMINATION OF LOWER LIMBS: Dorsalis pedis arteries are faintly palpable on both feet. Posterior tibial arteries are not palpable.  No edema or erythema noted. Capillary Filling times within 3 seconds in all digits.  Temperature gradient from tibial crest to dorsum of foot is within normal bilateral.  NEUROLOGIC EXAMINATION OF THE LOWER LIMBS: Pain anterior ankle radiates to lateral aspect of the leg.  Achilles DTR is present and within normal. Monofilament (Semmes-Weinstein 10-gm) sensory testing positive 6 out of 6, bilateral. Vibratory sensations(128Hz  turning fork) intact at medial and lateral forefoot bilateral.  Sharp and Dull discriminatory sensations at the plantar ball of hallux is intact bilateral.   MUSCULOSKELETAL EXAMINATION: No gross deformities.  ASSESSMENT: Neuralgia, superficial peroneal nerve left lower limb. Pain anterior ankle left.  PLAN: Reviewed findings and available treatment options such as topical compound cream. As per request, topical compound cream  placed.

## 2017-03-11 NOTE — Patient Instructions (Addendum)
Seen for pain in left foot and leg. No abnormal findings in circulation or nerves in foot. Possible due to Sciatica. Will try compound cream. Will place order to the company. Wait for their call.

## 2017-03-21 ENCOUNTER — Ambulatory Visit (INDEPENDENT_AMBULATORY_CARE_PROVIDER_SITE_OTHER): Payer: Medicare PPO | Admitting: Osteopathic Medicine

## 2017-03-21 DIAGNOSIS — I10 Essential (primary) hypertension: Secondary | ICD-10-CM | POA: Diagnosis not present

## 2017-03-21 NOTE — Progress Notes (Signed)
Home cuff is accurate: goal BP less than 140/90  Continue current medications - continue to hold chlorthalidone, and plan to follow up with me in 3-4 months for BP recheck as long as feeling ok

## 2017-03-21 NOTE — Progress Notes (Signed)
Laura Ellis presents to clinic for a bp check.  Denies dizziness, headache, blurred vision.  She brought in her home bp cuff.  Home cuff 126/60 and office cuff read 122/69.  Will route to PCP for further review.  Pt advised that she will be called with any recommendations. -EH/RMA

## 2017-03-22 NOTE — Progress Notes (Signed)
Recommendations left on vm -EH/RMA  

## 2017-03-25 ENCOUNTER — Other Ambulatory Visit: Payer: Self-pay | Admitting: Osteopathic Medicine

## 2017-04-09 ENCOUNTER — Ambulatory Visit: Payer: Medicare PPO | Admitting: Vascular Surgery

## 2017-04-09 ENCOUNTER — Encounter: Payer: Self-pay | Admitting: Vascular Surgery

## 2017-04-09 VITALS — BP 156/76 | HR 91 | Temp 96.7°F | Resp 16 | Ht 64.0 in | Wt 142.0 lb

## 2017-04-09 DIAGNOSIS — I83893 Varicose veins of bilateral lower extremities with other complications: Secondary | ICD-10-CM

## 2017-04-09 NOTE — Progress Notes (Signed)
[facility] [place]  Patient name: Laura Ellis MRN: [id number] DOB: 04/16/1933 Sex: female  REASON FOR VISIT: Follow-up right leg venous varicosities  HPI: Laura Ellis is a 83 y.o. female here today for follow-up.  She underwent successful ablation of her right anterior great saphenous vein on 12/27/2016.  She continues to have distention of tributary varicosities that resulted due to this refluxing saphenous vein.  These are over her anterior thigh and large veins extending across her knee down onto her calf.  She reports discomfort over these despite use of compression garments.  Fortunately she has had no bruising and no DVT  Past Medical History:  Diagnosis Date  . Arthritis   . Chronic low back pain 05/26/2015  . CKD (chronic kidney disease) stage 4, GFR 15-29 ml/min (HCC) 02/23/2016  . Essential hypertension 05/26/2015  . Hypertension   . Hypothyroidism 07/05/2015  . Osteopenia   . Osteoporosis 05/26/2015   old Dexa reviewed, pt states doesn't want to be on the bisphosphonate therapy but is taking Ca and D     History reviewed. No pertinent family history.  SOCIAL HISTORY: Social History   Tobacco Use  . Smoking status: Former Research scientist (life sciences)  . Smokeless tobacco: Former Systems developer    Quit date: 12/03/2001  Substance Use Topics  . Alcohol use: Yes    Comment: occ    Allergies  Allergen Reactions  . Ace Inhibitors     Cough  . Alendronate Sodium     esophagitis  . Ezetimibe     (Zetia)  . Fenofibrate     arthralgia  . Rosuvastatin Calcium   . Statins     Muscle aches    Current Outpatient Medications  Medication Sig Dispense Refill  . amLODipine (NORVASC) 5 MG tablet TAKE ONE TABLET BY MOUTH EVERY DAY 30 tablet 2  . Ascorbic Acid (VITAMIN C) 1000 MG tablet Take 1,000 mg by mouth daily.    . Coenzyme Q10 (COQ10) 100 MG CAPS Take by mouth daily.    . Cyanocobalamin (VITAMIN B-12 PO) Take 1 tablet by mouth  daily.    [name] escitalopram (LEXAPRO) 5 MG tablet Take 1 tablet (5 mg total) by mouth at bedtime. 90 tablet 1  . latanoprost (XALATAN) 0.005 % ophthalmic solution     . Multiple Vitamin (MULTIVITAMIN WITH MINERALS) TABS tablet Take 1 tablet by mouth daily.    [name] NAPROXEN PO Take 1 tablet by mouth 2 (two) times daily.    . Omega-3 Fatty Acids (FISH OIL PO) Take 1 capsule by mouth daily.    [name] OVER THE COUNTER MEDICATION Take 1 tablet by mouth daily.    . pneumococcal 13-valent conjugate vaccine (PREVNAR 13) SUSP injection     . Red Yeast Rice 600 MG CAPS Take 600 mg by mouth.    . Urea 40 % GEL Apply to abnormal nail/skin as directed 25 mL 1   No current facility-administered medications for this visit.     REVIEW OF SYSTEMS:  [X]  denotes positive finding, [ ]  denotes negative finding Cardiac  Comments:  Chest pain or chest pressure:    Shortness of breath upon exertion:    Short of breath when lying flat:    Irregular heart rhythm:        Vascular    Pain in calf, thigh, or hip brought on by ambulation:    Pain in feet at night that wakes you up from your sleep:  Blood clot in your veins:    Leg swelling:  x         PHYSICAL EXAM: Vitals:   04/09/17 1441 04/09/17 1442  BP: (!) 157/78 (!) 156/76  Pulse: 91   Resp: 16   Temp: (!) 96.7 F (35.9 C)   SpO2: 98%   Weight: 142 lb (64.4 kg)   Height: 5\' 4"  (1.626 m)     GENERAL: The patient is a well-nourished female, in no acute distress. The vital signs are documented above. CARDIOVASCULAR: Large varicosities over the thigh and knee on the right PULMONARY: There is good air exchange  MUSCULOSKELETAL: There are no major deformities or cyanosis. NEUROLOGIC: No focal weakness or paresthesias are detected. SKIN: There are no ulcers or rashes noted.  Telangiectasias but no venous ulcers PSYCHIATRIC: The patient has a normal affect.  DATA:  None new  MEDICAL ISSUES: Patient continues to have discomfort over her tributary  varicosities.  Have recommended stab phlebectomy of these tributary varicosities for symptom relief.  Understands this is under local anesthesia in our office with a greater than 20 stab phlebectomy sites.  She wishes to proceed as soon as possible    Rosetta Posner, MD [facility] Tel [phone] Pager [phone]

## 2017-04-16 ENCOUNTER — Other Ambulatory Visit: Payer: Self-pay | Admitting: Osteopathic Medicine

## 2017-04-16 DIAGNOSIS — I10 Essential (primary) hypertension: Secondary | ICD-10-CM

## 2017-05-15 DIAGNOSIS — M5136 Other intervertebral disc degeneration, lumbar region: Secondary | ICD-10-CM | POA: Diagnosis not present

## 2017-05-15 DIAGNOSIS — M792 Neuralgia and neuritis, unspecified: Secondary | ICD-10-CM | POA: Insufficient documentation

## 2017-05-15 DIAGNOSIS — M4310 Spondylolisthesis, site unspecified: Secondary | ICD-10-CM | POA: Insufficient documentation

## 2017-05-15 DIAGNOSIS — M4150 Other secondary scoliosis, site unspecified: Secondary | ICD-10-CM | POA: Insufficient documentation

## 2017-05-25 DIAGNOSIS — M792 Neuralgia and neuritis, unspecified: Secondary | ICD-10-CM | POA: Diagnosis not present

## 2017-05-25 DIAGNOSIS — M545 Low back pain: Secondary | ICD-10-CM | POA: Diagnosis not present

## 2017-05-25 DIAGNOSIS — G894 Chronic pain syndrome: Secondary | ICD-10-CM | POA: Insufficient documentation

## 2017-05-25 DIAGNOSIS — M4316 Spondylolisthesis, lumbar region: Secondary | ICD-10-CM | POA: Diagnosis not present

## 2017-05-25 DIAGNOSIS — M4186 Other forms of scoliosis, lumbar region: Secondary | ICD-10-CM | POA: Diagnosis not present

## 2017-06-03 DIAGNOSIS — M545 Low back pain: Secondary | ICD-10-CM | POA: Diagnosis not present

## 2017-06-06 DIAGNOSIS — N184 Chronic kidney disease, stage 4 (severe): Secondary | ICD-10-CM | POA: Diagnosis not present

## 2017-06-06 DIAGNOSIS — N2581 Secondary hyperparathyroidism of renal origin: Secondary | ICD-10-CM | POA: Diagnosis not present

## 2017-06-06 DIAGNOSIS — G8929 Other chronic pain: Secondary | ICD-10-CM | POA: Diagnosis not present

## 2017-06-06 DIAGNOSIS — D649 Anemia, unspecified: Secondary | ICD-10-CM | POA: Diagnosis not present

## 2017-06-06 DIAGNOSIS — M545 Low back pain: Secondary | ICD-10-CM | POA: Diagnosis not present

## 2017-06-10 LAB — HEPATIC FUNCTION PANEL
ALK PHOS: 71 (ref 25–125)
ALT: 20 (ref 7–35)
AST: 27 (ref 13–35)

## 2017-06-10 LAB — LIPID PANEL
Cholesterol: 230 — AB (ref 0–200)
HDL: 74 — AB (ref 35–70)
LDL CALC: 135
TRIGLYCERIDES: 106 (ref 40–160)

## 2017-06-10 LAB — CBC AND DIFFERENTIAL
HCT: 39 (ref 36–46)
Hemoglobin: 13.2 (ref 12.0–16.0)
WBC: 7.4

## 2017-06-10 LAB — BASIC METABOLIC PANEL
BUN: 33 — AB (ref 4–21)
CREATININE: 1.8 — AB (ref 0.5–1.1)
GLUCOSE: 81
POTASSIUM: 4.3 (ref 3.4–5.3)
Sodium: 143 (ref 137–147)

## 2017-06-11 ENCOUNTER — Other Ambulatory Visit: Payer: Self-pay | Admitting: Nephrology

## 2017-06-11 DIAGNOSIS — N184 Chronic kidney disease, stage 4 (severe): Secondary | ICD-10-CM

## 2017-06-11 DIAGNOSIS — D509 Iron deficiency anemia, unspecified: Secondary | ICD-10-CM

## 2017-06-11 DIAGNOSIS — N2581 Secondary hyperparathyroidism of renal origin: Secondary | ICD-10-CM

## 2017-06-13 ENCOUNTER — Ambulatory Visit
Admission: RE | Admit: 2017-06-13 | Discharge: 2017-06-13 | Disposition: A | Discharge status: Home / Self Care | Payer: Medicare PPO | Source: Ambulatory Visit | Attending: Nephrology | Admitting: Nephrology

## 2017-06-13 DIAGNOSIS — D509 Iron deficiency anemia, unspecified: Secondary | ICD-10-CM

## 2017-06-13 DIAGNOSIS — N184 Chronic kidney disease, stage 4 (severe): Secondary | ICD-10-CM

## 2017-06-13 DIAGNOSIS — N2581 Secondary hyperparathyroidism of renal origin: Secondary | ICD-10-CM

## 2017-06-13 DIAGNOSIS — N189 Chronic kidney disease, unspecified: Secondary | ICD-10-CM | POA: Diagnosis not present

## 2017-06-27 ENCOUNTER — Other Ambulatory Visit: Payer: Self-pay | Admitting: *Deleted

## 2017-06-27 ENCOUNTER — Other Ambulatory Visit: Payer: Self-pay

## 2017-06-27 ENCOUNTER — Encounter: Payer: Self-pay | Admitting: Vascular Surgery

## 2017-06-27 ENCOUNTER — Ambulatory Visit: Payer: Medicare PPO | Admitting: Vascular Surgery

## 2017-06-27 VITALS — BP 138/76 | HR 84 | Temp 97.3°F | Resp 16 | Ht 61.0 in | Wt 141.0 lb

## 2017-06-27 DIAGNOSIS — I83893 Varicose veins of bilateral lower extremities with other complications: Secondary | ICD-10-CM

## 2017-06-27 DIAGNOSIS — I83812 Varicose veins of left lower extremities with pain: Secondary | ICD-10-CM

## 2017-06-27 NOTE — Progress Notes (Signed)
    Stab Phlebectomy Procedure  Tanith Dagostino Geiman DOB:07/26/1932  06/27/2017  Consent signed: Yes  Surgeon:T.F. Harol Shabazz, MD  Procedure: stab phlebectomy: right leg  BP 138/76 (BP Location: Left Arm, Patient Position: Sitting, Cuff Size: Normal)   Pulse 84   Temp (!) 97.3 F (36.3 C) (Oral)   Resp 16   Ht 5\' 1"  (1.549 m)   Wt 141 lb (64 kg)   SpO2 98%   BMI 26.64 kg/m   Start time: 10:15   End time: 11:35   Tumescent Anesthesia: 400 cc 0.9% NaCl with 50 cc Lidocaine HCL with 1% Epi and 15 cc 8.4% NaHCO3  Local Anesthesia: 5 cc Lidocaine HCL and NaHCO3 (ratio 2:1)  Sclerotherapy: .3 %Sotradecol. Patient received a total of 2 cc  Stab Phlebectomy: >20 Sites: Thigh and Calf  Patient tolerated procedure well: Yes  Notes:   Description of Procedure:  After marking the course of the secondary varicosities, the patient was placed on the operating table in the supine position, and the right leg was prepped and draped in sterile fashion.    The patient was then put into Trendelenburg position.  Local anesthetic was administered at the previously marked varicosities, and tumescent anesthesia was administered around the vessels.  Greater than 20 stab wounds were made using the tip of an 11 blade. And using the vein hook, the phlebectomies were performed using a hemostat to avulse the varicosities.  Adequate hemostasis was achieved, and steri strips were applied to the stab wound.    Sclerotherapy was performed to 3 varicosities using 2  cc .3% Sotradecol foam via a 27g butterfly needle.  ABD pads and thigh high compression stockings were applied as well ace wraps where needed. Blood loss was less than 15 cc.  The patient ambulated out of the operating room having tolerated the procedure well.

## 2017-07-04 ENCOUNTER — Other Ambulatory Visit: Payer: Self-pay | Admitting: Osteopathic Medicine

## 2017-07-08 ENCOUNTER — Encounter: Payer: Self-pay | Admitting: Osteopathic Medicine

## 2017-07-08 LAB — ESTIMATED GFR
CALCIUM: 9.9
Carbon Dioxide, Total: 24
Chloride: 101
GFR CALC NON AF AMER: 25
TOTAL PROTEIN: 7.4 g/dL

## 2017-07-10 DIAGNOSIS — M48061 Spinal stenosis, lumbar region without neurogenic claudication: Secondary | ICD-10-CM | POA: Diagnosis not present

## 2017-07-10 DIAGNOSIS — M545 Low back pain: Secondary | ICD-10-CM | POA: Diagnosis not present

## 2017-07-18 ENCOUNTER — Encounter: Payer: Self-pay | Admitting: Osteopathic Medicine

## 2017-07-18 ENCOUNTER — Ambulatory Visit (INDEPENDENT_AMBULATORY_CARE_PROVIDER_SITE_OTHER): Payer: Medicare HMO | Admitting: Osteopathic Medicine

## 2017-07-18 VITALS — BP 140/65 | HR 87 | Temp 97.7°F | Wt 143.1 lb

## 2017-07-18 DIAGNOSIS — I10 Essential (primary) hypertension: Secondary | ICD-10-CM | POA: Diagnosis not present

## 2017-07-18 DIAGNOSIS — N281 Cyst of kidney, acquired: Secondary | ICD-10-CM | POA: Diagnosis not present

## 2017-07-18 NOTE — Progress Notes (Signed)
HPI: Laura Ellis is a 84 y.o. female  who presents to [place] today, 07/18/17,  for chief complaint of:  F/u high blood pressure   HTN: Well-controlled on current medications. Hasn't been checking blood pressures is much at home, she has been dizzy preparing her house to sell, she is planning to move to [place] in the next few months.     Past medical history, surgical history, social history and family history reviewed.  Patient Active Problem List   Diagnosis Date Noted  . Dizziness 03/07/2017  . CKD (chronic kidney disease) stage 4, GFR 15-29 ml/min (HCC) 02/23/2016  . Trochanteric bursitis of left hip 01/17/2016  . Left ankle pain 01/17/2016  . Hypothyroidism 07/05/2015  . Osteoporosis 05/26/2015  . Cataract 05/26/2015  . Bilateral knee pain 05/26/2015  . Chronic low back pain 05/26/2015  . Essential hypertension 05/26/2015    Current medication list and allergy/intolerance information reviewed.   Current Outpatient Medications on File Prior to Visit  Medication Sig Dispense Refill  . amLODipine (NORVASC) 5 MG tablet TAKE ONE TABLET BY MOUTH EVERY DAY 30 tablet 2  . Ascorbic Acid (VITAMIN C) 1000 MG tablet Take 1,000 mg by mouth daily.    . chlorthalidone (HYGROTON) 25 MG tablet TAKE ONE TABLET BY MOUTH EVERY DAY 90 tablet 1  . Coenzyme Q10 (COQ10) 100 MG CAPS Take by mouth daily.    . Cyanocobalamin (VITAMIN B-12 PO) Take 1 tablet by mouth daily.    [name] escitalopram (LEXAPRO) 5 MG tablet Take 1 tablet (5 mg total) by mouth at bedtime. 90 tablet 1  . latanoprost (XALATAN) 0.005 % ophthalmic solution     . Multiple Vitamin (MULTIVITAMIN WITH MINERALS) TABS tablet Take 1 tablet by mouth daily.    [name] NAPROXEN PO Take 1 tablet by mouth 2 (two) times daily.    . Omega-3 Fatty Acids (FISH OIL PO) Take 1 capsule by mouth daily.    [name] OVER THE COUNTER MEDICATION Take 1 tablet by mouth daily.    . pneumococcal 13-valent conjugate vaccine  (PREVNAR 13) SUSP injection     . Red Yeast Rice 600 MG CAPS Take 600 mg by mouth.    . Urea 40 % GEL Apply to abnormal nail/skin as directed 25 mL 1   No current facility-administered medications on file prior to visit.    Allergies  Allergen Reactions  . Statins Other (See Comments)    Muscle aches  . Ace Inhibitors     Cough  . Alendronate Sodium     esophagitis  . Ezetimibe     (Zetia)  . Fenofibrate     arthralgia  . Other   . Rosuvastatin Calcium   . Rosuvastatin Calcium       Review of Systems:  Constitutional: No recent illness  HEENT: No  headache, no vision change  Cardiac: No  chest pain, No  pressure, No palpitations  Respiratory:  No  shortness of breath. No  Cough  Musculoskeletal: No new myalgia/arthralgia  Psychiatric: No  concerns with depression, No  concerns with anxiety  Exam:  BP 140/65   Pulse 87   Temp 97.7 F (36.5 C) (Oral)   Wt 143 lb 1.9 oz (64.9 kg)   BMI 27.04 kg/m   No data found.   Constitutional: VS see above. General Appearance: alert, well-developed, well-nourished, NAD  Eyes: Normal lids and conjunctive, non-icteric sclera  Ears, Nose, Mouth, Throat: MMM, Normal external inspection ears/nares/mouth/lips/gums.  Neck:  No masses, trachea midline.   Respiratory: Normal respiratory effort. no wheeze, no rhonchi, no rales  Cardiovascular: S1/S2 normal, no murmur, no rub/gallop auscultated. RRR.   Musculoskeletal: Gait normal. Symmetric and independent movement of all extremities  Neurological: Normal balance/coordination. No tremor.  Skin: warm, dry, intact.   Psychiatric: Normal judgment/insight. Normal mood and affect. Oriented x3.       ASSESSMENT/PLAN: Blood pressure stable, no change to medications.  Essential hypertension  Renal cyst, left    Follow-up plan: Return in about 6 months (around 01/18/2018) for MEDICARE WELLNESS VISIT, SOONER IF NEEDED . Patient states she will schedule a visit with me prior  to moving [place] if needed to get all medical records in order and make sure she has enough medications.  Visit summary with medication list and pertinent instructions was printed for patient to review, alert [place] if any changes needed. All questions at time of visit were answered - patient instructed to contact office with any additional concerns. ER/RTC precautions were reviewed with the patient and understanding verbalized.

## 2017-07-24 ENCOUNTER — Ambulatory Visit (HOSPITAL_COMMUNITY)
Admission: RE | Admit: 2017-07-24 | Discharge: 2017-07-24 | Disposition: A | Discharge status: Home / Self Care | Payer: Medicare HMO | Source: Ambulatory Visit | Attending: Vascular Surgery | Admitting: Vascular Surgery

## 2017-07-24 DIAGNOSIS — I83812 Varicose veins of left lower extremities with pain: Secondary | ICD-10-CM | POA: Diagnosis not present

## 2017-07-30 ENCOUNTER — Ambulatory Visit: Payer: Medicare HMO | Admitting: Vascular Surgery

## 2017-07-30 ENCOUNTER — Encounter: Payer: Self-pay | Admitting: Vascular Surgery

## 2017-07-30 VITALS — BP 146/69 | HR 109 | Temp 98.0°F | Resp 16 | Ht 61.5 in | Wt 141.0 lb

## 2017-07-30 DIAGNOSIS — I83893 Varicose veins of bilateral lower extremities with other complications: Secondary | ICD-10-CM

## 2017-07-30 NOTE — Progress Notes (Signed)
   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 female here for follow-up.  Had multiple stab phlebectomies.  Is done well with this.  Has continued to have some discomfort in her left leg as well  Current Outpatient Medications  Medication Sig Dispense Refill  . amLODipine (NORVASC) 5 MG tablet TAKE ONE TABLET BY MOUTH EVERY DAY 30 tablet 2  . Ascorbic Acid (VITAMIN C) 1000 MG tablet Take 1,000 mg by mouth daily.    . chlorthalidone (HYGROTON) 25 MG tablet TAKE ONE TABLET BY MOUTH EVERY DAY 90 tablet 1  . Coenzyme Q10 (COQ10) 100 MG CAPS Take by mouth daily.    . Cyanocobalamin (VITAMIN B-12 PO) Take 1 tablet by mouth daily.    [name] latanoprost (XALATAN) 0.005 % ophthalmic solution     . Multiple Vitamin (MULTIVITAMIN WITH MINERALS) TABS tablet Take 1 tablet by mouth daily.    [name] NAPROXEN PO Take 1 tablet by mouth 2 (two) times daily.    . Red Yeast Rice 600 MG CAPS Take 600 mg by mouth.    . Urea 40 % GEL Apply to abnormal nail/skin as directed 25 mL 1  . escitalopram (LEXAPRO) 5 MG tablet Take 1 tablet (5 mg total) by mouth at bedtime. (Patient not taking: Reported on 07/30/2017) 90 tablet 1  . Omega-3 Fatty Acids (FISH OIL PO) Take 1 capsule by mouth daily.    [name] OVER THE COUNTER MEDICATION Take 1 tablet by mouth daily.    . pneumococcal 13-valent conjugate vaccine (PREVNAR 13) SUSP injection      No current facility-administered medications for this visit.      PHYSICAL EXAM: Vitals:   07/30/17 1510 07/30/17 1511  BP: (!) 151/86 (!) 146/69  Pulse: (!) 109   Resp: 16   Temp: 98 F (36.7 C)   SpO2: 97%   Weight: 141 lb (64 kg)   Height: 5' 1.5" (1.562 m)     GENERAL: The patient is a well-nourished female, in no acute distress. The vital signs are documented above. Right leg stabs are healing  nicely with no evidence of infection.  Left leg is noted for marked varicosities over her anterior thigh around her knee region and calf.  I imaged her left leg with SonoSite ultrasound this coincides with her formal duplex showing a anterior accessory branch arising at the saphenofemoral junction giving rise to these large varicosities.  She does not have any long segment of anterior accessory branch supplying this.  MEDICAL ISSUES: Stable following treatment of her right leg.  She does have the discomfort in her left leg despite thigh-high compression garment use and elevation when possible.  She would be a candidate for multiple stab phlebectomies throughout her left leg.  Does not have any role for ablation of great saphenous vein or anterior accessory branch.  She does wish to improve.  We will proceed with this when she has had approval from insurance standpoint for stab phlebectomy   Rosetta Posner, MD [facility] Tel [phone] Pager [phone]

## 2017-07-31 ENCOUNTER — Ambulatory Visit: Payer: Medicare HMO | Admitting: *Deleted

## 2017-08-06 ENCOUNTER — Encounter: Payer: Self-pay | Admitting: Vascular Surgery

## 2017-08-13 DIAGNOSIS — M545 Low back pain: Secondary | ICD-10-CM | POA: Diagnosis not present

## 2017-08-13 DIAGNOSIS — M48061 Spinal stenosis, lumbar region without neurogenic claudication: Secondary | ICD-10-CM | POA: Diagnosis not present

## 2017-08-13 DIAGNOSIS — M792 Neuralgia and neuritis, unspecified: Secondary | ICD-10-CM | POA: Diagnosis not present

## 2017-08-19 ENCOUNTER — Encounter: Payer: Self-pay | Admitting: Osteopathic Medicine

## 2017-08-19 ENCOUNTER — Ambulatory Visit (INDEPENDENT_AMBULATORY_CARE_PROVIDER_SITE_OTHER): Payer: Medicare HMO | Admitting: Osteopathic Medicine

## 2017-08-19 VITALS — BP 151/57 | HR 85 | Temp 98.1°F | Wt 142.0 lb

## 2017-08-19 DIAGNOSIS — S81802A Unspecified open wound, left lower leg, initial encounter: Secondary | ICD-10-CM | POA: Diagnosis not present

## 2017-08-19 MED ORDER — DOXYCYCLINE HYCLATE 100 MG PO TABS: 100.0000 mg | ORAL_TABLET | Freq: Two times a day (BID) | ORAL | 0 refills | Status: DC

## 2017-08-19 NOTE — Progress Notes (Signed)
HPI: Laura Ellis is a 84 y.o. female who  has a past medical history of Arthritis, Chronic low back pain (05/26/2015), CKD (chronic kidney disease) stage 4, GFR 15-29 ml/min (HCC) (02/23/2016), Essential hypertension (05/26/2015), Hypertension, Hypothyroidism (07/05/2015), Osteopenia, and Osteoporosis (05/26/2015).  she presents to [facility] today, 08/19/17,  for chief complaint of:  Leg spots  L leg: ulceration, warm, red. She states it has persisted despite neosporin use. Not sure if due to something her cats might have exposed her to. She also was using polysporin and soaking the leg in brown Listerine, scrubbing with Hibiclens.   R leg: rash/ulceration present also. Not as bad.     See photos below     Past medical history, surgical history, social history and family history reviewed. No updates needed.   Current medication list and allergy/intolerance information reviewed.    Current Outpatient Medications on File Prior to Visit  Medication Sig Dispense Refill  . amLODipine (NORVASC) 5 MG tablet TAKE ONE TABLET BY MOUTH EVERY DAY 30 tablet 2  . Ascorbic Acid (VITAMIN C) 1000 MG tablet Take 1,000 mg by mouth daily.    . chlorthalidone (HYGROTON) 25 MG tablet TAKE ONE TABLET BY MOUTH EVERY DAY 90 tablet 1  . Coenzyme Q10 (COQ10) 100 MG CAPS Take by mouth daily.    . Cyanocobalamin (VITAMIN B-12 PO) Take 1 tablet by mouth daily.    [name] escitalopram (LEXAPRO) 5 MG tablet Take 1 tablet (5 mg total) by mouth at bedtime. (Patient not taking: Reported on 07/30/2017) 90 tablet 1  . latanoprost (XALATAN) 0.005 % ophthalmic solution     . Multiple Vitamin (MULTIVITAMIN WITH MINERALS) TABS tablet Take 1 tablet by mouth daily.    [name] NAPROXEN PO Take 1 tablet by mouth 2 (two) times daily.    . Omega-3 Fatty Acids (FISH OIL PO) Take 1 capsule by mouth daily.    [name] OVER THE COUNTER MEDICATION Take 1 tablet by mouth daily.    . pneumococcal 13-valent conjugate  vaccine (PREVNAR 13) SUSP injection     . Red Yeast Rice 600 MG CAPS Take 600 mg by mouth.    . Urea 40 % GEL Apply to abnormal nail/skin as directed 25 mL 1   No current facility-administered medications on file prior to visit.    Allergies  Allergen Reactions  . Statins Other (See Comments)    Muscle aches  . Ace Inhibitors     Cough  . Alendronate Sodium     esophagitis  . Ezetimibe     (Zetia)  . Fenofibrate     arthralgia  . Other   . Rosuvastatin Calcium   . Rosuvastatin Calcium       Review of Systems:  Constitutional: No recent illness, no fever   HEENT: No  headache, no vision change  Cardiac: No  chest pain, No  pressure, No palpitations  Respiratory:  No  shortness of breath.   Musculoskeletal: No new myalgia/arthralgia  Skin: +Rash  Neurologic: No  weakness, No  Dizziness   Exam:  BP (!) 151/57 (BP Location: Left Arm, Patient Position: Sitting, Cuff Size: Normal)   Pulse 85   Temp 98.1 F (36.7 C) (Oral)   Wt 142 lb 0.6 oz (64.4 kg)   BMI 26.40 kg/m   Constitutional: VS see above. General Appearance: alert, well-developed, well-nourished, NAD  Eyes: Normal lids and conjunctive, non-icteric sclera  Ears, Nose, Mouth, Throat: MMM, Normal external inspection ears/nares/mouth/lips/gums.  Neck:  No masses, trachea midline.   Respiratory: Normal respiratory effort.   Musculoskeletal: Gait normal. Symmetric and independent movement of all extremities  Neurological: Normal balance/coordination. No tremor.  Skin: warm, dr. No significant warmth to areas as below. Granulation tissue and scabs present. Se ephotos   Psychiatric: Normal judgment/insight. Normal mood and affect. Oriented x3.   R leg - lateral    L leg - anterior       ASSESSMENT/PLAN:   Wound of left lower extremity, initial encounter - nonstick pad and wrapped in coban to avoid irritation from adhesive. Doesn't really look actively infected, just slow to heal. Stop  irritating/caustic cleansing agents, just soap and water is fine. Nonstick gauze, vaseline okay, leave it open if at home, avoid contact there with the cats. Abx as needed but I think wound care will be the key here. I didn't see any value in culturing - no purulence.      Meds ordered this encounter  Medications  . doxycycline (VIBRA-TABS) 100 MG tablet    Sig: Take 1 tablet (100 mg total) by mouth 2 (two) times daily. Start pills and seek care if skin red, painful draining, spreading rash    Dispense:  14 tablet    Refill:  0     Follow-up plan: Return for recheck wound next week when back from [place].  Visit summary with medication list and pertinent instructions was printed for patient to review, alert [place] if any changes needed. All questions at time of visit were answered - patient instructed to contact office with any additional concerns. ER/RTC precautions were reviewed with the patient and understanding verbalized.     Please note: voice recognition software was used to produce this document, and typos may escape review. Please contact Dr. Sheppard Coil for any needed clarifications.

## 2017-08-22 ENCOUNTER — Other Ambulatory Visit: Payer: Medicare HMO | Admitting: Vascular Surgery

## 2017-09-02 ENCOUNTER — Encounter: Payer: Self-pay | Admitting: Osteopathic Medicine

## 2017-09-02 ENCOUNTER — Ambulatory Visit (INDEPENDENT_AMBULATORY_CARE_PROVIDER_SITE_OTHER): Payer: Medicare HMO | Admitting: Osteopathic Medicine

## 2017-09-02 VITALS — BP 146/84 | HR 81 | Temp 98.0°F | Wt 144.9 lb

## 2017-09-02 DIAGNOSIS — S81802D Unspecified open wound, left lower leg, subsequent encounter: Secondary | ICD-10-CM

## 2017-09-02 NOTE — Patient Instructions (Signed)
[facility] [address] [place], [place], [place] [address] Phone: [phone]

## 2017-09-02 NOTE — Progress Notes (Signed)
HPI: Laura Ellis is a 84 y.o. female who  has a past medical history of Arthritis, Chronic low back pain (05/26/2015), CKD (chronic kidney disease) stage 4, GFR 15-29 ml/min (HCC) (02/23/2016), Essential hypertension (05/26/2015), Hypertension, Hypothyroidism (07/05/2015), Osteopenia, and Osteoporosis (05/26/2015).  she presents to [facility] today, 09/02/17,  for chief complaint of:  Leg spots  L leg: ulceration. Seen 08/19/17 for this issue and R leg rash as well. She states then that it had persisted despite neosporin use. Not sure if due to something her cats might have exposed her to. She also was using polysporin and soaking the leg in brown Listerine, scrubbing with Hibiclens. R leg: rash/ulceration present also. Not as bad.  Today on follow-up, reports it's looking better, she is worried she's scratching at it in her sleep.       See photos below for comparison     Past medical history, surgical history, social history and family history reviewed. No updates needed.   Current medication list and allergy/intolerance information reviewed.    Current Outpatient Medications on File Prior to Visit  Medication Sig Dispense Refill  . amLODipine (NORVASC) 5 MG tablet TAKE ONE TABLET BY MOUTH EVERY DAY 30 tablet 2  . Ascorbic Acid (VITAMIN C) 1000 MG tablet Take 1,000 mg by mouth daily.    . chlorthalidone (HYGROTON) 25 MG tablet TAKE ONE TABLET BY MOUTH EVERY DAY 90 tablet 1  . Coenzyme Q10 (COQ10) 100 MG CAPS Take by mouth daily.    . Cyanocobalamin (VITAMIN B-12 PO) Take 1 tablet by mouth daily.    [name] doxycycline (VIBRA-TABS) 100 MG tablet Take 1 tablet (100 mg total) by mouth 2 (two) times daily. Start pills and seek care if skin red, painful draining, spreading rash 14 tablet 0  . escitalopram (LEXAPRO) 5 MG tablet Take 1 tablet (5 mg total) by mouth at bedtime. 90 tablet 1  . latanoprost (XALATAN) 0.005 % ophthalmic solution     . Multiple Vitamin  (MULTIVITAMIN WITH MINERALS) TABS tablet Take 1 tablet by mouth daily.    [name] NAPROXEN PO Take 1 tablet by mouth 2 (two) times daily.    . Omega-3 Fatty Acids (FISH OIL PO) Take 1 capsule by mouth daily.    [name] OVER THE COUNTER MEDICATION Take 1 tablet by mouth daily.    . pneumococcal 13-valent conjugate vaccine (PREVNAR 13) SUSP injection     . Red Yeast Rice 600 MG CAPS Take 600 mg by mouth.    . Urea 40 % GEL Apply to abnormal nail/skin as directed 25 mL 1   No current facility-administered medications on file prior to visit.    Allergies  Allergen Reactions  . Statins Other (See Comments)    Muscle aches  . Ace Inhibitors     Cough  . Alendronate Sodium     esophagitis  . Ezetimibe     (Zetia)  . Fenofibrate     arthralgia  . Other   . Rosuvastatin Calcium   . Rosuvastatin Calcium       Review of Systems:  Constitutional: No recent illness, no fever   HEENT: No  headache, no vision change  Cardiac: No  chest pain, No  pressure, No palpitations  Respiratory:  No  shortness of breath.   Musculoskeletal: No new myalgia/arthralgia  Skin: +Rash    Exam:  BP (!) 146/84 (BP Location: Right Arm, Patient Position: Sitting, Cuff Size: Normal)   Pulse 81  Temp 98 F (36.7 C) (Oral)   Wt 144 lb 14.4 oz (65.7 kg)   BMI 26.94 kg/m   Constitutional: VS see above. General Appearance: alert, well-developed, well-nourished, NAD  Eyes: Normal lids and conjunctive, non-icteric sclera  Ears, Nose, Mouth, Throat: MMM, Normal external inspection ears/nares/mouth/lips/gums.  Neck: No masses, trachea midline.   Respiratory: Normal respiratory effort.   Musculoskeletal: Gait normal. Symmetric and independent movement of all extremities  Neurological: Normal balance/coordination. No tremor.  Skin: warm, dr. No significant warmth to areas as below. Granulation tissue and some improved healing is present. See photos   Psychiatric: Normal judgment/insight. Normal mood and  affect. Oriented x3.    08/19/17 photos: R leg - lateral   L leg - anterior     09/02/17 photos:        ASSESSMENT/PLAN:   Wound of left lower extremity, subsequent encounter -  Doesn't look actively infected, just slow to heal. Pt inquires about wound care and it's been >1 month since this has been present, minmal healing, consider HBO   Patient Instructions  [facility] [address] [place], [place], [place] [address] Phone: [phone]     Follow-up plan: Return in about 6 months (around 03/04/2018) for medicare annual wellness visit, sooner if needed .  Visit summary with medication list and pertinent instructions was printed for patient to review, alert [place] if any changes needed. All questions at time of visit were answered - patient instructed to contact office with any additional concerns. ER/RTC precautions were reviewed with the patient and understanding verbalized.     Please note: voice recognition software was used to produce this document, and typos may escape review. Please contact Dr. Sheppard Coil for any needed clarifications.

## 2017-09-04 DIAGNOSIS — H16143 Punctate keratitis, bilateral: Secondary | ICD-10-CM | POA: Diagnosis not present

## 2017-09-04 DIAGNOSIS — H401131 Primary open-angle glaucoma, bilateral, mild stage: Secondary | ICD-10-CM | POA: Diagnosis not present

## 2017-09-12 DIAGNOSIS — I83029 Varicose veins of left lower extremity with ulcer of unspecified site: Secondary | ICD-10-CM | POA: Diagnosis not present

## 2017-09-12 DIAGNOSIS — L97929 Non-pressure chronic ulcer of unspecified part of left lower leg with unspecified severity: Secondary | ICD-10-CM | POA: Diagnosis not present

## 2017-09-12 DIAGNOSIS — I872 Venous insufficiency (chronic) (peripheral): Secondary | ICD-10-CM | POA: Diagnosis not present

## 2017-09-12 DIAGNOSIS — R0989 Other specified symptoms and signs involving the circulatory and respiratory systems: Secondary | ICD-10-CM | POA: Diagnosis not present

## 2017-09-19 ENCOUNTER — Other Ambulatory Visit: Payer: Medicare HMO | Admitting: Vascular Surgery

## 2017-09-19 DIAGNOSIS — L97929 Non-pressure chronic ulcer of unspecified part of left lower leg with unspecified severity: Secondary | ICD-10-CM | POA: Diagnosis not present

## 2017-09-19 DIAGNOSIS — I872 Venous insufficiency (chronic) (peripheral): Secondary | ICD-10-CM | POA: Diagnosis not present

## 2017-09-19 DIAGNOSIS — I83029 Varicose veins of left lower extremity with ulcer of unspecified site: Secondary | ICD-10-CM | POA: Diagnosis not present

## 2017-09-26 DIAGNOSIS — L97922 Non-pressure chronic ulcer of unspecified part of left lower leg with fat layer exposed: Secondary | ICD-10-CM | POA: Diagnosis not present

## 2017-09-26 DIAGNOSIS — I872 Venous insufficiency (chronic) (peripheral): Secondary | ICD-10-CM | POA: Insufficient documentation

## 2017-09-26 DIAGNOSIS — I10 Essential (primary) hypertension: Secondary | ICD-10-CM | POA: Insufficient documentation

## 2017-10-03 DIAGNOSIS — I872 Venous insufficiency (chronic) (peripheral): Secondary | ICD-10-CM | POA: Diagnosis not present

## 2017-10-03 DIAGNOSIS — L97929 Non-pressure chronic ulcer of unspecified part of left lower leg with unspecified severity: Secondary | ICD-10-CM | POA: Diagnosis not present

## 2017-10-03 DIAGNOSIS — L97922 Non-pressure chronic ulcer of unspecified part of left lower leg with fat layer exposed: Secondary | ICD-10-CM | POA: Diagnosis not present

## 2017-10-03 DIAGNOSIS — I83029 Varicose veins of left lower extremity with ulcer of unspecified site: Secondary | ICD-10-CM | POA: Diagnosis not present

## 2017-10-09 DIAGNOSIS — G8929 Other chronic pain: Secondary | ICD-10-CM | POA: Diagnosis not present

## 2017-10-09 DIAGNOSIS — M545 Low back pain: Secondary | ICD-10-CM | POA: Diagnosis not present

## 2017-10-09 DIAGNOSIS — I129 Hypertensive chronic kidney disease with stage 1 through stage 4 chronic kidney disease, or unspecified chronic kidney disease: Secondary | ICD-10-CM | POA: Diagnosis not present

## 2017-10-09 DIAGNOSIS — N184 Chronic kidney disease, stage 4 (severe): Secondary | ICD-10-CM | POA: Diagnosis not present

## 2017-10-10 ENCOUNTER — Ambulatory Visit (INDEPENDENT_AMBULATORY_CARE_PROVIDER_SITE_OTHER): Payer: Medicare HMO | Admitting: Vascular Surgery

## 2017-10-10 ENCOUNTER — Encounter: Payer: Self-pay | Admitting: Vascular Surgery

## 2017-10-10 VITALS — BP 149/85 | HR 74 | Temp 97.7°F | Resp 16 | Ht 62.0 in | Wt 136.0 lb

## 2017-10-10 DIAGNOSIS — I83893 Varicose veins of bilateral lower extremities with other complications: Secondary | ICD-10-CM | POA: Diagnosis not present

## 2017-10-10 NOTE — Progress Notes (Signed)
    Stab Phlebectomy Procedure  Laura Ellis DOB:[birth year]  10/10/2017  Consent signed: Yes  Surgeon:T.F. Jolinda Pinkstaff, MD  Procedure: stab phlebectomy: left leg  BP (!) 149/85   Pulse 74   Temp 97.7 F (36.5 C)   Resp 16   Ht 5\' 2"  (1.575 m)   Wt 136 lb (61.7 kg)   SpO2 98%   BMI 24.87 kg/m   Start time: 10:30   End time: 11:30   Tumescent Anesthesia: 400 cc 0.9% NaCl with 50 cc Lidocaine HCL with 1% Epi and 15 cc 8.4% NaHCO3  Local Anesthesia: 3 cc Lidocaine HCL and NaHCO3 (ratio 2:1)    Stab Phlebectomy: >20 Sites: Thigh and Calf  Patient tolerated procedure well: Yes  Notes:   Description of Procedure:  After marking the course of the secondary varicosities, the patient was placed on the operating table in the supine position, and the left leg was prepped and draped in sterile fashion.    The patient was then put into Trendelenburg position.  Local anesthetic was administered at the previously marked varicosities, and tumescent anesthesia was administered around the vessels.  Greater than 20 stab wounds were made using the tip of an 11 blade. And using the vein hook, the phlebectomies were performed using a hemostat to avulse the varicosities.  Adequate hemostasis was achieved, and steri strips were applied to the stab wound.      ABD pads and thigh high compression stockings were applied as well ace wraps where needed. Blood loss was less than 15 cc.  The patient ambulated out of the operating room having tolerated the procedure well.  Stab phlebectomy of extensive varicosities over her anterior thigh just below her groin extending all the way down to her knee and mid medial calf.  Follow-up in several weeks

## 2017-10-17 DIAGNOSIS — I83029 Varicose veins of left lower extremity with ulcer of unspecified site: Secondary | ICD-10-CM | POA: Diagnosis not present

## 2017-10-17 DIAGNOSIS — I872 Venous insufficiency (chronic) (peripheral): Secondary | ICD-10-CM | POA: Diagnosis not present

## 2017-10-17 DIAGNOSIS — L97929 Non-pressure chronic ulcer of unspecified part of left lower leg with unspecified severity: Secondary | ICD-10-CM | POA: Diagnosis not present

## 2017-10-17 DIAGNOSIS — L97922 Non-pressure chronic ulcer of unspecified part of left lower leg with fat layer exposed: Secondary | ICD-10-CM | POA: Diagnosis not present

## 2017-10-21 ENCOUNTER — Other Ambulatory Visit: Payer: Self-pay | Admitting: Osteopathic Medicine

## 2017-10-24 ENCOUNTER — Encounter: Payer: Self-pay | Admitting: Vascular Surgery

## 2017-10-24 ENCOUNTER — Ambulatory Visit (INDEPENDENT_AMBULATORY_CARE_PROVIDER_SITE_OTHER): Payer: Self-pay | Admitting: Vascular Surgery

## 2017-10-24 VITALS — BP 131/75 | HR 70 | Temp 97.6°F | Resp 16 | Ht 62.0 in | Wt 136.0 lb

## 2017-10-24 DIAGNOSIS — N184 Chronic kidney disease, stage 4 (severe): Secondary | ICD-10-CM | POA: Diagnosis not present

## 2017-10-24 DIAGNOSIS — I83893 Varicose veins of bilateral lower extremities with other complications: Secondary | ICD-10-CM

## 2017-10-24 NOTE — Progress Notes (Signed)
   Patient name: Laura Ellis MRN: [id number] DOB: [birth year] Sex: female  REASON FOR VISIT: Follow-up stab phlebectomy of multiple tributary varicosities left leg  HPI: Laura Ellis is a 84 y.o. female here today for follow-up.  She had undergone prior ablation of her great saphenous vein and then subsequently stab phlebectomy of multiple tributary varicosities.  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  Her stab phlebectomy which are healing nicely  Current Outpatient Medications  Medication Sig Dispense Refill  . amLODipine (NORVASC) 5 MG tablet TAKE ONE TABLET BY MOUTH EVERY DAY 90 tablet 1  . Ascorbic Acid (VITAMIN C) 1000 MG tablet Take 1,000 mg by mouth daily.    . chlorthalidone (HYGROTON) 25 MG tablet TAKE ONE TABLET BY MOUTH EVERY DAY 90 tablet 1  . Coenzyme Q10 (COQ10) 100 MG CAPS Take by mouth daily.    . Cyanocobalamin (VITAMIN B-12 PO) Take 1 tablet by mouth daily.    [name] doxycycline (VIBRA-TABS) 100 MG tablet Take 1 tablet (100 mg total) by mouth 2 (two) times daily. Start pills and seek care if skin red, painful draining, spreading rash 14 tablet 0  . escitalopram (LEXAPRO) 5 MG tablet Take 1 tablet (5 mg total) by mouth at bedtime. 90 tablet 1  . latanoprost (XALATAN) 0.005 % ophthalmic solution     . losartan (COZAAR) 25 MG tablet Take 25 mg by mouth daily.    . Multiple Vitamin (MULTIVITAMIN WITH MINERALS) TABS tablet Take 1 tablet by mouth daily.    [name] NAPROXEN PO Take 1 tablet by mouth 2 (two) times daily.    . Omega-3 Fatty Acids (FISH OIL PO) Take 1 capsule by mouth daily.    [name] OVER THE COUNTER MEDICATION Take 1 tablet by mouth daily.    . Red Yeast Rice 600 MG CAPS Take 600 mg by mouth.    . Urea 40 % GEL Apply to abnormal nail/skin as directed 25 mL 1  . pneumococcal 13-valent conjugate vaccine (PREVNAR 13) SUSP injection      No current facility-administered medications for this visit.      PHYSICAL  EXAM: Vitals:   10/24/17 0956  BP: 131/75  Pulse: 70  Resp: 16  Temp: 97.6 F (36.4 C)  SpO2: 97%  Weight: 136 lb (61.7 kg)  Height: 5\' 2"  (1.575 m)    GENERAL: The patient is a well-nourished female, in no acute distress. The vital signs are documented above. Some bruising but excellent result from stab phlebectomy.  Open to centimeter ulceration with some granulating base on her distal ankle  MEDICAL ISSUES: Stable overall.  I do not palpate pedal pulses but she does have good dorsalis pedis and posterior tibial Doppler flow at her foot.  I do not feel that this is arterial in nature.  She is uncomfortable with the compression but I did explain the critical importance of compression to get this to heal.  She will continue her follow-up with the wound center and see [place] on an as-needed basis   Rosetta Posner, MD [facility] Tel [phone] Pager [phone]

## 2017-10-25 DIAGNOSIS — L97922 Non-pressure chronic ulcer of unspecified part of left lower leg with fat layer exposed: Secondary | ICD-10-CM | POA: Diagnosis not present

## 2017-10-25 DIAGNOSIS — I83029 Varicose veins of left lower extremity with ulcer of unspecified site: Secondary | ICD-10-CM | POA: Diagnosis not present

## 2017-10-25 DIAGNOSIS — I872 Venous insufficiency (chronic) (peripheral): Secondary | ICD-10-CM | POA: Diagnosis not present

## 2017-10-25 DIAGNOSIS — L97929 Non-pressure chronic ulcer of unspecified part of left lower leg with unspecified severity: Secondary | ICD-10-CM | POA: Diagnosis not present

## 2017-10-27 IMAGING — DX DG HIP (WITH OR WITHOUT PELVIS) 2-3V*L*
3 series · 3 of 3 positions shown · non-contrast
Comparison: No priors.

CLINICAL DATA: 82-year-old female with history of left posterior
hip pain for the past 6 months. No known injury.

EXAM:
DG HIP (WITH OR WITHOUT PELVIS) 2-3V LEFT

[pelvis ap]
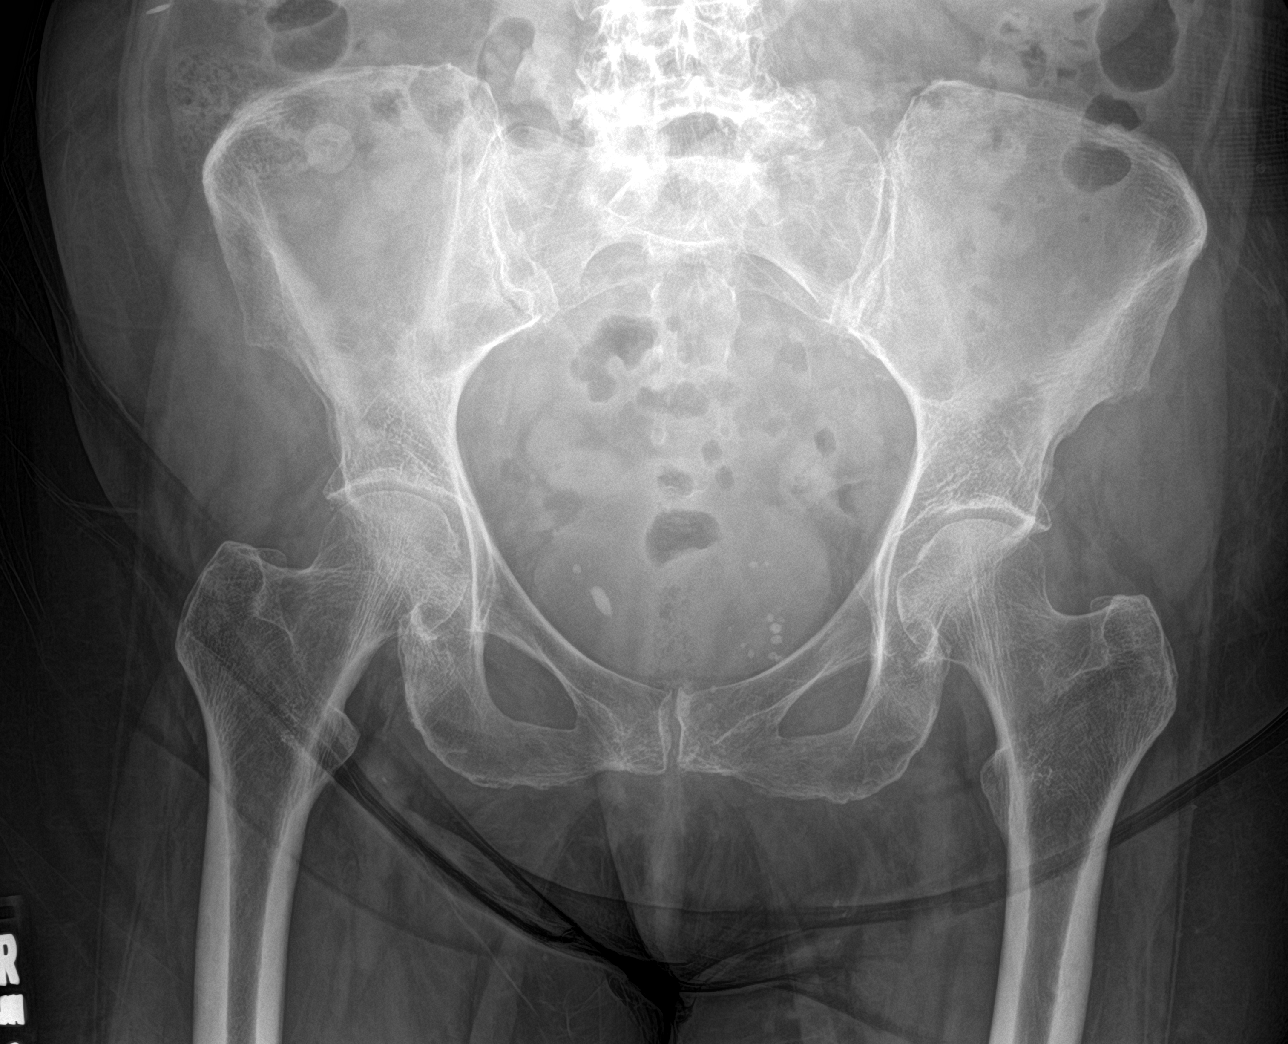

[hip ap]
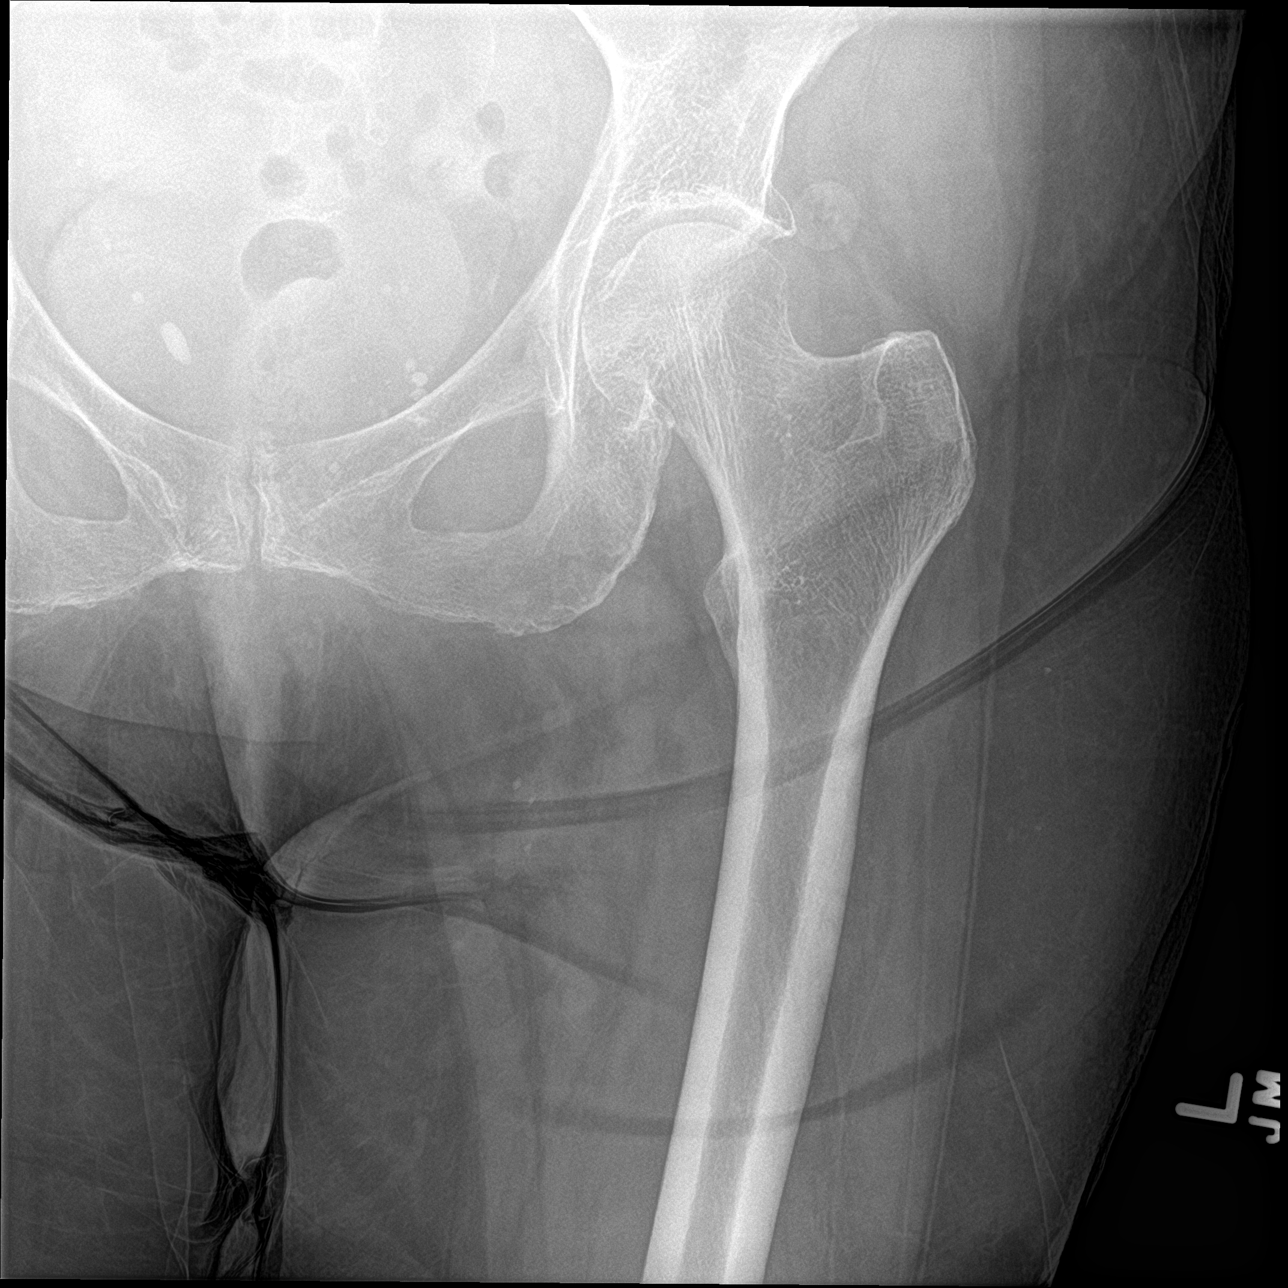

[hip lat]
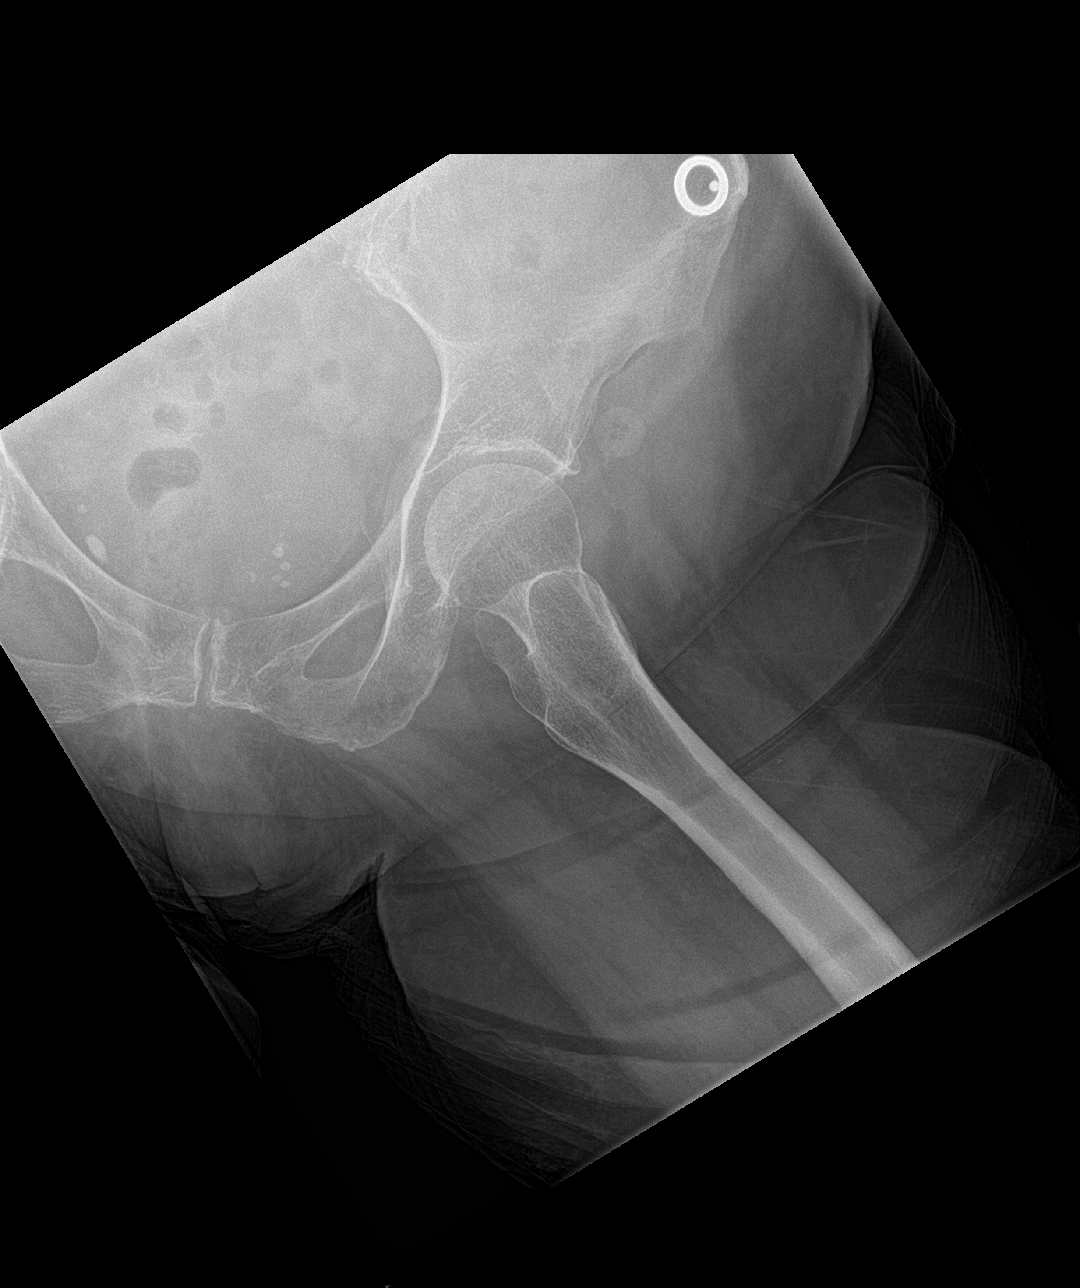

[3 of 3 positions shown; findings below may reference images not displayed]

FINDINGS: AP view of the bony pelvis demonstrates no acute displaced fracture
of the bony pelvic ring. Bilateral proximal femurs as visualized
appear intact. Left femoral head is properly located. There is some
mild joint space narrowing, subchondral sclerosis and osteophyte
formation in the hip joints bilaterally, compatible with mild
osteoarthritis. Numerous pelvic phleboliths are incidentally noted.
IMPRESSION: 1. No acute radiographic abnormality of the bony pelvis or the left
hip.

## 2017-10-30 DIAGNOSIS — I83029 Varicose veins of left lower extremity with ulcer of unspecified site: Secondary | ICD-10-CM | POA: Diagnosis not present

## 2017-10-30 DIAGNOSIS — L97929 Non-pressure chronic ulcer of unspecified part of left lower leg with unspecified severity: Secondary | ICD-10-CM | POA: Diagnosis not present

## 2017-10-30 DIAGNOSIS — L97922 Non-pressure chronic ulcer of unspecified part of left lower leg with fat layer exposed: Secondary | ICD-10-CM | POA: Diagnosis not present

## 2017-10-30 DIAGNOSIS — I872 Venous insufficiency (chronic) (peripheral): Secondary | ICD-10-CM | POA: Diagnosis not present

## 2017-11-04 ENCOUNTER — Telehealth: Payer: Self-pay | Admitting: *Deleted

## 2017-11-04 NOTE — Telephone Encounter (Signed)
Returning Laura Ellis earlier telephone voice message regarding ABI results done at [place] that [name] MD (her [place] physician at [facility]) wants Dr. Donnetta Hutching to review.  Laura Ellis is an established patient of [name] MD with non-healing ulcer of L ankle and was last seen by Dr. Donnetta Hutching on 10-24-2017.  Laura Ellis faxed her ABI done at [facility] to [facility] on 11-04-2017.  She requests that Dr. Donnetta Hutching review ABI report and give his recommendations.

## 2017-11-20 ENCOUNTER — Telehealth: Payer: Self-pay | Admitting: *Deleted

## 2017-11-20 NOTE — Telephone Encounter (Signed)
Messages left (7/9 and 7/10) on voice mail for patient to call back regarding recommendations for treatment after reviewing vascular study results as patient had requested. Left instructions for patient to call this nurse back.

## 2017-12-10 ENCOUNTER — Ambulatory Visit (INDEPENDENT_AMBULATORY_CARE_PROVIDER_SITE_OTHER): Payer: Self-pay | Admitting: Vascular Surgery

## 2017-12-10 ENCOUNTER — Encounter: Payer: Self-pay | Admitting: Vascular Surgery

## 2017-12-10 ENCOUNTER — Other Ambulatory Visit: Payer: Self-pay

## 2017-12-10 VITALS — BP 161/65 | HR 80 | Temp 97.1°F | Resp 14 | Ht 62.0 in | Wt 139.0 lb

## 2017-12-10 DIAGNOSIS — I739 Peripheral vascular disease, unspecified: Secondary | ICD-10-CM

## 2017-12-10 DIAGNOSIS — I83893 Varicose veins of bilateral lower extremities with other complications: Secondary | ICD-10-CM

## 2017-12-10 NOTE — Progress Notes (Signed)
[facility] [place]  Patient name: Laura Ellis MRN: [id number] DOB: [birth year] Sex: female  REASON FOR VISIT: Continued follow-up of left leg venous ulcer  HPI: Laura Ellis is a 84 y.o. female here today for continued follow-up of left leg venous ulcer.  She had undergone ablation of her great saphenous vein and stab phlebectomy of multiple tributary varicosities.  She does have known moderate arterial insufficiency and is seen again to assure continued healing.  Her last visit with her she had undergone a noninvasive arterial study at [facility].  This revealed a normal ankle arm index on the right and mildly diminished on the left at 0.76.  She is here today for further follow-up.  She does have a history of stage IV chronic kidney disease  Past Medical History:  Diagnosis Date  . Arthritis   . Chronic low back pain 05/26/2015  . CKD (chronic kidney disease) stage 4, GFR 15-29 ml/min (HCC) 02/23/2016  . Essential hypertension 05/26/2015  . Hypertension   . Hypothyroidism 07/05/2015  . Osteopenia   . Osteoporosis 05/26/2015   old Dexa reviewed, pt states doesn't want to be on the bisphosphonate therapy but is taking Ca and D     History reviewed. No pertinent family history.  SOCIAL HISTORY: Social History   Tobacco Use  . Smoking status: Former Research scientist (life sciences)  . Smokeless tobacco: Former Systems developer    Quit date: 12/03/2001  Substance Use Topics  . Alcohol use: Yes    Comment: occ    Allergies  Allergen Reactions  . Statins Other (See Comments)    Muscle aches  . Ace Inhibitors     Cough  . Alendronate Sodium     esophagitis  . Ezetimibe     (Zetia)  . Fenofibrate     arthralgia  . Other   . Rosuvastatin Calcium   . Rosuvastatin Calcium     Current Outpatient Medications  Medication Sig Dispense Refill  . amLODipine (NORVASC) 5 MG tablet TAKE ONE TABLET BY MOUTH EVERY DAY 90 tablet 1  . Ascorbic Acid  (VITAMIN C) 1000 MG tablet Take 1,000 mg by mouth daily.    . chlorthalidone (HYGROTON) 25 MG tablet TAKE ONE TABLET BY MOUTH EVERY DAY 90 tablet 1  . Coenzyme Q10 (COQ10) 100 MG CAPS Take by mouth daily.    . Cyanocobalamin (VITAMIN B-12 PO) Take 1 tablet by mouth daily.    [name] doxycycline (VIBRA-TABS) 100 MG tablet Take 1 tablet (100 mg total) by mouth 2 (two) times daily. Start pills and seek care if skin red, painful draining, spreading rash 14 tablet 0  . escitalopram (LEXAPRO) 5 MG tablet Take 1 tablet (5 mg total) by mouth at bedtime. 90 tablet 1  . latanoprost (XALATAN) 0.005 % ophthalmic solution     . losartan (COZAAR) 25 MG tablet Take 25 mg by mouth daily.    . Multiple Vitamin (MULTIVITAMIN WITH MINERALS) TABS tablet Take 1 tablet by mouth daily.    [name] NAPROXEN PO Take 1 tablet by mouth 2 (two) times daily.    . Omega-3 Fatty Acids (FISH OIL PO) Take 1 capsule by mouth daily.    [name] OVER THE COUNTER MEDICATION Take 1 tablet by mouth daily.    . pneumococcal 13-valent conjugate vaccine (PREVNAR 13) SUSP injection     . Red Yeast Rice 600 MG CAPS Take 600 mg by mouth.    . Urea 40 % GEL Apply to abnormal nail/skin  as directed 25 mL 1   No current facility-administered medications for this visit.     REVIEW OF SYSTEMS:  [X]  denotes positive finding, [ ]  denotes negative finding Cardiac  Comments:  Chest pain or chest pressure:    Shortness of breath upon exertion:    Short of breath when lying flat:    Irregular heart rhythm:        Vascular    Pain in calf, thigh, or hip brought on by ambulation:    Pain in feet at night that wakes you up from your sleep:     Blood clot in your veins:    Leg swelling:  x         PHYSICAL EXAM: Vitals:   12/10/17 1505 12/10/17 1511  BP: (!) 142/72 (!) 161/65  Pulse: 81 80  Resp: 14   Temp: (!) 97.1 F (36.2 C)   TempSrc: Oral   Weight: 139 lb (63 kg)   Height: 5\' 2"  (1.575 m)     GENERAL: The patient is a well-nourished female, in  no acute distress. The vital signs are documented above. CARDIOVASCULAR: I do not palpate pedal pulse on the left or right lower extremity. PULMONARY: There is good air exchange  MUSCULOSKELETAL: There are no major deformities or cyanosis. NEUROLOGIC: No focal weakness or paresthesias are detected. SKIN: Superficial ulceration over the pretibial area on the left.  Good granulation base with slight fibrinous exudate PSYCHIATRIC: The patient has a normal affect.  DATA:  Noninvasive studies from outside facility show normal ankle arm index in the right and ankle arm index of 0.76 on the left  MEDICAL ISSUES: Long discussion with the patient.  She clearly is having improvement in her ulceration.  Explained that this will take great deal of time with the typical healing for venous stasis disease.  I have recommended the importance of continued knee-high compression and topical treatment that she is doing.  I would reserve any invasive studies such as arteriography for worsening of her ulcer.  Fortunately this is currently not the case.  She is seeing the [facility] wound center later in the week for discussion of potential hyperbaric oxygen treatment.  Do not see any contraindication to this modality.  He will notify should she develop any worsening of her wound and otherwise will see [place] on an as-needed basis    Rosetta Posner, MD [facility] Tel [phone] Pager [phone]

## 2017-12-10 NOTE — Progress Notes (Signed)
Vitals:   12/10/17 1505  BP: (!) 142/72  Pulse: 81  Resp: 14  Temp: (!) 97.1 F (36.2 C)  TempSrc: Oral  Weight: 139 lb (63 kg)  Height: 5\' 2"  (1.575 m)

## 2017-12-13 ENCOUNTER — Encounter (HOSPITAL_BASED_OUTPATIENT_CLINIC_OR_DEPARTMENT_OTHER): Payer: Medicare HMO

## 2017-12-19 ENCOUNTER — Encounter (HOSPITAL_BASED_OUTPATIENT_CLINIC_OR_DEPARTMENT_OTHER): Payer: Medicare HMO | Attending: Internal Medicine

## 2017-12-19 DIAGNOSIS — N184 Chronic kidney disease, stage 4 (severe): Secondary | ICD-10-CM | POA: Diagnosis not present

## 2017-12-19 DIAGNOSIS — I872 Venous insufficiency (chronic) (peripheral): Secondary | ICD-10-CM | POA: Diagnosis not present

## 2017-12-19 DIAGNOSIS — I130 Hypertensive heart and chronic kidney disease with heart failure and stage 1 through stage 4 chronic kidney disease, or unspecified chronic kidney disease: Secondary | ICD-10-CM | POA: Diagnosis not present

## 2017-12-19 DIAGNOSIS — Z87891 Personal history of nicotine dependence: Secondary | ICD-10-CM | POA: Diagnosis not present

## 2017-12-19 DIAGNOSIS — I509 Heart failure, unspecified: Secondary | ICD-10-CM | POA: Diagnosis not present

## 2017-12-19 DIAGNOSIS — L97822 Non-pressure chronic ulcer of other part of left lower leg with fat layer exposed: Secondary | ICD-10-CM | POA: Insufficient documentation

## 2017-12-20 DIAGNOSIS — L97922 Non-pressure chronic ulcer of unspecified part of left lower leg with fat layer exposed: Secondary | ICD-10-CM | POA: Diagnosis not present

## 2017-12-31 DIAGNOSIS — H401131 Primary open-angle glaucoma, bilateral, mild stage: Secondary | ICD-10-CM | POA: Diagnosis not present

## 2018-01-01 DIAGNOSIS — N184 Chronic kidney disease, stage 4 (severe): Secondary | ICD-10-CM | POA: Diagnosis not present

## 2018-01-01 DIAGNOSIS — I872 Venous insufficiency (chronic) (peripheral): Secondary | ICD-10-CM | POA: Diagnosis not present

## 2018-01-01 DIAGNOSIS — Z87891 Personal history of nicotine dependence: Secondary | ICD-10-CM | POA: Diagnosis not present

## 2018-01-01 DIAGNOSIS — I509 Heart failure, unspecified: Secondary | ICD-10-CM | POA: Diagnosis not present

## 2018-01-01 DIAGNOSIS — I130 Hypertensive heart and chronic kidney disease with heart failure and stage 1 through stage 4 chronic kidney disease, or unspecified chronic kidney disease: Secondary | ICD-10-CM | POA: Diagnosis not present

## 2018-01-01 DIAGNOSIS — L97822 Non-pressure chronic ulcer of other part of left lower leg with fat layer exposed: Secondary | ICD-10-CM | POA: Diagnosis not present

## 2018-01-02 ENCOUNTER — Telehealth: Payer: Self-pay

## 2018-01-02 NOTE — Telephone Encounter (Signed)
Pt wanted provider to be aware that an external referral request from Dr. Jeri Cos will be sent to the office. It is regarding a graft for an ulcer on pt's leg.

## 2018-01-03 NOTE — Telephone Encounter (Signed)
Noted  

## 2018-01-03 NOTE — Telephone Encounter (Signed)
OK that's fine, will address if needed when I see it in the inbasket.

## 2018-01-07 ENCOUNTER — Ambulatory Visit: Payer: Medicare HMO | Admitting: Osteopathic Medicine

## 2018-01-08 ENCOUNTER — Telehealth: Payer: Self-pay

## 2018-01-08 DIAGNOSIS — L97822 Non-pressure chronic ulcer of other part of left lower leg with fat layer exposed: Secondary | ICD-10-CM | POA: Diagnosis not present

## 2018-01-08 DIAGNOSIS — I872 Venous insufficiency (chronic) (peripheral): Secondary | ICD-10-CM | POA: Diagnosis not present

## 2018-01-08 DIAGNOSIS — I130 Hypertensive heart and chronic kidney disease with heart failure and stage 1 through stage 4 chronic kidney disease, or unspecified chronic kidney disease: Secondary | ICD-10-CM | POA: Diagnosis not present

## 2018-01-08 DIAGNOSIS — I509 Heart failure, unspecified: Secondary | ICD-10-CM | POA: Diagnosis not present

## 2018-01-08 DIAGNOSIS — Z87891 Personal history of nicotine dependence: Secondary | ICD-10-CM | POA: Diagnosis not present

## 2018-01-08 DIAGNOSIS — N184 Chronic kidney disease, stage 4 (severe): Secondary | ICD-10-CM | POA: Diagnosis not present

## 2018-01-08 NOTE — Telephone Encounter (Signed)
Shetara from the wound care center called stating a peer to peer review is required. Ins is requesting authorization for pt to continue wkly wound care for an ulcer on her left leg.  Charlyne Quale will fax over the necessary information or provider can call [phone]. She can be reached at [phone] for add'l information.

## 2018-01-09 ENCOUNTER — Telehealth: Payer: Self-pay

## 2018-01-09 NOTE — Telephone Encounter (Signed)
Paperwork placed in provider's box for review.

## 2018-01-10 ENCOUNTER — Telehealth: Payer: Self-pay | Admitting: Osteopathic Medicine

## 2018-01-10 NOTE — Telephone Encounter (Signed)
Received notification that the referral needs to be in place for TheraSkin for L leg ulcer - please call [phone] to see what kind of referral is being requested (wound care? Home health?) and can place verbal order for whatever is needed. Thanks!

## 2018-01-10 NOTE — Telephone Encounter (Signed)
This goes with the non urgent referral from [facility]. Thank you!

## 2018-01-15 ENCOUNTER — Encounter (HOSPITAL_BASED_OUTPATIENT_CLINIC_OR_DEPARTMENT_OTHER): Payer: Medicare HMO | Attending: Physician Assistant

## 2018-01-15 DIAGNOSIS — L97822 Non-pressure chronic ulcer of other part of left lower leg with fat layer exposed: Secondary | ICD-10-CM | POA: Diagnosis not present

## 2018-01-15 DIAGNOSIS — Z87891 Personal history of nicotine dependence: Secondary | ICD-10-CM | POA: Diagnosis not present

## 2018-01-15 DIAGNOSIS — I739 Peripheral vascular disease, unspecified: Secondary | ICD-10-CM | POA: Insufficient documentation

## 2018-01-15 DIAGNOSIS — I129 Hypertensive chronic kidney disease with stage 1 through stage 4 chronic kidney disease, or unspecified chronic kidney disease: Secondary | ICD-10-CM | POA: Diagnosis not present

## 2018-01-15 DIAGNOSIS — I872 Venous insufficiency (chronic) (peripheral): Secondary | ICD-10-CM | POA: Diagnosis not present

## 2018-01-15 DIAGNOSIS — N184 Chronic kidney disease, stage 4 (severe): Secondary | ICD-10-CM | POA: Diagnosis not present

## 2018-01-15 DIAGNOSIS — M810 Age-related osteoporosis without current pathological fracture: Secondary | ICD-10-CM | POA: Diagnosis not present

## 2018-01-16 ENCOUNTER — Ambulatory Visit (INDEPENDENT_AMBULATORY_CARE_PROVIDER_SITE_OTHER): Payer: Medicare HMO | Admitting: Osteopathic Medicine

## 2018-01-16 ENCOUNTER — Encounter: Payer: Self-pay | Admitting: Osteopathic Medicine

## 2018-01-16 VITALS — BP 149/75 | HR 71 | Temp 98.2°F | Wt 140.1 lb

## 2018-01-16 DIAGNOSIS — R413 Other amnesia: Secondary | ICD-10-CM | POA: Insufficient documentation

## 2018-01-16 DIAGNOSIS — Z7189 Other specified counseling: Secondary | ICD-10-CM | POA: Diagnosis not present

## 2018-01-16 NOTE — Progress Notes (Signed)
HPI: Laura Ellis is a 84 y.o. female who  has a past medical history of Arthritis, Chronic low back pain (05/26/2015), CKD (chronic kidney disease) stage 4, GFR 15-29 ml/min (HCC) (02/23/2016), Essential hypertension (05/26/2015), Hypertension, Hypothyroidism (07/05/2015), Osteopenia, and Osteoporosis (05/26/2015).  she presents to [facility] today, 01/16/18,  for chief complaint of:  Advanced directives Concern for memory problems  Would like to discuss advanced directives - not sure if she has one on file.  She does not want any kind of life prolonging measures such as CPR/life support/intubation, feeding tube, tracheostomy.  She is open to intubation in respiratory illness such as pneumonia if absolutely needed but would rather not undergo this procedure.   Concerned about needing testing for dementia - she says she is having conversations with her daughters and losing track of what he is talking about, can't recall the thread of the conversation. Can keep up with daily routine but will sometimes will go into rooms or open the fridge not remembering why she's in those places or doing these things.       Past medical history, surgical history, and family history reviewed.  Current medication list and allergy/intolerance information reviewed.   (See remainder of HPI, ROS, Phys Exam below)    ASSESSMENT/PLAN:   Advance directive discussed with patient - Sharyn Lull, daughter who lives in the area, healthcare proxy.  No CPR, no intubation unless for respiratory disease such as pneumonia.  Memory loss - Concerning for MCI versus dementia.  Will return for [facility] cognitive assessment screening, consider medications/labs/MRI    Patient Instructions  Plan:  Will look into TheraSkin referral - I believe we addressed this last week but I'll follow up on it   Will have you back for longer visit to go over memory testing - I encourage family Sharyn Lull, if  she is available) to come to this visit with you. We can talk about other blood tests, MRI brain, possible medications.   Advanced Directive information provided - let me know if you have any questions!    Follow-up plan: Return for memory testing (FYI for scheduler - OV40 needed).             ############################################ ############################################ ############################################ ############################################    Outpatient Encounter Medications as of 01/16/2018  Medication Sig Note  . amLODipine (NORVASC) 5 MG tablet TAKE ONE TABLET BY MOUTH EVERY DAY   . Ascorbic Acid (VITAMIN C) 1000 MG tablet Take 1,000 mg by mouth daily.   . chlorthalidone (HYGROTON) 25 MG tablet TAKE ONE TABLET BY MOUTH EVERY DAY   . Coenzyme Q10 (COQ10) 100 MG CAPS Take by mouth daily.   . Cyanocobalamin (VITAMIN B-12 PO) Take 1 tablet by mouth daily.   [name] doxycycline (VIBRA-TABS) 100 MG tablet Take 1 tablet (100 mg total) by mouth 2 (two) times daily. Start pills and seek care if skin red, painful draining, spreading rash   . escitalopram (LEXAPRO) 5 MG tablet Take 1 tablet (5 mg total) by mouth at bedtime.   [name] latanoprost (XALATAN) 0.005 % ophthalmic solution    . losartan (COZAAR) 25 MG tablet Take 25 mg by mouth daily.   . Multiple Vitamin (MULTIVITAMIN WITH MINERALS) TABS tablet Take 1 tablet by mouth daily.   [name] NAPROXEN PO Take 1 tablet by mouth 2 (two) times daily. 11/19/2013: Patient is not sure of the strength.  . Omega-3 Fatty Acids (FISH OIL PO) Take 1 capsule by mouth daily.   [name] OVER  THE COUNTER MEDICATION Take 1 tablet by mouth daily. 11/19/2013: Kosher vitamin.  . pneumococcal 13-valent conjugate vaccine (PREVNAR 13) SUSP injection  06/02/2015: Received from: [facility]  . Red Yeast Rice 600 MG CAPS Take 600 mg by mouth.   . Urea 40 % GEL Apply to abnormal nail/skin as directed    No facility-administered encounter medications  on file as of 01/16/2018.    Allergies  Allergen Reactions  . Statins Other (See Comments)    Muscle aches  . Ace Inhibitors     Cough  . Alendronate Sodium     esophagitis  . Ezetimibe     (Zetia)  . Fenofibrate     arthralgia  . Other   . Rosuvastatin Calcium   . Rosuvastatin Calcium       Review of Systems:  Constitutional: No recent illness  Cardiac: No  chest pain, No  pressure, No palpitations  Respiratory:  No  shortness of breath. No  Cough  Gastrointestinal: No  abdominal pain  Musculoskeletal: No new myalgia/arthralgia  Skin: No  Rash  Neurologic: No  weakness, No  Dizziness  Psychiatric: +concerns with depression the death of her husband, No  concerns with anxiety  Exam:  BP (!) 149/75 (BP Location: Left Arm, Patient Position: Sitting, Cuff Size: Normal)   Pulse 71   Temp 98.2 F (36.8 C) (Oral)   Wt 140 lb 1.6 oz (63.5 kg)   BMI 25.62 kg/m   Constitutional: VS see above. General Appearance: alert, well-developed, well-nourished, NAD  Eyes: Normal lids and conjunctive, non-icteric sclera  Ears, Nose, Mouth, Throat: MMM, Normal external inspection ears/nares/mouth/lips/gums.  Neck: No masses, trachea midline.   Respiratory: Normal respiratory effort. no wheeze, no rhonchi, no rales  Cardiovascular: S1/S2 normal, no murmur, no rub/gallop auscultated. RRR.   Musculoskeletal: Gait normal.   Neurological: Normal balance/coordination. No tremor.  Skin: warm, dry, intact.   Psychiatric: Normal judgment/insight. Normal mood and affect. Oriented x3.   Visit summary with medication list and pertinent instructions was printed for patient to review, advised to alert [place] if any changes needed. All questions at time of visit were answered - patient instructed to contact office with any additional concerns. ER/RTC precautions were reviewed with the patient and understanding verbalized.   Follow-up plan: Return for memory testing (FYI for scheduler - OV40  needed).  Note: Total time spent 25 minutes, greater than 50% of the visit was spent face-to-face counseling and coordinating care for the following: The primary encounter diagnosis was Advance directive discussed with patient. A diagnosis of Memory loss was also pertinent to this visit.[name]  Please note: voice recognition software was used to produce this document, and typos may escape review. Please contact Dr. Sheppard Coil for any needed clarifications.

## 2018-01-16 NOTE — Patient Instructions (Signed)
Plan:  Will look into TheraSkin referral - I believe we addressed this last week but I'll follow up on it   Will have you back for longer visit to go over memory testing - I encourage family Sharyn Lull, if she is available) to come to this visit with you. We can talk about other blood tests, MRI brain, possible medications.   Advanced Directive information provided - let me know if you have any questions!

## 2018-01-20 NOTE — Telephone Encounter (Signed)
No authorization required.

## 2018-01-22 DIAGNOSIS — L97822 Non-pressure chronic ulcer of other part of left lower leg with fat layer exposed: Secondary | ICD-10-CM | POA: Diagnosis not present

## 2018-01-22 DIAGNOSIS — M810 Age-related osteoporosis without current pathological fracture: Secondary | ICD-10-CM | POA: Diagnosis not present

## 2018-01-22 DIAGNOSIS — N184 Chronic kidney disease, stage 4 (severe): Secondary | ICD-10-CM | POA: Diagnosis not present

## 2018-01-22 DIAGNOSIS — Z87891 Personal history of nicotine dependence: Secondary | ICD-10-CM | POA: Diagnosis not present

## 2018-01-22 DIAGNOSIS — I872 Venous insufficiency (chronic) (peripheral): Secondary | ICD-10-CM | POA: Diagnosis not present

## 2018-01-22 DIAGNOSIS — I739 Peripheral vascular disease, unspecified: Secondary | ICD-10-CM | POA: Diagnosis not present

## 2018-01-22 DIAGNOSIS — I129 Hypertensive chronic kidney disease with stage 1 through stage 4 chronic kidney disease, or unspecified chronic kidney disease: Secondary | ICD-10-CM | POA: Diagnosis not present

## 2018-01-29 DIAGNOSIS — I129 Hypertensive chronic kidney disease with stage 1 through stage 4 chronic kidney disease, or unspecified chronic kidney disease: Secondary | ICD-10-CM | POA: Diagnosis not present

## 2018-01-29 DIAGNOSIS — L97822 Non-pressure chronic ulcer of other part of left lower leg with fat layer exposed: Secondary | ICD-10-CM | POA: Diagnosis not present

## 2018-01-29 DIAGNOSIS — N184 Chronic kidney disease, stage 4 (severe): Secondary | ICD-10-CM | POA: Diagnosis not present

## 2018-01-29 DIAGNOSIS — I739 Peripheral vascular disease, unspecified: Secondary | ICD-10-CM | POA: Diagnosis not present

## 2018-01-29 DIAGNOSIS — M810 Age-related osteoporosis without current pathological fracture: Secondary | ICD-10-CM | POA: Diagnosis not present

## 2018-01-29 DIAGNOSIS — Z87891 Personal history of nicotine dependence: Secondary | ICD-10-CM | POA: Diagnosis not present

## 2018-01-29 DIAGNOSIS — I872 Venous insufficiency (chronic) (peripheral): Secondary | ICD-10-CM | POA: Diagnosis not present

## 2018-02-03 ENCOUNTER — Ambulatory Visit: Payer: Medicare HMO | Admitting: Osteopathic Medicine

## 2018-02-05 DIAGNOSIS — I129 Hypertensive chronic kidney disease with stage 1 through stage 4 chronic kidney disease, or unspecified chronic kidney disease: Secondary | ICD-10-CM | POA: Diagnosis not present

## 2018-02-05 DIAGNOSIS — I872 Venous insufficiency (chronic) (peripheral): Secondary | ICD-10-CM | POA: Diagnosis not present

## 2018-02-05 DIAGNOSIS — M810 Age-related osteoporosis without current pathological fracture: Secondary | ICD-10-CM | POA: Diagnosis not present

## 2018-02-05 DIAGNOSIS — N184 Chronic kidney disease, stage 4 (severe): Secondary | ICD-10-CM | POA: Diagnosis not present

## 2018-02-05 DIAGNOSIS — L97822 Non-pressure chronic ulcer of other part of left lower leg with fat layer exposed: Secondary | ICD-10-CM | POA: Diagnosis not present

## 2018-02-05 DIAGNOSIS — I739 Peripheral vascular disease, unspecified: Secondary | ICD-10-CM | POA: Diagnosis not present

## 2018-02-05 DIAGNOSIS — Z87891 Personal history of nicotine dependence: Secondary | ICD-10-CM | POA: Diagnosis not present

## 2018-02-12 ENCOUNTER — Encounter (HOSPITAL_BASED_OUTPATIENT_CLINIC_OR_DEPARTMENT_OTHER): Payer: Medicare HMO | Attending: Physician Assistant

## 2018-02-12 DIAGNOSIS — N184 Chronic kidney disease, stage 4 (severe): Secondary | ICD-10-CM | POA: Diagnosis not present

## 2018-02-12 DIAGNOSIS — Z87891 Personal history of nicotine dependence: Secondary | ICD-10-CM | POA: Diagnosis not present

## 2018-02-12 DIAGNOSIS — I130 Hypertensive heart and chronic kidney disease with heart failure and stage 1 through stage 4 chronic kidney disease, or unspecified chronic kidney disease: Secondary | ICD-10-CM | POA: Insufficient documentation

## 2018-02-12 DIAGNOSIS — L97822 Non-pressure chronic ulcer of other part of left lower leg with fat layer exposed: Secondary | ICD-10-CM | POA: Insufficient documentation

## 2018-02-12 DIAGNOSIS — M810 Age-related osteoporosis without current pathological fracture: Secondary | ICD-10-CM | POA: Diagnosis not present

## 2018-02-12 DIAGNOSIS — I872 Venous insufficiency (chronic) (peripheral): Secondary | ICD-10-CM | POA: Insufficient documentation

## 2018-02-28 IMAGING — US US RENAL
1 series · 14 of 25 positions shown · non-contrast
Comparison: None.

CLINICAL DATA: Chronic renal disease.

EXAM:
RENAL / URINARY TRACT ULTRASOUND COMPLETE

[Series 1: us renal · 0.19mm/px · 14 of 43 slices shown]
[im 1/43]
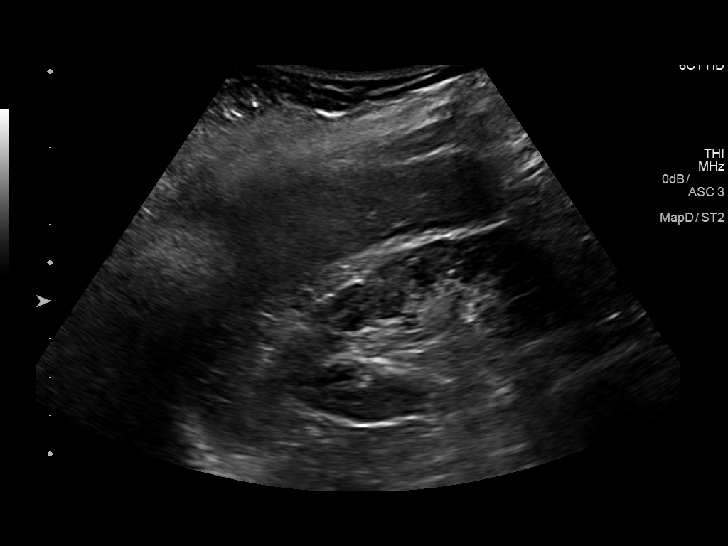
[im 4/43]
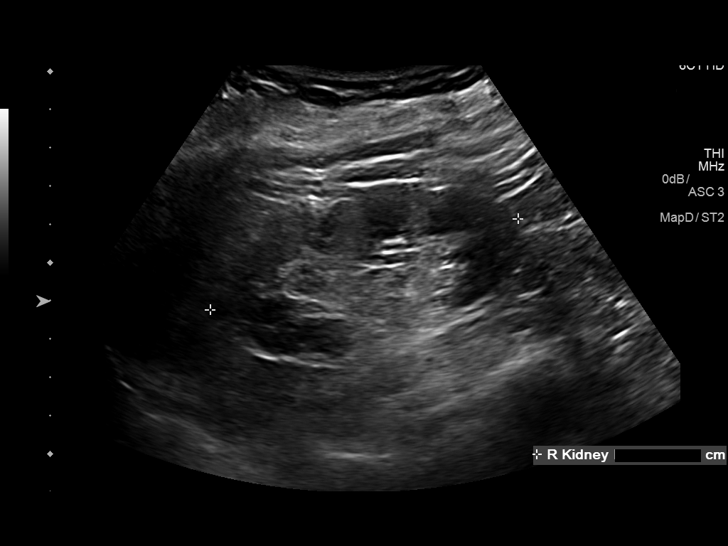
[im 8/43]
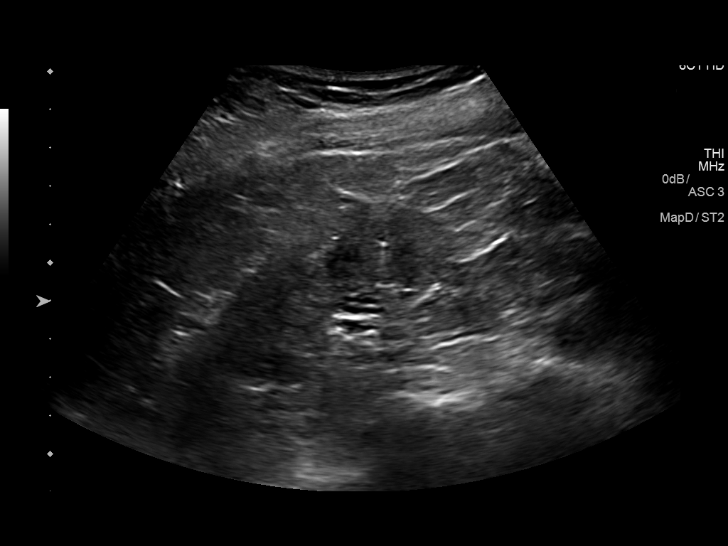
[im 11/43]
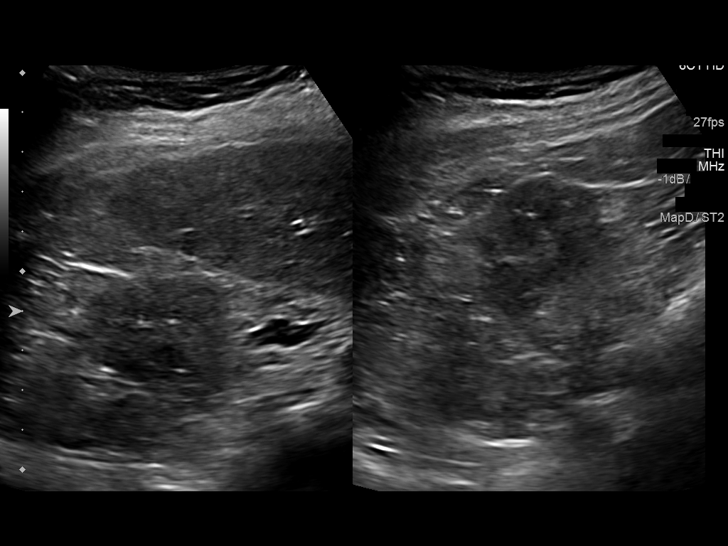
[im 15/43]
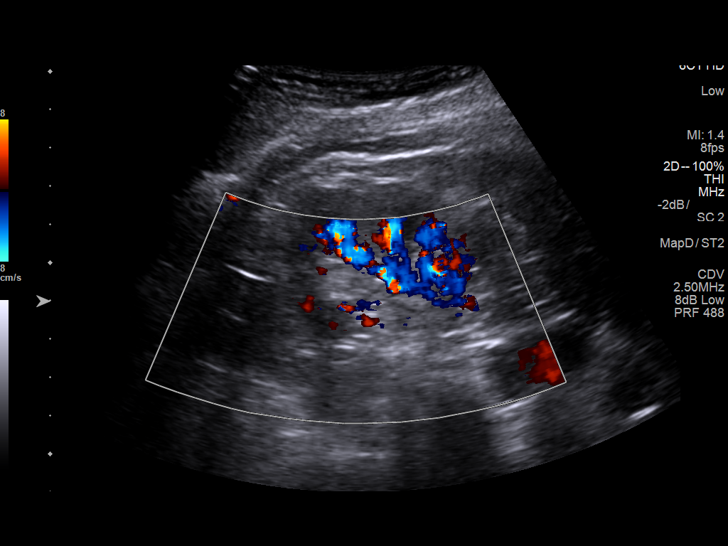
[im 16/43]
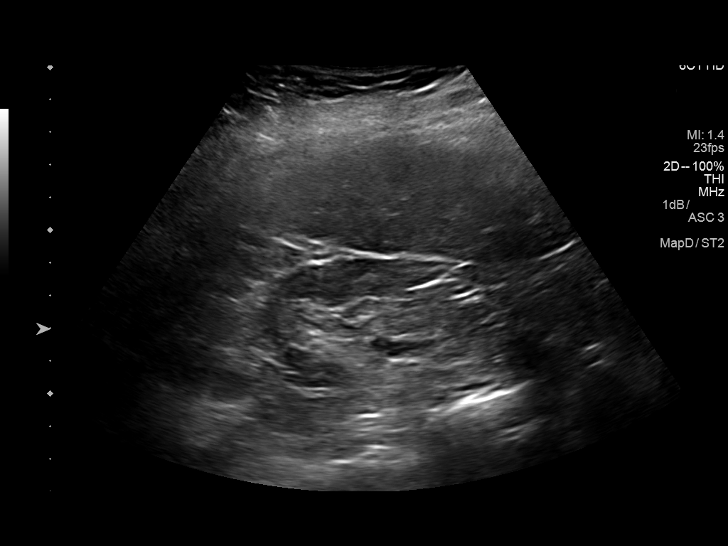
[im 20/43]
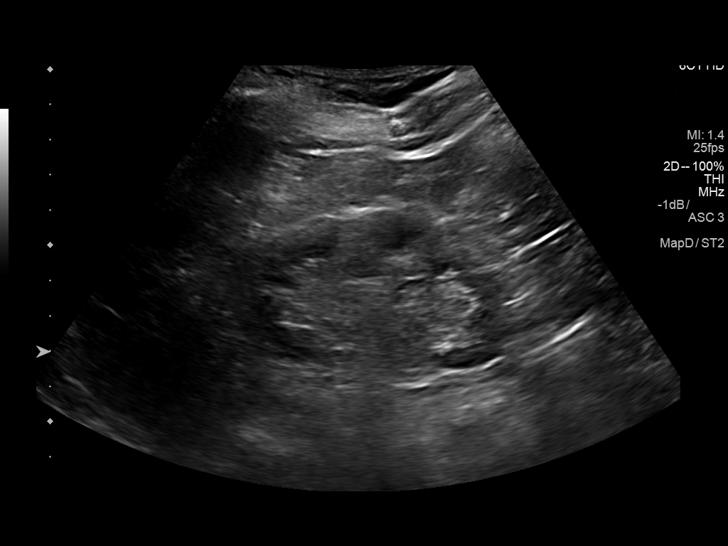
[im 23/43]
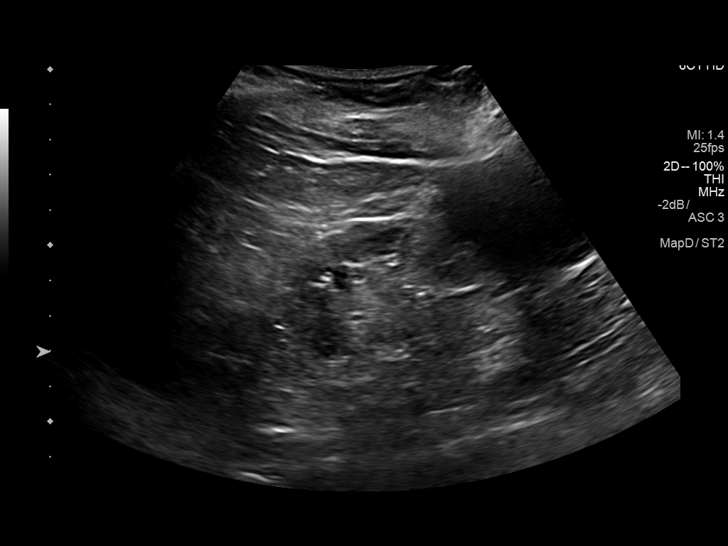
[im 27/43]
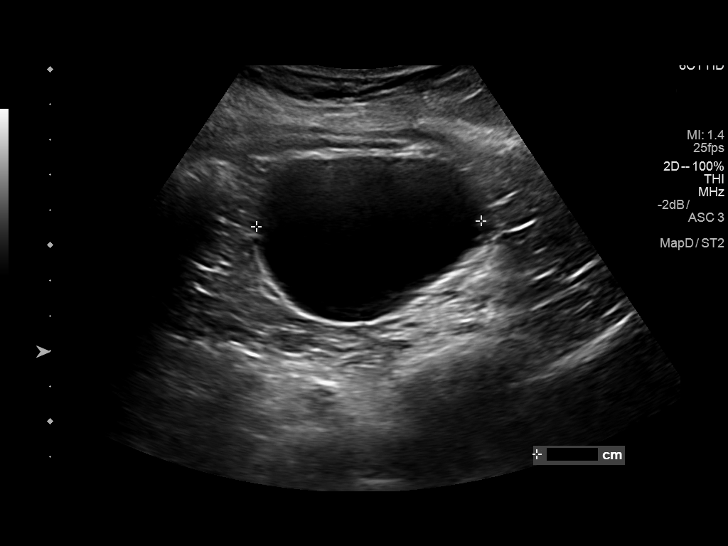
[im 29/43]
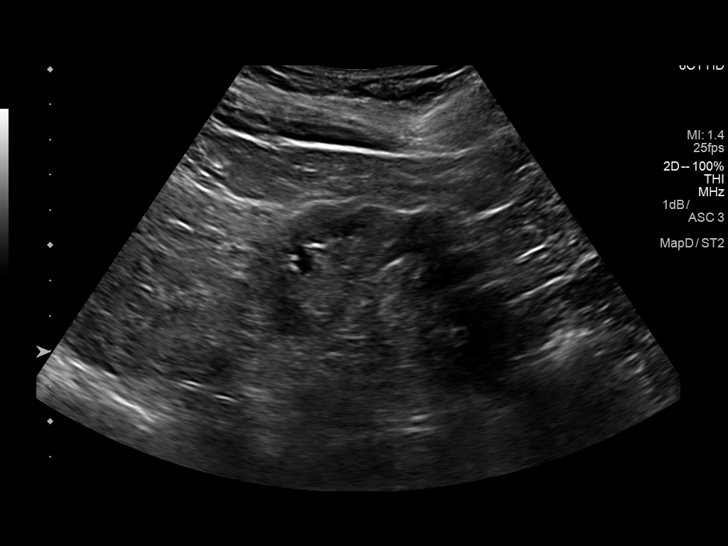
[im 32/43]
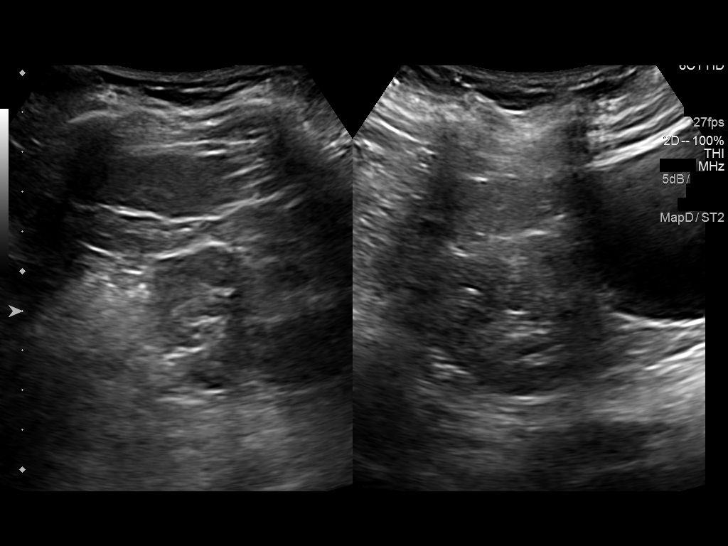
[im 36/43]
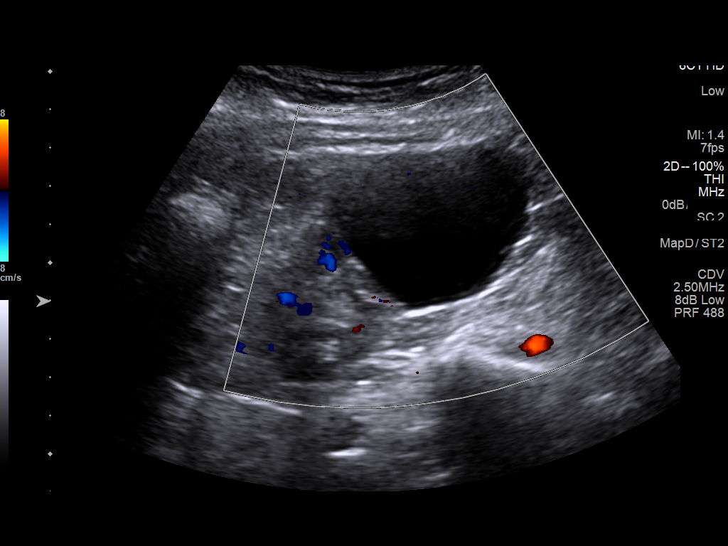
[im 39/43]
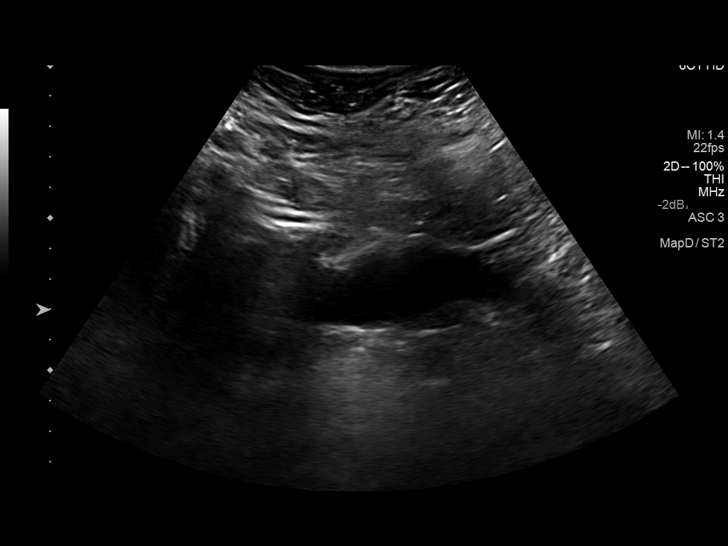
[im 43/43]
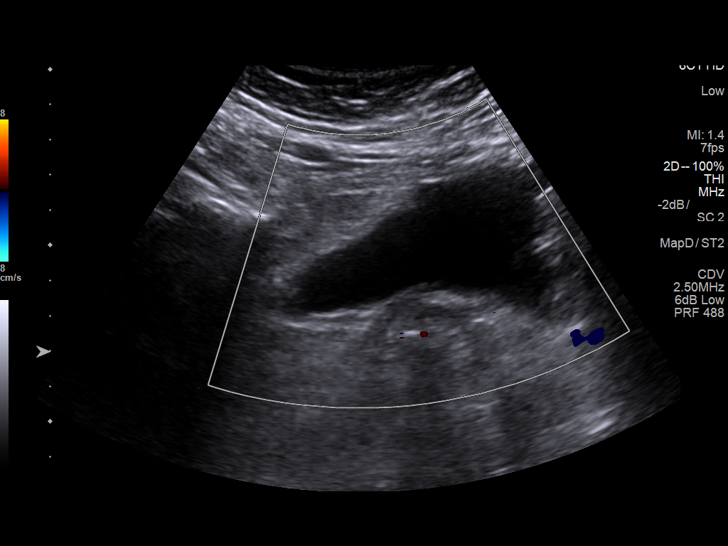

[14 of 25 positions shown; findings below may reference images not displayed]

FINDINGS: Right Kidney:

Length: 8.4 cm. Echogenicity within normal limits. No mass or
hydronephrosis visualized.

Left Kidney:

Length: 8.2 cm.  7.6 cm cyst off the left kidney.

Bladder:

Appears normal for degree of bladder distention.
IMPRESSION: Large left renal cyst.  No other acute abnormalities.

## 2018-03-05 DIAGNOSIS — I130 Hypertensive heart and chronic kidney disease with heart failure and stage 1 through stage 4 chronic kidney disease, or unspecified chronic kidney disease: Secondary | ICD-10-CM | POA: Diagnosis not present

## 2018-03-05 DIAGNOSIS — M810 Age-related osteoporosis without current pathological fracture: Secondary | ICD-10-CM | POA: Diagnosis not present

## 2018-03-05 DIAGNOSIS — N184 Chronic kidney disease, stage 4 (severe): Secondary | ICD-10-CM | POA: Diagnosis not present

## 2018-03-05 DIAGNOSIS — Z87891 Personal history of nicotine dependence: Secondary | ICD-10-CM | POA: Diagnosis not present

## 2018-03-05 DIAGNOSIS — I872 Venous insufficiency (chronic) (peripheral): Secondary | ICD-10-CM | POA: Diagnosis not present

## 2018-03-05 DIAGNOSIS — L97822 Non-pressure chronic ulcer of other part of left lower leg with fat layer exposed: Secondary | ICD-10-CM | POA: Diagnosis not present

## 2018-03-12 DIAGNOSIS — I130 Hypertensive heart and chronic kidney disease with heart failure and stage 1 through stage 4 chronic kidney disease, or unspecified chronic kidney disease: Secondary | ICD-10-CM | POA: Diagnosis not present

## 2018-03-12 DIAGNOSIS — L97822 Non-pressure chronic ulcer of other part of left lower leg with fat layer exposed: Secondary | ICD-10-CM | POA: Diagnosis not present

## 2018-03-12 DIAGNOSIS — Z87891 Personal history of nicotine dependence: Secondary | ICD-10-CM | POA: Diagnosis not present

## 2018-03-12 DIAGNOSIS — I872 Venous insufficiency (chronic) (peripheral): Secondary | ICD-10-CM | POA: Diagnosis not present

## 2018-03-12 DIAGNOSIS — M810 Age-related osteoporosis without current pathological fracture: Secondary | ICD-10-CM | POA: Diagnosis not present

## 2018-03-12 DIAGNOSIS — N184 Chronic kidney disease, stage 4 (severe): Secondary | ICD-10-CM | POA: Diagnosis not present

## 2018-03-13 ENCOUNTER — Ambulatory Visit (INDEPENDENT_AMBULATORY_CARE_PROVIDER_SITE_OTHER): Payer: Medicare HMO | Admitting: Family Medicine

## 2018-03-13 ENCOUNTER — Encounter: Payer: Self-pay | Admitting: Family Medicine

## 2018-03-13 VITALS — BP 136/80 | HR 73 | Ht 62.0 in | Wt 142.0 lb

## 2018-03-13 DIAGNOSIS — M48061 Spinal stenosis, lumbar region without neurogenic claudication: Secondary | ICD-10-CM | POA: Diagnosis not present

## 2018-03-13 DIAGNOSIS — M7062 Trochanteric bursitis, left hip: Secondary | ICD-10-CM

## 2018-03-13 HISTORY — DX: Spinal stenosis, lumbar region without neurogenic claudication: M48.061

## 2018-03-13 NOTE — Progress Notes (Signed)
Laura Ellis is a 84 y.o. female who presents to [facility] today for left hip pain.  Laura Ellis has a history of chronic low back pain due to spinal stenosis and spondylolisthesis at L4-5 and L5-S1.  She has been managed with this at [facility] with a series of back injections.  Patient cannot recall if there are epidural injections or facet injections or both.  Her last injection was October 16.  She continues to experience intermittent back pain and notes that its worsened recently when she did some lifting.  She denies any injury.  She notes pain is located in the left lower back worse with activity better with rest.  She notes however more significant pain in the left lateral hip.  She notes this is been ongoing for years but worsening over the last few months worsening again recently.  She has pain in the left lateral hip referring down to the lateral thigh.  She denies significant pain radiating below the level of her knee.  She notes this pain is worse when she stands from a seated position and lays on her side.    She has been treating both her back and hip pain with Biofreeze and a back brace, and lidocaine patches.  She notes this helps a bit.  She has not had physical therapy recently for her hip.  She has had physical therapy in the past for her back which was marginally helpful.  She denies any new weakness or numbness new bowel or bladder dysfunction.    ROS:  As above  Exam:  BP 136/80   Pulse 73   Ht 5\' 2"  (1.575 m)   Wt 142 lb (64.4 kg)   BMI 25.97 kg/m  General: Well Developed, well nourished, and in no acute distress.  Neuro/Psych: Alert and oriented x3, extra-ocular muscles intact, able to move all 4 extremities, sensation grossly intact. Skin: Warm and dry, no rashes noted.  Respiratory: Not using accessory muscles, speaking in full sentences, trachea midline.  Cardiovascular: Pulses palpable, no extremity  edema. Abdomen: Does not appear distended. MSK:  L-spine: Nontender to spinal midline.  Tender to palpation left lumbar paraspinal musculature. Lumbar motion is intact with normal flexion normal extension but some pain with extension. Lower extreme strength is intact.  Patient can stand her toes and heels and squat. Reflexes and sensation are equal normal throughout bilateral lower extremities.  Left hip normal-appearing normal motion. Tender to palpation greater trochanter. Hip abduction strength is diminished at 4/5. Resisted hip abduction does reproduce pain in the lateral hip.  Right hip normal-appearing nontender normal motion.    Lab and Radiology Results MRI images and report reviewed independently via CD today. L-spine MRI dated June 03, 2017 reviewed. Agree with findings including significant spinal stenosis and facet DJD with spondylolisthesis and L-spine described above. MRI report will be sent to scan.    Assessment and Plan: 83 y.o. female with  Left hip pain.  I believe the majority of the pain in the left hip is due to hip abductor tendinopathy.  She should have significant benefit with physical therapy and home exercise program.  Plan to recheck in 1 month.    Additionally Laura Ellis has quite a bit of low back pain and has had significant interventions.  Unfortunately at the the time this note was written I did not have access to her medical history fully.  I have requested medical records from [facility].  Patient will  return in 1 month after of had time to review the medical records and I will be able to offer more of an opinion.  She certainly has significant degenerative changes in her lumbar spine that will cause chronic low back pain as well as some radicular pain.    Orders Placed This Encounter  Procedures  . Ambulatory referral to Physical Therapy    Referral Priority:   Routine    Referral Type:   Physical Medicine    Referral Reason:    Specialty Services Required    Requested Specialty:   Physical Therapy    Number of Visits Requested:   1   No orders of the defined types were placed in this encounter.   Historical information moved to improve visibility of documentation.  Past Medical History:  Diagnosis Date  . Arthritis   . Chronic low back pain 05/26/2015  . CKD (chronic kidney disease) stage 4, GFR 15-29 ml/min (HCC) 02/23/2016  . Essential hypertension 05/26/2015  . Hypertension   . Hypothyroidism 07/05/2015  . Osteopenia   . Osteoporosis 05/26/2015   old Dexa reviewed, pt states doesn't want to be on the bisphosphonate therapy but is taking Ca and D    Past Surgical History:  Procedure Laterality Date  . ABDOMINAL HYSTERECTOMY    . ENDOVENOUS ABLATION SAPHENOUS VEIN W/ LASER Right 12/27/2016   endovenous laser ablation right greater saphenous vein by [name] MD  . KNEE ARTHROSCOPY    . VARICOSE VEIN SURGERY     Social History   Tobacco Use  . Smoking status: Former Research scientist (life sciences)  . Smokeless tobacco: Former Systems developer    Quit date: 12/03/2001  Substance Use Topics  . Alcohol use: Yes    Comment: occ   family history is not on file.  Medications: Current Outpatient Medications  Medication Sig Dispense Refill  . amLODipine (NORVASC) 5 MG tablet TAKE ONE TABLET BY MOUTH EVERY DAY 90 tablet 1  . Ascorbic Acid (VITAMIN C) 1000 MG tablet Take 1,000 mg by mouth daily.    . chlorthalidone (HYGROTON) 25 MG tablet TAKE ONE TABLET BY MOUTH EVERY DAY 90 tablet 1  . Coenzyme Q10 (COQ10) 100 MG CAPS Take by mouth daily.    . Cyanocobalamin (VITAMIN B-12 PO) Take 1 tablet by mouth daily.    [name] escitalopram (LEXAPRO) 5 MG tablet Take 1 tablet (5 mg total) by mouth at bedtime. 90 tablet 1  . latanoprost (XALATAN) 0.005 % ophthalmic solution     . losartan (COZAAR) 25 MG tablet Take 25 mg by mouth daily.    . Multiple Vitamin (MULTIVITAMIN WITH MINERALS) TABS tablet Take 1 tablet by mouth daily.    [name] NAPROXEN PO Take 1  tablet by mouth 2 (two) times daily.    . Omega-3 Fatty Acids (FISH OIL PO) Take 1 capsule by mouth daily.    [name] OVER THE COUNTER MEDICATION Take 1 tablet by mouth daily.    . pneumococcal 13-valent conjugate vaccine (PREVNAR 13) SUSP injection     . Red Yeast Rice 600 MG CAPS Take 600 mg by mouth.    . Urea 40 % GEL Apply to abnormal nail/skin as directed 25 mL 1   No current facility-administered medications for this visit.    Allergies  Allergen Reactions  . Statins Other (See Comments)    Muscle aches  . Ace Inhibitors     Cough  . Alendronate Sodium     esophagitis   . Ezetimibe     (  Zetia)  . Fenofibrate     arthralgia   . Other   . Rosuvastatin Calcium   . Rosuvastatin Calcium       Discussed warning signs or symptoms. Please see discharge instructions. Patient expresses understanding.  I personally was present and performed or re-performed the history, physical exam and medical decision-making activities of this service and have verified that the service and findings are accurately documented in the student's note. ___________________________________________ Lynne Leader M.D., ABFM., CAQSM. Primary Care and Sports Medicine Adjunct Instructor of [employer] [employer] [facility]

## 2018-03-13 NOTE — Patient Instructions (Signed)
Thank you for coming in today. Attend Pt.  Do the side leg raises.  30 reps 2-3x daily Do the knee up cross over stretch.   Recheck with me in 4 weeks or sooner if needed.  I will request medical records.   Trochanteric Bursitis Trochanteric bursitis is a condition that causes hip pain. Trochanteric bursitis happens when fluid-filled sacs (bursae) in the hip get irritated. Normally these sacs absorb shock and help strong bands of tissue (tendons) in your hip glide smoothly over each other and over your hip bones. What are the causes? This condition results from increased friction between the hip bones and the tendons that go over them. This condition can happen if you:  Have weak hips.  Use your hip muscles too much (overuse).  Get hit in the hip.  What increases the risk? This condition is more likely to develop in:  Women.  Adults who are middle-aged or older.  People with arthritis or a spinal condition.  People with weak buttocks muscles (gluteal muscles).  People who have one leg that is shorter than the other.  People who participate in certain kinds of athletic activities, such as: ? Running sports, especially long-distance running. ? Contact sports, like football or martial arts. ? Sports in which falls may occur, like skiing.  What are the signs or symptoms? The main symptom of this condition is pain and tenderness over the point of your hip. The pain may be:  Sharp and intense.  Dull and achy.  Felt on the outside of your thigh.  It may increase when you:  Lie on your side.  Walk or run.  Go up on stairs.  Sit.  Stand up after sitting.  Stand for long periods of time.  How is this diagnosed? This condition may be diagnosed based on:  Your symptoms.  Your medical history.  A physical exam.  Imaging tests, such as: ? X-rays to check your bones. ? An MRI or ultrasound to check your tendons and muscles.  During your physical exam, your  health care provider will check the movement and strength of your hip. He or she may press on the point of your hip to check for pain. How is this treated? This condition may be treated by:  Resting.  Reducing your activity.  Avoiding activities that cause pain.  Using crutches, a cane, or a walker to decrease the strain on your hip.  Taking medicine to help with swelling.  Having medicine injected into the bursae to help with swelling.  Using ice, heat, and massage therapy for pain relief.  Physical therapy exercises for strength and flexibility.  Surgery (rare).  Follow these instructions at home: Activity  Rest.  Avoid activities that cause pain.  Return to your normal activities as told by your health care provider. Ask your health care provider what activities are safe for you. Managing pain, stiffness, and swelling  Take over-the-counter and prescription medicines only as told by your health care provider.  If directed, apply heat to the injured area as told by your health care provider. ? Place a towel between your skin and the heat source. ? Leave the heat on for 20-30 minutes. ? Remove the heat if your skin turns bright red. This is especially important if you are unable to feel pain, heat, or cold. You may have a greater risk of getting burned.  If directed, apply ice to the injured area: ? Put ice in a plastic bag. ? Place  a towel between your skin and the bag. ? Leave the ice on for 20 minutes, 2-3 times a day. General instructions  If the affected leg is one that you use for driving, ask your health care provider when it is safe to drive.  Use crutches, a cane, or a walker as told by your health care provider.  If one of your legs is shorter than the other, get fitted for a shoe insert.  Lose weight if you are overweight. How is this prevented?  Wear supportive footwear that is appropriate for your sport.  If you have hip pain, start any new  exercise or sport slowly.  Maintain physical fitness, including: ? Strength. ? Flexibility. Contact a health care provider if:  Your pain does not improve with 2-4 weeks. Get help right away if:  You develop severe pain.  You have a fever.  You develop increased redness over your hip.  You have a change in your bowel function or bladder function.  You cannot control the muscles in your feet. This information is not intended to replace advice given to you by your health care provider. Make sure you discuss any questions you have with your health care provider. Document Released: 06/07/2004 Document Revised: 01/04/2016 Document Reviewed: 04/15/2015 Elsevier Interactive Patient Education  [employer].

## 2018-03-18 ENCOUNTER — Ambulatory Visit: Payer: Medicare HMO | Admitting: Osteopathic Medicine

## 2018-03-19 ENCOUNTER — Encounter (HOSPITAL_BASED_OUTPATIENT_CLINIC_OR_DEPARTMENT_OTHER): Payer: Medicare HMO | Attending: Internal Medicine

## 2018-03-19 DIAGNOSIS — L97822 Non-pressure chronic ulcer of other part of left lower leg with fat layer exposed: Secondary | ICD-10-CM | POA: Insufficient documentation

## 2018-03-19 DIAGNOSIS — I872 Venous insufficiency (chronic) (peripheral): Secondary | ICD-10-CM | POA: Insufficient documentation

## 2018-03-19 DIAGNOSIS — I129 Hypertensive chronic kidney disease with stage 1 through stage 4 chronic kidney disease, or unspecified chronic kidney disease: Secondary | ICD-10-CM | POA: Insufficient documentation

## 2018-03-19 DIAGNOSIS — Z87891 Personal history of nicotine dependence: Secondary | ICD-10-CM | POA: Insufficient documentation

## 2018-03-19 DIAGNOSIS — N184 Chronic kidney disease, stage 4 (severe): Secondary | ICD-10-CM | POA: Insufficient documentation

## 2018-03-20 DIAGNOSIS — M7062 Trochanteric bursitis, left hip: Secondary | ICD-10-CM | POA: Diagnosis not present

## 2018-03-24 ENCOUNTER — Encounter: Payer: Self-pay | Admitting: Osteopathic Medicine

## 2018-03-24 ENCOUNTER — Ambulatory Visit (INDEPENDENT_AMBULATORY_CARE_PROVIDER_SITE_OTHER): Payer: Medicare HMO | Admitting: Osteopathic Medicine

## 2018-03-24 VITALS — BP 131/80 | HR 69 | Temp 97.9°F | Wt 141.7 lb

## 2018-03-24 DIAGNOSIS — E039 Hypothyroidism, unspecified: Secondary | ICD-10-CM | POA: Diagnosis not present

## 2018-03-24 DIAGNOSIS — Z23 Encounter for immunization: Secondary | ICD-10-CM

## 2018-03-24 DIAGNOSIS — I10 Essential (primary) hypertension: Secondary | ICD-10-CM

## 2018-03-24 DIAGNOSIS — G3184 Mild cognitive impairment, so stated: Secondary | ICD-10-CM | POA: Diagnosis not present

## 2018-03-24 DIAGNOSIS — R7989 Other specified abnormal findings of blood chemistry: Secondary | ICD-10-CM | POA: Diagnosis not present

## 2018-03-24 DIAGNOSIS — R413 Other amnesia: Secondary | ICD-10-CM | POA: Diagnosis not present

## 2018-03-24 NOTE — Progress Notes (Signed)
HPI: Laura Ellis is a 84 y.o. female who  has a past medical history of Arthritis, Chronic low back pain (05/26/2015), CKD (chronic kidney disease) stage 4, GFR 15-29 ml/min (HCC) (02/23/2016), Essential hypertension (05/26/2015), Hypertension, Hypothyroidism (07/05/2015), Osteopenia, Osteoporosis (05/26/2015), and Spinal stenosis of lumbar region at multiple levels (03/13/2018).  she presents to [facility] today, 03/24/18,  for chief complaint of:  Memory testing  Patient reports for evaluation of memory problems, concern for cognitive impairment.  She reports that her most bothersome symptom is when she will be having a conversation with someone and then will lose track of the conversation, forget what they were talking about.   MoCA performed:         Past medical history, surgical history, and family history reviewed.  Current medication list and allergy/intolerance information reviewed.   (See remainder of HPI, ROS, Phys Exam below)    ASSESSMENT/PLAN: The primary encounter diagnosis was Memory loss. Diagnoses of Need for influenza vaccination, Mild cognitive impairment, Hypothyroidism, unspecified type, and Essential hypertension were also pertinent to this visit.   Discussed possible options for medication management of mild cognitive impairment, possible early dementia.  Patient has a lot of issues with visuospatial/executive functioning, short-term memory is actually not bad.  She does seem to get distracted, particularly with serial 7 subtraction.  Patient states that she will be participating in some sort of brain study at [facility] early next year and would like to hold off on any imaging or medications until that time.  I think it is reasonable to go ahead and get some labs today to eval for organic causes of memory issues/confusion     Orders Placed This Encounter  Procedures  . Urine Culture  . Flu vaccine HIGH DOSE PF (Fluzone  High dose)  . CBC  . COMPLETE METABOLIC PANEL WITH GFR  . Lipid panel  . TSH  . Urinalysis, Routine w reflex microscopic  . Vitamin B12  . Folate     Follow-up plan: Return for medicare wellness check w/ nurse and same-day appointment w/ Dr Sheppard Coil to monitor medically .         ############################################ ############################################ ############################################ ############################################    Outpatient Encounter Medications as of 03/24/2018  Medication Sig Note  . amLODipine (NORVASC) 5 MG tablet TAKE ONE TABLET BY MOUTH EVERY DAY   . Ascorbic Acid (VITAMIN C) 1000 MG tablet Take 1,000 mg by mouth daily.   . chlorthalidone (HYGROTON) 25 MG tablet TAKE ONE TABLET BY MOUTH EVERY DAY   . Coenzyme Q10 (COQ10) 100 MG CAPS Take by mouth daily.   . Cyanocobalamin (VITAMIN B-12 PO) Take 1 tablet by mouth daily.   [name] escitalopram (LEXAPRO) 5 MG tablet Take 1 tablet (5 mg total) by mouth at bedtime.   [name] latanoprost (XALATAN) 0.005 % ophthalmic solution    . losartan (COZAAR) 25 MG tablet Take 25 mg by mouth daily.   . Multiple Vitamin (MULTIVITAMIN WITH MINERALS) TABS tablet Take 1 tablet by mouth daily.   [name] NAPROXEN PO Take 1 tablet by mouth 2 (two) times daily. 11/19/2013: Patient is not sure of the strength.  . Omega-3 Fatty Acids (FISH OIL PO) Take 1 capsule by mouth daily.   [name] OVER THE COUNTER MEDICATION Take 1 tablet by mouth daily. 11/19/2013: Kosher vitamin.  . pneumococcal 13-valent conjugate vaccine (PREVNAR 13) SUSP injection  06/02/2015: Received from: [facility]  . Red Yeast Rice 600 MG CAPS Take 600 mg by mouth.   [name]  Urea 40 % GEL Apply to abnormal nail/skin as directed    No facility-administered encounter medications on file as of 03/24/2018.    Allergies  Allergen Reactions  . Statins Other (See Comments)    Muscle aches  . Ace Inhibitors     Cough  . Alendronate Sodium     esophagitis     . Ezetimibe     (Zetia)  . Fenofibrate     arthralgia   . Other   . Rosuvastatin Calcium   . Rosuvastatin Calcium       Review of Systems:  Constitutional: No recent illness  HEENT: No  headache, no vision change  Cardiac: No  chest pain, No  pressure, No palpitations  Respiratory:  No  shortness of breath. No  Cough  Neurologic: No  weakness, No  Dizziness  Psychiatric: No  concerns with depression, No  concerns with anxiety  Exam:  BP 131/80 (BP Location: Left Arm, Patient Position: Sitting, Cuff Size: Normal)   Pulse 69   Temp 97.9 F (36.6 C) (Oral)   Wt 141 lb 11.2 oz (64.3 kg)   BMI 25.92 kg/m   Constitutional: VS see above. General Appearance: alert, well-developed, well-nourished, NAD  Eyes: Normal lids and conjunctive, non-icteric sclera  Ears, Nose, Mouth, Throat: MMM, Normal external inspection ears/nares/mouth/lips/gums.  Neck: No masses, trachea midline.   Respiratory: Normal respiratory effort.   Musculoskeletal: Gait normal. Symmetric and independent movement of all extremities  Neurological: Normal balance/coordination. No tremor.  Skin: warm, dry, intact.   Psychiatric: Normal judgment/insight. Normal mood and affect. Oriented x3.   Visit summary with medication list and pertinent instructions was printed for patient to review, advised to alert [place] if any changes needed. All questions at time of visit were answered - patient instructed to contact office with any additional concerns. ER/RTC precautions were reviewed with the patient and understanding verbalized.   Follow-up plan: Return for medicare wellness check w/ nurse and same-day appointment w/ Dr Sheppard Coil to monitor medically .  Note: Total time spent 40 minutes, greater than 50% of the visit was spent face-to-face counseling and coordinating care for the following: The primary encounter diagnosis was Memory loss. Diagnoses of Need for influenza vaccination, Mild cognitive impairment,  Hypothyroidism, unspecified type, and Essential hypertension were also pertinent to this visit.[name]  Please note: voice recognition software was used to produce this document, and typos may escape review. Please contact Dr. Sheppard Coil for any needed clarifications.

## 2018-03-25 ENCOUNTER — Telehealth: Payer: Self-pay | Admitting: Family Medicine

## 2018-03-25 DIAGNOSIS — M7062 Trochanteric bursitis, left hip: Secondary | ICD-10-CM | POA: Diagnosis not present

## 2018-03-25 NOTE — Telephone Encounter (Signed)
Received documentation from per orthopedics.  Patient has multifactorial severe lumbar spinal stenosis at L4-L5.  Patient had left S1 selective nerve root blocks February 26, 2017 without significant benefit. Dr. Rolena Infante recommending against surgery at this time as patient pain is reasonably well controlled.  Lumbar MRI impression  L4-L5 severe canal stenosis with asymmetric right lateral recess stenosis and moderate to severe right foraminal stenosis.  Grade 1 spondylolisthesis at L4 on L5 due to facet arthrosis.  L3-4 mild canal stenosis with asymmetric left lateral recess stenosis.  Grade 1 spinal listhesis of L3 on L4 due to facet arthrosis.  L5-S1 grade 1 spondylolisthesis at L5 on S1 due to facet arthrosis without canal stenosis or neural displacement.

## 2018-03-26 DIAGNOSIS — Z87891 Personal history of nicotine dependence: Secondary | ICD-10-CM | POA: Diagnosis not present

## 2018-03-26 DIAGNOSIS — I872 Venous insufficiency (chronic) (peripheral): Secondary | ICD-10-CM | POA: Diagnosis not present

## 2018-03-26 DIAGNOSIS — N184 Chronic kidney disease, stage 4 (severe): Secondary | ICD-10-CM | POA: Diagnosis not present

## 2018-03-26 DIAGNOSIS — L97822 Non-pressure chronic ulcer of other part of left lower leg with fat layer exposed: Secondary | ICD-10-CM | POA: Diagnosis not present

## 2018-03-26 DIAGNOSIS — I129 Hypertensive chronic kidney disease with stage 1 through stage 4 chronic kidney disease, or unspecified chronic kidney disease: Secondary | ICD-10-CM | POA: Diagnosis not present

## 2018-03-26 LAB — COMPLETE METABOLIC PANEL WITH GFR
AG Ratio: 1.8 (calc) (ref 1.0–2.5)
ALBUMIN MSPROF: 4.3 g/dL (ref 3.6–5.1)
ALT: 15 U/L (ref 6–29)
AST: 26 U/L (ref 10–35)
Alkaline phosphatase (APISO): 69 U/L (ref 33–130)
BUN / CREAT RATIO: 16 (calc) (ref 6–22)
BUN: 23 mg/dL (ref 7–25)
CALCIUM: 9.1 mg/dL (ref 8.6–10.4)
CO2: 27 mmol/L (ref 20–32)
Chloride: 107 mmol/L (ref 98–110)
Creat: 1.44 mg/dL — ABNORMAL HIGH (ref 0.60–0.88)
GFR, EST AFRICAN AMERICAN: 39 mL/min/{1.73_m2} — AB (ref >=60)
GFR, EST NON AFRICAN AMERICAN: 33 mL/min/{1.73_m2} — AB (ref >=60)
GLUCOSE: 68 mg/dL (ref 65–99)
Globulin: 2.4 g/dL (calc) (ref 1.9–3.7)
Potassium: 4.5 mmol/L (ref 3.5–5.3)
Sodium: 141 mmol/L (ref 135–146)
Total Bilirubin: 0.3 mg/dL (ref 0.2–1.2)
Total Protein: 6.7 g/dL (ref 6.1–8.1)

## 2018-03-26 LAB — URINE CULTURE
MICRO NUMBER:: 91357681
RESULT: NO GROWTH
SPECIMEN QUALITY: ADEQUATE

## 2018-03-26 LAB — URINALYSIS, ROUTINE W REFLEX MICROSCOPIC
BACTERIA UA: NONE SEEN /HPF
BILIRUBIN URINE: NEGATIVE
Glucose, UA: NEGATIVE
HYALINE CAST: NONE SEEN /LPF
Hgb urine dipstick: NEGATIVE
KETONES UR: NEGATIVE
NITRITE: NEGATIVE
PH: 5.5 (ref 5.0–8.0)
Protein, ur: NEGATIVE
RBC / HPF: NONE SEEN /HPF (ref 0–2)
SPECIFIC GRAVITY, URINE: 1.014 (ref 1.001–1.03)

## 2018-03-26 LAB — T4, FREE: FREE T4: 1.3 ng/dL (ref 0.8–1.8)

## 2018-03-26 LAB — CBC
HEMATOCRIT: 35.6 % (ref 35.0–45.0)
Hemoglobin: 11.9 g/dL (ref 11.7–15.5)
MCH: 31.3 pg (ref 27.0–33.0)
MCHC: 33.4 g/dL (ref 32.0–36.0)
MCV: 93.7 fL (ref 80.0–100.0)
MPV: 10.1 fL (ref 7.5–12.5)
Platelets: 282 10*3/uL (ref 140–400)
RBC: 3.8 10*6/uL (ref 3.80–5.10)
RDW: 12.2 % (ref 11.0–15.0)
WBC: 7 10*3/uL (ref 3.8–10.8)

## 2018-03-26 LAB — LIPID PANEL
CHOL/HDL RATIO: 2.9 (calc) (ref <5.0)
Cholesterol: 189 mg/dL (ref <200)
HDL: 65 mg/dL (ref >50)
LDL CHOLESTEROL (CALC): 99 mg/dL
NON-HDL CHOLESTEROL (CALC): 124 mg/dL (ref <130)
TRIGLYCERIDES: 146 mg/dL (ref <150)

## 2018-03-26 LAB — VITAMIN B12

## 2018-03-26 LAB — TSH: TSH: 7.23 m[IU]/L — AB (ref 0.40–4.50)

## 2018-03-26 LAB — TEST AUTHORIZATION

## 2018-03-26 LAB — THYROID PEROXIDASE ANTIBODY: Thyroperoxidase Ab SerPl-aCnc: <1 IU/mL (ref <9)

## 2018-03-26 LAB — FOLATE: Folate: >24 ng/mL

## 2018-03-28 DIAGNOSIS — M7062 Trochanteric bursitis, left hip: Secondary | ICD-10-CM | POA: Diagnosis not present

## 2018-03-31 DIAGNOSIS — M7062 Trochanteric bursitis, left hip: Secondary | ICD-10-CM | POA: Diagnosis not present

## 2018-04-04 DIAGNOSIS — M7062 Trochanteric bursitis, left hip: Secondary | ICD-10-CM | POA: Diagnosis not present

## 2018-04-07 DIAGNOSIS — N184 Chronic kidney disease, stage 4 (severe): Secondary | ICD-10-CM | POA: Diagnosis not present

## 2018-04-07 DIAGNOSIS — I129 Hypertensive chronic kidney disease with stage 1 through stage 4 chronic kidney disease, or unspecified chronic kidney disease: Secondary | ICD-10-CM | POA: Diagnosis not present

## 2018-04-07 DIAGNOSIS — M545 Low back pain: Secondary | ICD-10-CM | POA: Diagnosis not present

## 2018-04-07 DIAGNOSIS — G8929 Other chronic pain: Secondary | ICD-10-CM | POA: Diagnosis not present

## 2018-04-08 ENCOUNTER — Ambulatory Visit (INDEPENDENT_AMBULATORY_CARE_PROVIDER_SITE_OTHER): Payer: Medicare HMO | Admitting: Family Medicine

## 2018-04-08 ENCOUNTER — Encounter: Payer: Self-pay | Admitting: Family Medicine

## 2018-04-08 VITALS — BP 159/66 | HR 82 | Wt 140.0 lb

## 2018-04-08 DIAGNOSIS — M7062 Trochanteric bursitis, left hip: Secondary | ICD-10-CM | POA: Diagnosis not present

## 2018-04-08 NOTE — Patient Instructions (Signed)
Thank you for coming in today. Continue Physical Therapy.  Recheck with me as needed.  Let me know if you would like me to extend physical therapy after your course of treatment is over.  Continue home exercise.

## 2018-04-08 NOTE — Progress Notes (Signed)
Laura Ellis is a 84 y.o. female who presents to [facility] today for trochanteric bursitis.  Patient was seen about 3 weeks ago for low back and hip pain thought to be trochanteric bursitis.  She is been doing physical therapy and notes considerable improvement in symptoms.  She continues her home exercise program as well.  She has 3 more weeks of formal physical therapy to go but wonders if she is benefit from intermittent physical therapy after her formal session completes.  She anticipates going about once a month.    ROS:  As above  Exam:  BP (!) 159/66   Pulse 82   Wt 140 lb (63.5 kg)   BMI 25.61 kg/m  General: Well Developed, well nourished, and in no acute distress.  Neuro/Psych: Alert and oriented x3, extra-ocular muscles intact, able to move all 4 extremities, sensation grossly intact. Skin: Warm and dry, no rashes noted.  Respiratory: Not using accessory muscles, speaking in full sentences, trachea midline.  Cardiovascular: Pulses palpable, no extremity edema. Abdomen: Does not appear distended. MSK: Mild antalgic gait significantly improved from prior visit.  Lower extremity strength is intact.    Lab and Radiology Results No results found for this or any previous visit (from the past 72 hour(s)). No results found.     Assessment and Plan: 84 y.o. female with improving left trochanteric bursitis.  Continue physical therapy and home exercise program.  Consider ongoing intermittent mild physical therapy along with home exercise program in the future.  Recheck with me as needed.  I spent 15 minutes with this patient, greater than 50% was face-to-face time counseling regarding diagnosis and plan.   No orders of the defined types were placed in this encounter.  No orders of the defined types were placed in this encounter.   Historical information moved to improve visibility of documentation.  Past Medical History:    Diagnosis Date  . Arthritis   . Chronic low back pain 05/26/2015  . CKD (chronic kidney disease) stage 4, GFR 15-29 ml/min (HCC) 02/23/2016  . Essential hypertension 05/26/2015  . Hypertension   . Hypothyroidism 07/05/2015  . Osteopenia   . Osteoporosis 05/26/2015   old Dexa reviewed, pt states doesn't want to be on the bisphosphonate therapy but is taking Ca and D   . Spinal stenosis of lumbar region at multiple levels 03/13/2018   MRI [facility] orthopedics January 2019   Past Surgical History:  Procedure Laterality Date  . ABDOMINAL HYSTERECTOMY    . ENDOVENOUS ABLATION SAPHENOUS VEIN W/ LASER Right 12/27/2016   endovenous laser ablation right greater saphenous vein by [name] MD  . KNEE ARTHROSCOPY    . VARICOSE VEIN SURGERY     Social History   Tobacco Use  . Smoking status: Former Research scientist (life sciences)  . Smokeless tobacco: Former Systems developer    Quit date: 12/03/2001  Substance Use Topics  . Alcohol use: Yes    Comment: occ   family history is not on file.  Medications: Current Outpatient Medications  Medication Sig Dispense Refill  . amLODipine (NORVASC) 5 MG tablet TAKE ONE TABLET BY MOUTH EVERY DAY 90 tablet 1  . Ascorbic Acid (VITAMIN C) 1000 MG tablet Take 1,000 mg by mouth daily.    . chlorthalidone (HYGROTON) 25 MG tablet TAKE ONE TABLET BY MOUTH EVERY DAY 90 tablet 1  . Coenzyme Q10 (COQ10) 100 MG CAPS Take by mouth daily.    . Cyanocobalamin (VITAMIN B-12 PO) Take 1  tablet by mouth daily.    [name] escitalopram (LEXAPRO) 5 MG tablet Take 1 tablet (5 mg total) by mouth at bedtime. 90 tablet 1  . latanoprost (XALATAN) 0.005 % ophthalmic solution     . losartan (COZAAR) 25 MG tablet Take 25 mg by mouth daily.    . Multiple Vitamin (MULTIVITAMIN WITH MINERALS) TABS tablet Take 1 tablet by mouth daily.    [name] NAPROXEN PO Take 1 tablet by mouth 2 (two) times daily.    . Omega-3 Fatty Acids (FISH OIL PO) Take 1 capsule by mouth daily.    [name] OVER THE COUNTER MEDICATION Take 1 tablet by mouth  daily.    . pneumococcal 13-valent conjugate vaccine (PREVNAR 13) SUSP injection     . Red Yeast Rice 600 MG CAPS Take 600 mg by mouth.    . Urea 40 % GEL Apply to abnormal nail/skin as directed 25 mL 1   No current facility-administered medications for this visit.    Allergies  Allergen Reactions  . Statins Other (See Comments)    Muscle aches  . Ace Inhibitors     Cough  . Alendronate Sodium     esophagitis   . Ezetimibe     (Zetia)  . Fenofibrate     arthralgia   . Other   . Rosuvastatin Calcium   . Rosuvastatin Calcium       Discussed warning signs or symptoms. Please see discharge instructions. Patient expresses understanding.

## 2018-04-14 DIAGNOSIS — M7062 Trochanteric bursitis, left hip: Secondary | ICD-10-CM | POA: Diagnosis not present

## 2018-04-16 ENCOUNTER — Encounter (HOSPITAL_BASED_OUTPATIENT_CLINIC_OR_DEPARTMENT_OTHER): Payer: Medicare HMO | Attending: Internal Medicine

## 2018-04-16 DIAGNOSIS — N184 Chronic kidney disease, stage 4 (severe): Secondary | ICD-10-CM | POA: Diagnosis not present

## 2018-04-16 DIAGNOSIS — I129 Hypertensive chronic kidney disease with stage 1 through stage 4 chronic kidney disease, or unspecified chronic kidney disease: Secondary | ICD-10-CM | POA: Diagnosis not present

## 2018-04-16 DIAGNOSIS — Z87891 Personal history of nicotine dependence: Secondary | ICD-10-CM | POA: Insufficient documentation

## 2018-04-16 DIAGNOSIS — L97822 Non-pressure chronic ulcer of other part of left lower leg with fat layer exposed: Secondary | ICD-10-CM | POA: Insufficient documentation

## 2018-04-16 DIAGNOSIS — I872 Venous insufficiency (chronic) (peripheral): Secondary | ICD-10-CM | POA: Insufficient documentation

## 2018-04-21 ENCOUNTER — Ambulatory Visit: Payer: Medicare HMO

## 2018-04-21 ENCOUNTER — Other Ambulatory Visit: Payer: Self-pay | Admitting: Osteopathic Medicine

## 2018-04-23 DIAGNOSIS — I129 Hypertensive chronic kidney disease with stage 1 through stage 4 chronic kidney disease, or unspecified chronic kidney disease: Secondary | ICD-10-CM | POA: Diagnosis not present

## 2018-04-23 DIAGNOSIS — N184 Chronic kidney disease, stage 4 (severe): Secondary | ICD-10-CM | POA: Diagnosis not present

## 2018-04-23 DIAGNOSIS — L97822 Non-pressure chronic ulcer of other part of left lower leg with fat layer exposed: Secondary | ICD-10-CM | POA: Diagnosis not present

## 2018-04-23 DIAGNOSIS — Z87891 Personal history of nicotine dependence: Secondary | ICD-10-CM | POA: Diagnosis not present

## 2018-04-23 DIAGNOSIS — I872 Venous insufficiency (chronic) (peripheral): Secondary | ICD-10-CM | POA: Diagnosis not present

## 2018-04-30 DIAGNOSIS — N184 Chronic kidney disease, stage 4 (severe): Secondary | ICD-10-CM | POA: Diagnosis not present

## 2018-04-30 DIAGNOSIS — I872 Venous insufficiency (chronic) (peripheral): Secondary | ICD-10-CM | POA: Diagnosis not present

## 2018-04-30 DIAGNOSIS — Z87891 Personal history of nicotine dependence: Secondary | ICD-10-CM | POA: Diagnosis not present

## 2018-04-30 DIAGNOSIS — L97822 Non-pressure chronic ulcer of other part of left lower leg with fat layer exposed: Secondary | ICD-10-CM | POA: Diagnosis not present

## 2018-04-30 DIAGNOSIS — I129 Hypertensive chronic kidney disease with stage 1 through stage 4 chronic kidney disease, or unspecified chronic kidney disease: Secondary | ICD-10-CM | POA: Diagnosis not present

## 2018-05-21 ENCOUNTER — Encounter (HOSPITAL_BASED_OUTPATIENT_CLINIC_OR_DEPARTMENT_OTHER): Payer: Medicare HMO | Attending: Physician Assistant

## 2018-05-21 DIAGNOSIS — L97822 Non-pressure chronic ulcer of other part of left lower leg with fat layer exposed: Secondary | ICD-10-CM | POA: Insufficient documentation

## 2018-05-21 DIAGNOSIS — M810 Age-related osteoporosis without current pathological fracture: Secondary | ICD-10-CM | POA: Insufficient documentation

## 2018-05-21 DIAGNOSIS — N184 Chronic kidney disease, stage 4 (severe): Secondary | ICD-10-CM | POA: Diagnosis not present

## 2018-05-21 DIAGNOSIS — I872 Venous insufficiency (chronic) (peripheral): Secondary | ICD-10-CM | POA: Diagnosis not present

## 2018-05-21 DIAGNOSIS — Z87891 Personal history of nicotine dependence: Secondary | ICD-10-CM | POA: Insufficient documentation

## 2018-05-21 DIAGNOSIS — I129 Hypertensive chronic kidney disease with stage 1 through stage 4 chronic kidney disease, or unspecified chronic kidney disease: Secondary | ICD-10-CM | POA: Insufficient documentation

## 2018-05-28 DIAGNOSIS — Z87891 Personal history of nicotine dependence: Secondary | ICD-10-CM | POA: Diagnosis not present

## 2018-05-28 DIAGNOSIS — N184 Chronic kidney disease, stage 4 (severe): Secondary | ICD-10-CM | POA: Diagnosis not present

## 2018-05-28 DIAGNOSIS — I129 Hypertensive chronic kidney disease with stage 1 through stage 4 chronic kidney disease, or unspecified chronic kidney disease: Secondary | ICD-10-CM | POA: Diagnosis not present

## 2018-05-28 DIAGNOSIS — I872 Venous insufficiency (chronic) (peripheral): Secondary | ICD-10-CM | POA: Diagnosis not present

## 2018-05-28 DIAGNOSIS — L97822 Non-pressure chronic ulcer of other part of left lower leg with fat layer exposed: Secondary | ICD-10-CM | POA: Diagnosis not present

## 2018-05-28 DIAGNOSIS — M810 Age-related osteoporosis without current pathological fracture: Secondary | ICD-10-CM | POA: Diagnosis not present

## 2018-06-04 DIAGNOSIS — Z87891 Personal history of nicotine dependence: Secondary | ICD-10-CM | POA: Diagnosis not present

## 2018-06-04 DIAGNOSIS — I129 Hypertensive chronic kidney disease with stage 1 through stage 4 chronic kidney disease, or unspecified chronic kidney disease: Secondary | ICD-10-CM | POA: Diagnosis not present

## 2018-06-04 DIAGNOSIS — M810 Age-related osteoporosis without current pathological fracture: Secondary | ICD-10-CM | POA: Diagnosis not present

## 2018-06-04 DIAGNOSIS — I872 Venous insufficiency (chronic) (peripheral): Secondary | ICD-10-CM | POA: Diagnosis not present

## 2018-06-04 DIAGNOSIS — L97822 Non-pressure chronic ulcer of other part of left lower leg with fat layer exposed: Secondary | ICD-10-CM | POA: Diagnosis not present

## 2018-06-04 DIAGNOSIS — N184 Chronic kidney disease, stage 4 (severe): Secondary | ICD-10-CM | POA: Diagnosis not present

## 2018-06-11 DIAGNOSIS — L97822 Non-pressure chronic ulcer of other part of left lower leg with fat layer exposed: Secondary | ICD-10-CM | POA: Diagnosis not present

## 2018-06-11 DIAGNOSIS — I129 Hypertensive chronic kidney disease with stage 1 through stage 4 chronic kidney disease, or unspecified chronic kidney disease: Secondary | ICD-10-CM | POA: Diagnosis not present

## 2018-06-11 DIAGNOSIS — Z87891 Personal history of nicotine dependence: Secondary | ICD-10-CM | POA: Diagnosis not present

## 2018-06-11 DIAGNOSIS — M810 Age-related osteoporosis without current pathological fracture: Secondary | ICD-10-CM | POA: Diagnosis not present

## 2018-06-11 DIAGNOSIS — I872 Venous insufficiency (chronic) (peripheral): Secondary | ICD-10-CM | POA: Diagnosis not present

## 2018-06-11 DIAGNOSIS — L97829 Non-pressure chronic ulcer of other part of left lower leg with unspecified severity: Secondary | ICD-10-CM | POA: Diagnosis not present

## 2018-06-11 DIAGNOSIS — N184 Chronic kidney disease, stage 4 (severe): Secondary | ICD-10-CM | POA: Diagnosis not present

## 2018-06-18 ENCOUNTER — Encounter (HOSPITAL_BASED_OUTPATIENT_CLINIC_OR_DEPARTMENT_OTHER): Payer: Medicare HMO | Attending: Internal Medicine

## 2018-06-18 DIAGNOSIS — I130 Hypertensive heart and chronic kidney disease with heart failure and stage 1 through stage 4 chronic kidney disease, or unspecified chronic kidney disease: Secondary | ICD-10-CM | POA: Diagnosis not present

## 2018-06-18 DIAGNOSIS — N184 Chronic kidney disease, stage 4 (severe): Secondary | ICD-10-CM | POA: Diagnosis not present

## 2018-06-18 DIAGNOSIS — I872 Venous insufficiency (chronic) (peripheral): Secondary | ICD-10-CM | POA: Diagnosis not present

## 2018-06-18 DIAGNOSIS — Z87891 Personal history of nicotine dependence: Secondary | ICD-10-CM | POA: Insufficient documentation

## 2018-06-18 DIAGNOSIS — I509 Heart failure, unspecified: Secondary | ICD-10-CM | POA: Diagnosis not present

## 2018-06-18 DIAGNOSIS — L97822 Non-pressure chronic ulcer of other part of left lower leg with fat layer exposed: Secondary | ICD-10-CM | POA: Insufficient documentation

## 2018-06-25 DIAGNOSIS — N184 Chronic kidney disease, stage 4 (severe): Secondary | ICD-10-CM | POA: Diagnosis not present

## 2018-06-25 DIAGNOSIS — Z87891 Personal history of nicotine dependence: Secondary | ICD-10-CM | POA: Diagnosis not present

## 2018-06-25 DIAGNOSIS — L97822 Non-pressure chronic ulcer of other part of left lower leg with fat layer exposed: Secondary | ICD-10-CM | POA: Diagnosis not present

## 2018-06-25 DIAGNOSIS — I872 Venous insufficiency (chronic) (peripheral): Secondary | ICD-10-CM | POA: Diagnosis not present

## 2018-06-25 DIAGNOSIS — I130 Hypertensive heart and chronic kidney disease with heart failure and stage 1 through stage 4 chronic kidney disease, or unspecified chronic kidney disease: Secondary | ICD-10-CM | POA: Diagnosis not present

## 2018-06-25 DIAGNOSIS — I509 Heart failure, unspecified: Secondary | ICD-10-CM | POA: Diagnosis not present

## 2018-07-01 ENCOUNTER — Ambulatory Visit: Payer: Medicare HMO

## 2018-07-01 ENCOUNTER — Ambulatory Visit: Payer: Medicare HMO | Admitting: Osteopathic Medicine

## 2018-07-23 ENCOUNTER — Encounter (HOSPITAL_BASED_OUTPATIENT_CLINIC_OR_DEPARTMENT_OTHER): Payer: Medicare HMO | Attending: Internal Medicine

## 2018-08-25 ENCOUNTER — Ambulatory Visit: Payer: Medicare HMO

## 2018-08-25 NOTE — Progress Notes (Unsigned)
Subjective:   Laura Ellis is a 85 y.o. female who presents for an Initial Medicare Annual Wellness Visit.  Review of Systems    No ROS.  Medicare Wellness Visit. Additional risk factors are reflected in the social history.     Sleep patterns:   Home Safety/Smoke Alarms: Feels safe in home. Smoke alarms in place.  Living environment; Seat Belt Safety/Bike Helmet: Wears seat belt.   Female:   Pap-  Aged out     Mammo-  Aged out     Dexa scan-        CCS- aged out     Objective:    There were no vitals filed for this visit. There is no height or weight on file to calculate BMI.  Advanced Directives 12/10/2017 08/15/2016  Does Patient Have a Medical Advance Directive? Yes Yes  Type of Paramedic of [place];Living will [place];Living will  Copy of [place] in Chart? No - copy requested -    Current Medications (verified) Outpatient Encounter Medications as of 08/26/2018  Medication Sig  . amLODipine (NORVASC) 5 MG tablet TAKE ONE TABLET BY MOUTH EVERY DAY  . Ascorbic Acid (VITAMIN C) 1000 MG tablet Take 1,000 mg by mouth daily.  . chlorthalidone (HYGROTON) 25 MG tablet TAKE ONE TABLET BY MOUTH EVERY DAY  . Coenzyme Q10 (COQ10) 100 MG CAPS Take by mouth daily.  . Cyanocobalamin (VITAMIN B-12 PO) Take 1 tablet by mouth daily.  [name] escitalopram (LEXAPRO) 5 MG tablet Take 1 tablet (5 mg total) by mouth at bedtime.  [name] latanoprost (XALATAN) 0.005 % ophthalmic solution   . losartan (COZAAR) 25 MG tablet Take 25 mg by mouth daily.  . Multiple Vitamin (MULTIVITAMIN WITH MINERALS) TABS tablet Take 1 tablet by mouth daily.  [name] NAPROXEN PO Take 1 tablet by mouth 2 (two) times daily.  . Omega-3 Fatty Acids (FISH OIL PO) Take 1 capsule by mouth daily.  [name] OVER THE COUNTER MEDICATION Take 1 tablet by mouth daily.  . pneumococcal 13-valent conjugate vaccine (PREVNAR 13) SUSP injection   . Red Yeast Rice 600 MG CAPS Take 600  mg by mouth.  . Urea 40 % GEL Apply to abnormal nail/skin as directed   No facility-administered encounter medications on file as of 08/26/2018.     Allergies (verified) Statins; Ace inhibitors; Alendronate sodium; Ezetimibe; Fenofibrate; Other; Rosuvastatin calcium; and Rosuvastatin calcium   History: Past Medical History:  Diagnosis Date  . Arthritis   . Chronic low back pain 05/26/2015  . CKD (chronic kidney disease) stage 4, GFR 15-29 ml/min (HCC) 02/23/2016  . Essential hypertension 05/26/2015  . Hypertension   . Hypothyroidism 07/05/2015  . Osteopenia   . Osteoporosis 05/26/2015   old Dexa reviewed, pt states doesn't want to be on the bisphosphonate therapy but is taking Ca and D   . Spinal stenosis of lumbar region at multiple levels 03/13/2018   MRI [facility] orthopedics January 2019   Past Surgical History:  Procedure Laterality Date  . ABDOMINAL HYSTERECTOMY    . ENDOVENOUS ABLATION SAPHENOUS VEIN W/ LASER Right 12/27/2016   endovenous laser ablation right greater saphenous vein by [name] MD  . KNEE ARTHROSCOPY    . VARICOSE VEIN SURGERY     No family history on file. Social History   Socioeconomic History  . Marital status: Widowed    Spouse name: Not on file  . Number of children: Not on file  . Years  of education: Not on file  . Highest education level: Not on file  Occupational History  . Not on file  Social Needs  . Financial resource strain: Not on file  . Food insecurity:    Worry: Not on file    Inability: Not on file  . Transportation needs:    Medical: Not on file    Non-medical: Not on file  Tobacco Use  . Smoking status: Former Research scientist (life sciences)  . Smokeless tobacco: Former Systems developer    Quit date: 12/03/2001  Substance and Sexual Activity  . Alcohol use: Yes    Comment: occ  . Drug use: No  . Sexual activity: Not on file  Lifestyle  . Physical activity:    Days per week: Not on file    Minutes per session: Not on file  . Stress: Not on file   Relationships  . Social connections:    Talks on phone: Not on file    Gets together: Not on file    Attends religious service: Not on file    Active member of club or organization: Not on file    Attends meetings of clubs or organizations: Not on file    Relationship status: Not on file  Other Topics Concern  . Not on file  Social History Narrative  . Not on file    Tobacco Counseling Counseling given: Not Answered   Clinical Intake:                        Activities of Daily Living No flowsheet data found.   Immunizations and Health Maintenance Immunization History  Administered Date(s) Administered  . Influenza Split 03/02/2009, 02/06/2010, 01/31/2011  . Influenza, High Dose Seasonal PF 03/07/2017, 03/24/2018  . Influenza, Seasonal, Injecte, Preservative Fre 02/10/2014  . Influenza,inj,Quad PF,6+ Mos 06/02/2015, 02/23/2016  . Influenza,inj,quad, With Preservative 05/14/2016  . Influenza-Unspecified 02/13/2012  . Pneumococcal Conjugate-13 03/24/2014  . Pneumococcal Polysaccharide-23 02/13/2012  . Td 04/26/2009  . Tdap 11/19/2013  . Zoster 03/25/2009   There are no preventive care reminders to display for this patient.     Patient Care Team: Emeterio Reeve, DO as PCP - General (Osteopathic Medicine)  Indicate any recent Medical Services you may have received from other than [facility] providers in the past year (date may be approximate).     Assessment:   This is a routine wellness examination for [place].Physical assessment deferred to PCP.   Hearing/Vision screen No exam data present  Dietary issues and exercise activities discussed:   Diet  Breakfast: Lunch:  Dinner:       Goals   None    Depression Screen PHQ 2/9 Scores 01/16/2018 07/18/2017 09/17/2016 06/02/2015  PHQ - 2 Score 1 0 1 0  PHQ- 9 Score 4 2 - -    Fall Risk Fall Risk  03/07/2017 06/02/2015  Falls in the past year? No No    Is the patient's home free of loose throw  rugs in walkways, pet beds, electrical cords, etc?   {Blank single:19197::"yes","no"}      Grab bars in the bathroom? {Blank single:19197::"yes","no"}      Handrails on the stairs?   {Blank single:19197::"yes","no"}      Adequate lighting?   {Blank single:19197::"yes","no"}  Cognitive Function:        Screening Tests Health Maintenance  Topic Date Due  . INFLUENZA VACCINE  12/13/2018  . TETANUS/TDAP  11/20/2023  . PNA vac Low Risk Adult  Completed  . DEXA  SCAN  Addressed        Plan:   ***  I have personally reviewed and noted the following in the patient's chart:   . Medical and social history . Use of alcohol, tobacco or illicit drugs  . Current medications and supplements . Functional ability and status . Nutritional status . Physical activity . Advanced directives . List of other physicians . Hospitalizations, surgeries, and ER visits in previous 12 months . Vitals . Screenings to include cognitive, depression, and falls . Referrals and appointments  In addition, I have reviewed and discussed with patient certain preventive protocols, quality metrics, and best practice recommendations. A written personalized care plan for preventive services as well as general preventive health recommendations were provided to patient.     Joanne Chars, LPN   7/74/1423

## 2018-08-26 ENCOUNTER — Ambulatory Visit: Payer: Medicare HMO

## 2018-08-31 NOTE — Progress Notes (Signed)
Subjective:   Laura Ellis is a 85 y.o. female who presents for an Initial Medicare Annual Wellness Visit.  Review of Systems    No ROS.  Medicare Wellness Visit. Additional risk factors are reflected in the social history.   Cardiac Risk Factors include: advanced age (>65men, >66 women);hypertension Sleep patterns: Getting  On average 8 hours of sleep a day. Will nap in the afternoon. Wakes up[ occasionally to go to the bathroom. Wakes up in the morning feeling rested.   Home Safety/Smoke Alarms: Feels safe in home. Smoke alarms in place.  Living environment; Lives in a 2 story home with daughter. Hand rails are on the steps. SHower is a walk in shower and no grab bars in place. Tub does have hand rails. Seat Belt Safety/Bike Helmet: Wears seat belt.   Female:   Pap-  Aged out     Mammo-   Aged out    Dexa scan- offered to schedule patient declined       CCS- aged out     Objective:    Today's Vitals   09/01/18 1005  BP: 128/68  Pulse: 81  Weight: 147 lb (66.7 kg)  Height: 5\' 1"  (1.549 m)  PainSc: 3    Body mass index is 27.78 kg/m.  Advanced Directives 09/01/2018 12/10/2017 08/15/2016  Does Patient Have a Medical Advance Directive? No Yes Yes  Type of Advance Directive - [place];Living will [place];Living will  Copy of [place] in Chart? - No - copy requested -  Would patient like information on creating a medical advance directive? No - Patient declined - -    Current Medications (verified) Outpatient Encounter Medications as of 09/01/2018  Medication Sig  . amLODipine (NORVASC) 5 MG tablet TAKE ONE TABLET BY MOUTH EVERY DAY  . Ascorbic Acid (VITAMIN C) 1000 MG tablet Take 1,000 mg by mouth daily.  . Cyanocobalamin (VITAMIN B-12 PO) Take 1 tablet by mouth daily.  [name] latanoprost (XALATAN) 0.005 % ophthalmic solution   . losartan (COZAAR) 25 MG tablet Take 25 mg by mouth daily.  . Multiple Vitamin  (MULTIVITAMIN WITH MINERALS) TABS tablet Take 1 tablet by mouth daily.  [name] NAPROXEN PO Take 1 tablet by mouth 2 (two) times daily.  . Omega-3 Fatty Acids (FISH OIL PO) Take 1 capsule by mouth daily.  [name] OVER THE COUNTER MEDICATION Take 1 tablet by mouth daily.  . Red Yeast Rice 600 MG CAPS Take 600 mg by mouth.  . Urea 40 % GEL Apply to abnormal nail/skin as directed  . chlorthalidone (HYGROTON) 25 MG tablet TAKE ONE TABLET BY MOUTH EVERY DAY (Patient not taking: Reported on 09/01/2018)  . Coenzyme Q10 (COQ10) 100 MG CAPS Take by mouth daily.  [name] escitalopram (LEXAPRO) 5 MG tablet Take 1 tablet (5 mg total) by mouth at bedtime. (Patient not taking: Reported on 09/01/2018)  . pneumococcal 13-valent conjugate vaccine (PREVNAR 13) SUSP injection    No facility-administered encounter medications on file as of 09/01/2018.     Allergies (verified) Statins; Ace inhibitors; Alendronate sodium; Ezetimibe; Fenofibrate; Other; Rosuvastatin calcium; and Rosuvastatin calcium   History: Past Medical History:  Diagnosis Date  . Arthritis   . Chronic low back pain 05/26/2015  . CKD (chronic kidney disease) stage 4, GFR 15-29 ml/min (HCC) 02/23/2016  . Essential hypertension 05/26/2015  . Hypertension   . Hypothyroidism 07/05/2015  . Osteopenia   . Osteoporosis 05/26/2015   old Dexa reviewed, pt  states doesn't want to be on the bisphosphonate therapy but is taking Ca and D   . Spinal stenosis of lumbar region at multiple levels 03/13/2018   MRI [facility] orthopedics January 2019   Past Surgical History:  Procedure Laterality Date  . ABDOMINAL HYSTERECTOMY    . ENDOVENOUS ABLATION SAPHENOUS VEIN W/ LASER Right 12/27/2016   endovenous laser ablation right greater saphenous vein by [name] MD  . KNEE ARTHROSCOPY    . VARICOSE VEIN SURGERY     Family History  Problem Relation Age of Onset  . Stroke Mother   . Cancer Father   . Heart disease Sister   . Hypertension Sister    Social History    Socioeconomic History  . Marital status: Widowed    Spouse name: Not on file  . Number of children: 3  . Years of education: 63  . Highest education level: Some college, no degree  Occupational History  . Occupation: Engineer, site    Comment: retired  Scientific laboratory technician  . Financial resource strain: Not hard at all  . Food insecurity:    Worry: Never true    Inability: Never true  . Transportation needs:    Medical: No    Non-medical: No  Tobacco Use  . Smoking status: Former Research scientist (life sciences)  . Smokeless tobacco: Former Systems developer    Quit date: 12/03/2001  Substance and Sexual Activity  . Alcohol use: Yes    Alcohol/week: 1.0 standard drinks    Types: 1 Glasses of wine per week    Comment: occasionally  . Drug use: No  . Sexual activity: Not Currently  Lifestyle  . Physical activity:    Days per week: 0 days    Minutes per session: 0 min  . Stress: Not at all  Relationships  . Social connections:    Talks on phone: Once a week    Gets together: Never    Attends religious service: Never    Active member of club or organization: No    Attends meetings of clubs or organizations: Never    Relationship status: Widowed  Other Topics Concern  . Not on file  Social History Narrative   Feeds all her animals- cats- rescue   Chores around the house   Watches TV   Coffee 2 cups in the am    Tobacco Counseling Counseling given: Not Answered   Clinical Intake:  Pre-visit preparation completed: Yes  Pain : 0-10 Pain Score: 3  Pain Type: Chronic pain Pain Location: Back Pain Orientation: Left Pain Radiating Towards: left flank and hip and leg Pain Descriptors / Indicators: Constant, Throbbing, Radiating Pain Onset: More than a month ago Pain Frequency: Constant Pain Relieving Factors: back brace helps Effect of Pain on Daily Activities: if no back brace effects daily activities  Pain Relieving Factors: back brace helps  Nutritional Risks: None Diabetes: No  How often do  you need to have someone help you when you read instructions, pamphlets, or other written materials from your doctor or pharmacy?: 1 - Never What is the last grade level you completed in school?: 14     Information entered by :: Orlie Dakin, LPN   Activities of Daily Living In your present state of health, do you have any difficulty performing the following activities: 09/01/2018  Hearing? Y  Comment seen hearing doctor and right ear was suggested to have a hearing aid  Vision? N  Comment uses her drops every day as directed for the glaucoma  Difficulty concentrating or making decisions? Y  Comment patient states from time to time she does- not everyday  Walking or climbing stairs? N  Dressing or bathing? N  Doing errands, shopping? N  Preparing Food and eating ? N  Using the Toilet? N  In the past six months, have you accidently leaked urine? N  Do you have problems with loss of bowel control? N  Managing your Medications? N  Managing your Finances? N  Housekeeping or managing your Housekeeping? N  Some recent data might be hidden     Immunizations and Health Maintenance Immunization History  Administered Date(s) Administered  . Influenza Split 03/02/2009, 02/06/2010, 01/31/2011  . Influenza, High Dose Seasonal PF 03/07/2017, 03/24/2018  . Influenza, Seasonal, Injecte, Preservative Fre 02/10/2014  . Influenza,inj,Quad PF,6+ Mos 06/02/2015, 02/23/2016  . Influenza,inj,quad, With Preservative 05/14/2016  . Influenza-Unspecified 02/13/2012  . Pneumococcal Conjugate-13 03/24/2014  . Pneumococcal Polysaccharide-23 02/13/2012  . Td 04/26/2009  . Tdap 11/19/2013  . Zoster 03/25/2009   There are no preventive care reminders to display for this patient.  Patient Care Team: Emeterio Reeve, DO as PCP - General (Osteopathic Medicine)  Indicate any recent Medical Services you may have received from other than [facility] providers in the past year (date may be approximate).      Assessment:   This is a routine wellness examination for [place].Physical assessment deferred to PCP.   Hearing/Vision screen Hearing Screening Comments: Hearing test not done- visit done over the telephone due to the [place] pandemic. Vision Screening Comments: Vision test not done- visit done over the telephone due to the [place] pandemic  Dietary issues and exercise activities discussed: Current Exercise Habits: The patient does not participate in regular exercise at present, Exercise limited by: orthopedic condition(s) DietEats a pretty healthy diet full of vegetables, beans rice. Breakfast: bran flakes, oatmeal, belvita Lunch: cracker with peanut butter Dinner: Meat and vegetables, baked potato, salads, spaghetti and fruit. Drinks water daily      Goals    . Patient Stated     Patient stated would like to have back feeling better. Not a candidate for back surgery but is thinking about the stem cell.      Depression Screen PHQ 2/9 Scores 09/01/2018 01/16/2018 07/18/2017 09/17/2016 06/02/2015  PHQ - 2 Score 0 1 0 1 0  PHQ- 9 Score - 4 2 - -    Fall Risk Fall Risk  09/01/2018 03/07/2017 06/02/2015  Falls in the past year? 0 No No  Follow up Falls prevention discussed - -    Is the patient's home free of loose throw rugs in walkways, pet beds, electrical cords, etc?   yes      Grab bars in the bathroom? yes      Handrails on the stairs?   yes      Adequate lighting?   yes  Cognitive Function:     6CIT Screen 09/01/2018  What Year? 0 points  What month? 0 points  What time? 0 points  Count back from 20 0 points  Months in reverse 2 points  Repeat phrase 0 points  Total Score 2    Screening Tests Health Maintenance  Topic Date Due  . INFLUENZA VACCINE  12/13/2018  . TETANUS/TDAP  11/20/2023  . PNA vac Low Risk Adult  Completed  . DEXA SCAN  Addressed        Plan:      Ms. Depaoli , Thank you for taking time to come for your Medicare  Wellness Visit. I appreciate  your ongoing commitment to your health goals. Please review the following plan we discussed and let me know if I can assist you in the future.   Please schedule your next medicare wellness visit with me in 1 yr. Continue doing brain stimulating activities (puzzles, reading, adult coloring books, staying active) to keep memory sharp.    These are the goals we discussed: Goals    . Patient Stated     Patient stated would like to have back feeling better. Not a candidate for back surgery but is thinking about the stem cell.       This is a list of the screening recommended for you and due dates:  Health Maintenance  Topic Date Due  . Flu Shot  12/13/2018  . Tetanus Vaccine  11/20/2023  . Pneumonia vaccines  Completed  . DEXA scan (bone density measurement)  Addressed     I have personally reviewed and noted the following in the patient's chart:   . Medical and social history . Use of alcohol, tobacco or illicit drugs  . Current medications and supplements . Functional ability and status . Nutritional status . Physical activity . Advanced directives . List of other physicians . Hospitalizations, surgeries, and ER visits in previous 12 months . Vitals . Screenings to include cognitive, depression, and falls . Referrals and appointments  In addition, I have reviewed and discussed with patient certain preventive protocols, quality metrics, and best practice recommendations. A written personalized care plan for preventive services as well as general preventive health recommendations were provided to patient.     Joanne Chars, LPN   8/67/6720

## 2018-09-01 ENCOUNTER — Ambulatory Visit (INDEPENDENT_AMBULATORY_CARE_PROVIDER_SITE_OTHER): Payer: Medicare HMO | Admitting: *Deleted

## 2018-09-01 VITALS — BP 128/68 | HR 81 | Ht 61.0 in | Wt 147.0 lb

## 2018-09-01 DIAGNOSIS — Z0000 Encounter for general adult medical examination without abnormal findings: Secondary | ICD-10-CM

## 2018-09-01 DIAGNOSIS — M810 Age-related osteoporosis without current pathological fracture: Secondary | ICD-10-CM | POA: Diagnosis not present

## 2018-09-01 NOTE — Patient Instructions (Signed)
Laura Ellis , Thank you for taking time to come for your Medicare Wellness Visit. I appreciate your ongoing commitment to your health goals. Please review the following plan we discussed and let me know if I can assist you in the future.   Please schedule your next medicare wellness visit with me in 1 yr. Continue doing brain stimulating activities (puzzles, reading, adult coloring books, staying active) to keep memory sharp.    These are the goals we discussed: Goals    . Patient Stated     Patient stated would like to have back feeling better. Not a candidate for back surgery but is thinking about the stem cell.

## 2018-09-11 ENCOUNTER — Other Ambulatory Visit: Payer: Self-pay

## 2018-09-11 ENCOUNTER — Encounter (HOSPITAL_BASED_OUTPATIENT_CLINIC_OR_DEPARTMENT_OTHER): Payer: Medicare HMO | Attending: Internal Medicine

## 2018-09-11 ENCOUNTER — Encounter (HOSPITAL_BASED_OUTPATIENT_CLINIC_OR_DEPARTMENT_OTHER): Payer: Self-pay

## 2018-09-24 DIAGNOSIS — G8929 Other chronic pain: Secondary | ICD-10-CM | POA: Diagnosis not present

## 2018-09-24 DIAGNOSIS — I129 Hypertensive chronic kidney disease with stage 1 through stage 4 chronic kidney disease, or unspecified chronic kidney disease: Secondary | ICD-10-CM | POA: Diagnosis not present

## 2018-09-24 DIAGNOSIS — M545 Low back pain: Secondary | ICD-10-CM | POA: Diagnosis not present

## 2018-09-24 DIAGNOSIS — N184 Chronic kidney disease, stage 4 (severe): Secondary | ICD-10-CM | POA: Diagnosis not present

## 2018-09-25 DIAGNOSIS — H401131 Primary open-angle glaucoma, bilateral, mild stage: Secondary | ICD-10-CM | POA: Diagnosis not present

## 2018-09-29 ENCOUNTER — Ambulatory Visit (INDEPENDENT_AMBULATORY_CARE_PROVIDER_SITE_OTHER): Payer: Medicare HMO | Admitting: Osteopathic Medicine

## 2018-09-29 ENCOUNTER — Encounter: Payer: Self-pay | Admitting: Osteopathic Medicine

## 2018-09-29 VITALS — BP 129/72 | HR 92 | Wt 144.5 lb

## 2018-09-29 DIAGNOSIS — K2100 Gastro-esophageal reflux disease with esophagitis, without bleeding: Secondary | ICD-10-CM

## 2018-09-29 DIAGNOSIS — R1030 Lower abdominal pain, unspecified: Secondary | ICD-10-CM | POA: Diagnosis not present

## 2018-09-29 DIAGNOSIS — R42 Dizziness and giddiness: Secondary | ICD-10-CM | POA: Diagnosis not present

## 2018-09-29 MED ORDER — PANTOPRAZOLE SODIUM 40 MG PO TBEC: 40.0000 mg | DELAYED_RELEASE_TABLET | Freq: Every day | ORAL | 3 refills | Status: DC

## 2018-09-29 MED ORDER — AMLODIPINE BESYLATE 5 MG PO TABS: 5.0000 mg | ORAL_TABLET | Freq: Every day | ORAL | 1 refills | Status: DC

## 2018-09-29 NOTE — Progress Notes (Addendum)
Virtual Visit via Phone Note  I connected with      Laura Ellis on 09/29/18 at 1:10 by a telemedicine application and verified that I am speaking with the correct person using two identifiers. Called cell phone [phone]  Patient is at ome I am working form home   I discussed the limitations of evaluation and management by telemedicine and the availability of in person appointments. The patient expressed understanding and agreed to proceed.  History of Present Illness: Laura Ellis is a 85 y.o. female who would like to discuss  Chief Complaints  Patient presents with  . Blood Pressure Check  . Gastroesophageal Reflux    starting a few wks ago, has had esophageal dilatations and thinks might need another. Epigastric pain with certain foods, no dysphagia.   . Dizziness    has returned, was fine for awhile This is more bothersome symptom. Reports it's getting worse. Describes feeling unsteady, worse when getting out of bed or out of a chair. No walker use, uses cane when leaving the house.   [name] Pelvic Pain    lower part of belly - on/off couple of weeks ago. Has bothered her periodically for years, had an episode of pain few weeks ago that was quite severe, had to grab hold of the counter and she rubbed her abdomen. No new urinary frequency or dysuria. Hx hysterectomy.      Observations/Objective: BP 129/72 (BP Location: Left Arm, Patient Position: Sitting, Cuff Size: Normal)   Pulse 92   Wt 144 lb 8 oz (65.5 kg)   BMI 27.30 kg/m  BP Readings from Last 3 Encounters:  09/29/18 129/72  09/01/18 128/68  04/08/18 (!) 159/66   Exam: Normal Speech.   Lab and Radiology Results No results found for this or any previous visit (from the past 72 hour(s)). No results found.     Assessment and Plan: 85 y.o. female with The primary encounter diagnosis was Gastroesophageal reflux disease with esophagitis. Diagnoses of Orthostatic dizziness and Lower abdominal pain were  also pertinent to this visit.  Gastroesophageal reflux disease with esophagitis - refer to GI, starting Protonix, ER precuations reviewed re: severe pain, hematemesis, etc  - Plan: Ambulatory referral to Gastroenterology  Orthostatic dizziness - holding amlodipine for now, will call on Friday to recheck. She recently got labs at nephro, hasn't heard back, will request records   Lower abdominal pain - intermittent/rare, sharp pain, sounds likely d/t scar tissue, no other concerning symptoms, will monitor    PDMP not reviewed this encounter. Orders Placed This Encounter  Procedures  . Ambulatory referral to Gastroenterology    Referral Priority:   Routine    Referral Type:   Consultation    Referral Reason:   Specialty Services Required    Number of Visits Requested:   1   Meds ordered this encounter  Medications  . pantoprazole (PROTONIX) 40 MG tablet    Sig: Take 1 tablet (40 mg total) by mouth daily.    Dispense:  30 tablet    Refill:  3  . amLODipine (NORVASC) 5 MG tablet    Sig: Take 1 tablet (5 mg total) by mouth daily. HOLDING AS OF 09/29/18 FOR CONCERN FOR ORTHOSTATIC HYPOTENSION    Dispense:  90 tablet    Refill:  1    Instructions sent via MyChart. If MyChart not available, pt was given option for info via personal e-mail w/ no guarantee of protected health info over unsecured e-mail communication,  and MyChart sign-up instructions were included.   Follow Up Instructions: Return for will call on Friday to recheck, follow-up will depend on that .    I discussed the assessment and treatment plan with the patient. The patient was provided an opportunity to ask questions and all were answered. The patient agreed with the plan and demonstrated an understanding of the instructions.   The patient was advised to call back or seek an in-person evaluation if any new concerns, if symptoms worsen or if the condition fails to improve as anticipated.  25 minutes of non-face-to-face  time was provided during this encounter.                      Historical information moved to improve visibility of documentation.  Past Medical History:  Diagnosis Date  . Arthritis   . Chronic low back pain 05/26/2015  . CKD (chronic kidney disease) stage 4, GFR 15-29 ml/min (HCC) 02/23/2016  . Essential hypertension 05/26/2015  . Hypertension   . Hypothyroidism 07/05/2015  . Osteopenia   . Osteoporosis 05/26/2015   old Dexa reviewed, pt states doesn't want to be on the bisphosphonate therapy but is taking Ca and D   . Spinal stenosis of lumbar region at multiple levels 03/13/2018   MRI [facility]oped[facility] January 2019   Past Surgical History:  Procedure Laterality Date  . ABDOMINAL HYSTERECTOMY    . ENDOVENOUS ABLATION SAPHENOUS VEIN W/ LASER Right 12/27/2016   endovenous laser ablation right greater saphenous vein by [name] MD  . KNEE ARTHROSCOPY    . VARICOSE VEIN SURGERY     Social History   Tobacco Use  . Smoking status: Former Research scientist (life sciences)  . Smokeless tobacco: Former Systems developer    Quit date: 12/03/2001  Substance Use Topics  . Alcohol use: Yes    Alcohol/week: 1.0 standard drinks    Types: 1 Glasses of wine per week    Comment: occasionally   family history includes Cancer in her father; Heart disease in her sister; Hypertension in her sister; Stroke in her mother.  Medications: Current Outpatient Medications  Medication Sig Dispense Refill  . amLODipine (NORVASC) 5 MG tablet Take 1 tablet (5 mg total) by mouth daily. HOLDING AS OF 09/29/18 FOR CONCERN FOR ORTHOSTATIC HYPOTENSION 90 tablet 1  . Ascorbic Acid (VITAMIN C) 1000 MG tablet Take 1,000 mg by mouth daily.    . Coenzyme Q10 (COQ10) 100 MG CAPS Take by mouth daily.    . Cyanocobalamin (VITAMIN B-12 PO) Take 1 tablet by mouth daily.    [name] escitalopram (LEXAPRO) 5 MG tablet Take 1 tablet (5 mg total) by mouth at bedtime. 90 tablet 1  . latanoprost (XALATAN) 0.005 % ophthalmic solution     .  losartan (COZAAR) 25 MG tablet Take 25 mg by mouth daily.    . Multiple Vitamin (MULTIVITAMIN WITH MINERALS) TABS tablet Take 1 tablet by mouth daily.    [name] NAPROXEN PO Take 1 tablet by mouth 2 (two) times daily.    . Omega-3 Fatty Acids (FISH OIL PO) Take 1 capsule by mouth daily.    [name] OVER THE COUNTER MEDICATION Take 1 tablet by mouth daily.    . pantoprazole (PROTONIX) 40 MG tablet Take 1 tablet (40 mg total) by mouth daily. 30 tablet 3  . pneumococcal 13-valent conjugate vaccine (PREVNAR 13) SUSP injection     . Red Yeast Rice 600 MG CAPS Take 600 mg by mouth.    . Urea 40 %  GEL Apply to abnormal nail/skin as directed 25 mL 1   No current facility-administered medications for this visit.    Allergies  Allergen Reactions  . Alendronate Sodium     esophagitis   . Ace Inhibitors     Cough  . Ezetimibe     (Zetia)  . Fenofibrate     arthralgia   . Other   . Rosuvastatin Calcium   . Rosuvastatin Calcium   . Statins Other (See Comments)    Muscle aches    PDMP not reviewed this encounter. Orders Placed This Encounter  Procedures  . Ambulatory referral to Gastroenterology    Referral Priority:   Routine    Referral Type:   Consultation    Referral Reason:   Specialty Services Required    Number of Visits Requested:   1   Meds ordered this encounter  Medications  . pantoprazole (PROTONIX) 40 MG tablet    Sig: Take 1 tablet (40 mg total) by mouth daily.    Dispense:  30 tablet    Refill:  3  . amLODipine (NORVASC) 5 MG tablet    Sig: Take 1 tablet (5 mg total) by mouth daily. HOLDING AS OF 09/29/18 FOR CONCERN FOR ORTHOSTATIC HYPOTENSION    Dispense:  90 tablet    Refill:  1

## 2018-10-01 ENCOUNTER — Other Ambulatory Visit: Payer: Medicare HMO

## 2018-10-03 ENCOUNTER — Telehealth: Payer: Self-pay | Admitting: Osteopathic Medicine

## 2018-10-03 NOTE — Telephone Encounter (Signed)
Patient made aware to stay off medication. She will monitor BP and let us know if anything changes. RX removed from med list.

## 2018-10-03 NOTE — Telephone Encounter (Signed)
Called patient back. She states she took two readings. 1st BP- 133/89, P- 80. 2nd (5 minutes later) BP- 134/83, P- 87. Please advise.

## 2018-10-03 NOTE — Telephone Encounter (Signed)
Spoke with patient. She states dizziness is almost back to normal (has issues with dizziness at baseline). She has not checked her blood pressure since stopping Amlodipine. She is going to check her blood pressure and I will call back to get that reading.

## 2018-10-03 NOTE — Telephone Encounter (Signed)
Blood pressure is good, as long as dizziness is better I think she can stay off the amlodipine, please make sure it's off her med list, thanks!

## 2018-10-03 NOTE — Telephone Encounter (Signed)
Please call patient: At virtual visit earlier this week we decided to hold her amlodipine blood pressure medication due to concerns for dizziness.  Has this helped at all or if she still experiencing symptoms?

## 2018-10-14 ENCOUNTER — Other Ambulatory Visit: Payer: Self-pay

## 2018-10-14 ENCOUNTER — Telehealth (INDEPENDENT_AMBULATORY_CARE_PROVIDER_SITE_OTHER): Payer: Medicare HMO | Admitting: Gastroenterology

## 2018-10-14 ENCOUNTER — Encounter: Payer: Self-pay | Admitting: Gastroenterology

## 2018-10-14 VITALS — Ht 61.0 in | Wt 148.0 lb

## 2018-10-14 DIAGNOSIS — K219 Gastro-esophageal reflux disease without esophagitis: Secondary | ICD-10-CM | POA: Diagnosis not present

## 2018-10-14 DIAGNOSIS — R1319 Other dysphagia: Secondary | ICD-10-CM

## 2018-10-14 DIAGNOSIS — K449 Diaphragmatic hernia without obstruction or gangrene: Secondary | ICD-10-CM

## 2018-10-14 DIAGNOSIS — R1310 Dysphagia, unspecified: Secondary | ICD-10-CM

## 2018-10-14 MED ORDER — PANTOPRAZOLE SODIUM 20 MG PO TBEC: 20.0000 mg | DELAYED_RELEASE_TABLET | Freq: Every day | ORAL | 11 refills | Status: DC

## 2018-10-14 NOTE — Addendum Note (Signed)
Addended by: Herma Mering D on: 10/14/2018 02:44 PM   Modules accepted: Orders

## 2018-10-14 NOTE — Patient Instructions (Signed)
If you are age 83 or older, your body mass index should be between 23-30. Your Body mass index is 27.96 kg/m. If this is out of the aforementioned range listed, please consider follow up with your Primary Care Provider.  If you are age 71 or younger, your body mass index should be between 19-25. Your Body mass index is 27.96 kg/m. If this is out of the aformentioned range listed, please consider follow up with your Primary Care Provider.   You have been scheduled for an endoscopy. Please follow written instructions given to you at your visit today. If you use inhalers (even only as needed), please bring them with you on the day of your procedure. Your physician has requested that you go to www.startemmi.com and enter the access code given to you at your visit today. This web site gives a general overview about your procedure. However, you should still follow specific instructions given to you by our office regarding your preparation for the procedure.  To help prevent the possible spread of infection to our patients, communities, and staff; we will be implementing the following measures:  As of now we are not allowing any visitors/family members to accompany you to any upcoming appointments with [facility]. If you have any concerns about this please contact our office to discuss prior to the appointment.   We have sent the following medications to your pharmacy for you to pick up at your convenience: Protonix 20mg  every morning 30 minutes before breakfast.  Thank you,  Dr. Jackquline Denmark

## 2018-10-14 NOTE — Progress Notes (Signed)
Chief Complaint: Dysphagia  Referring Provider:  Emeterio Reeve, DO      ASSESSMENT AND PLAN;   #1. GERD with [place] #2. Esophageal dysphagia likely d/t Schatzki's ring s/p dil 05/2023. R/O other causes.  Plan: -Protonix 20mg  po qAM, 1/2hr before breakfast #30, 11 refills. -EGD with eso dil at [facility].  I have discussed risks and benefits including small risks of perforation, bleeding, aspiration.  Benefits were also discussed.  She wishes to proceed. -I have instructed patient that she needs to chew foods especially meats and breads well and eat slowly.   HPI:    Laura Ellis is a 85 y.o. female  With solid food dysphagia (potato chips, pumpkn pie) x last several months, getting somewhat worse Associated with heartburn.  No problems with liquids.  Stopped taking Protonix over a year ago. Mainly in the mid chest.  Similar to what she had in the past. She denies having any odynophagia.  No melena or hematochezia.  Did take naproxen briefly due to back pain.  Currently is having stem cell injections in the back.  Advised not to get back surgery d/t advanced age.  No unintentional weight loss.  Denies having any consistent diarrhea or constipation.  She has been followed by nephrology and diagnosed with CRI.  Creatinine is getting somewhat better.  She has been on Protonix 40mg  previously.  Did not have any additional problems.  Willing to try 20 qd  Past GI procedures: At [facility] -EGD June 10, 2013-Schatzki's ring, hiatal hernia from 35 to 38 cm.  S/p dilatation 54Fr. small nodule at the GE junction. Bx-hyperplastic -Colonoscopy June 10, 2013-1 cm cecal polyp status post polypectomy.  Biopsies tubular adenoma.  No need to repeat due to age.  Had colonoscopy 2005. Past Medical History:  Diagnosis Date  . Arthritis   . Chronic low back pain 05/26/2015  . CKD (chronic kidney disease) stage 4, GFR 15-29 ml/min (HCC) 02/23/2016  . Essential hypertension 05/26/2015  .  Glaucoma   . Hypertension   . Hypothyroidism 07/05/2015  . Osteopenia   . Osteoporosis 05/26/2015   old Dexa reviewed, pt states doesn't want to be on the bisphosphonate therapy but is taking Ca and D   . Spinal stenosis of lumbar region at multiple levels 03/13/2018   [facility] orthopedics January 2019    Past Surgical History:  Procedure Laterality Date  . ABDOMINAL HYSTERECTOMY    . COLONOSCOPY  2015   [facility]  . ENDOVENOUS ABLATION SAPHENOUS VEIN W/ LASER Right 12/27/2016   endovenous laser ablation right greater saphenous vein by [name] MD  . ESOPHAGOGASTRODUODENOSCOPY  2015   x2 or 3 times in [place]  . KNEE ARTHROSCOPY    . VARICOSE VEIN SURGERY      Family History  Problem Relation Age of Onset  . Stroke Mother   . Lung cancer Father   . Heart disease Sister   . Hypertension Sister   . Colon cancer Neg Hx   . Esophageal cancer Neg Hx     Social History   Tobacco Use  . Smoking status: Former Research scientist (life sciences)  . Smokeless tobacco: Never Used  . Tobacco comment: quit 2003  Substance Use Topics  . Alcohol use: Yes    Alcohol/week: 1.0 standard drinks    Types: 1 Glasses of wine per week    Comment: occasionally  . Drug use: No    Current Outpatient Medications  Medication Sig Dispense Refill  . AMBULATORY NON  FORMULARY MEDICATION 1 tablet 2 (two) times daily. Bone Care    . Ascorbic Acid (VITAMIN C) 1000 MG tablet Take 1,000 mg by mouth daily.    . Coenzyme Q10 (COQ10) 100 MG CAPS Take by mouth daily.    . Cyanocobalamin (VITAMIN B-12 PO) Take 1 tablet by mouth as needed (every other day).     [name] losartan (COZAAR) 25 MG tablet Take 25 mg by mouth daily.    . Multiple Vitamin (MULTIVITAMIN WITH MINERALS) TABS tablet Take 1 tablet by mouth daily.    . Red Yeast Rice 600 MG CAPS Take 600 mg by mouth.    . timolol (TIMOPTIC) 0.25 % ophthalmic solution 1 drop at bedtime.    . Urea 40 % GEL Apply to abnormal nail/skin as directed (Patient  taking differently: Apply to abnormal nail/skin as directed. Patient said she applies it to her big toe) 25 mL 1  . pantoprazole (PROTONIX) 40 MG tablet Take 1 tablet (40 mg total) by mouth daily. (Patient not taking: Reported on 10/14/2018) 30 tablet 3   No current facility-administered medications for this visit.     Allergies  Allergen Reactions  . Alendronate Sodium     esophagitis   . Ace Inhibitors     Cough  . Ezetimibe     (Zetia)  . Fenofibrate     arthralgia   . Other   . Rosuvastatin Calcium   . Rosuvastatin Calcium   . Statins Other (See Comments)    Muscle aches    Review of Systems:  Constitutional: Denies fever, chills, diaphoresis, appetite change and fatigue.  HEENT: Denies photophobia, eye pain, redness, hearing loss, ear pain, congestion, sore throat, rhinorrhea, sneezing, mouth sores, neck pain, neck stiffness and tinnitus.   Respiratory: Denies SOB, DOE, cough, chest tightness,  and wheezing.   Cardiovascular: Denies chest pain, palpitations and leg swelling.  Genitourinary: Denies dysuria, urgency, frequency, hematuria, flank pain and difficulty urinating.  Musculoskeletal: Denies myalgias, has chronic back pain.  Skin: No rash.  Neurological: Denies dizziness, seizures, syncope, weakness, light-headedness, numbness and headaches.  Hematological: Denies adenopathy. Easy bruising, personal or family bleeding history  Psychiatric/Behavioral: No anxiety or depression     Physical Exam:    Ht 5\' 1"  (1.549 m)   Wt 148 lb (67.1 kg)   BMI 27.96 kg/m  Filed Weights   10/14/18 1006  Weight: 148 lb (67.1 kg)   televist  Data Reviewed: I have personally reviewed following labs and imaging studies  CBC: CBC Latest Ref Rng & Units 03/24/2018 06/10/2017 03/07/2017  WBC 3.8 - 10.8 Thousand/uL 7.0 7.4 8.0  Hemoglobin 11.7 - 15.5 g/dL 11.9 13.2 12.8  Hematocrit 35.0 - 45.0 % 35.6 39 37.2  Platelets 140 - 400 Thousand/uL 282 - 311    CMP: CMP Latest Ref  Rng & Units 03/24/2018 06/10/2017 03/07/2017  Glucose 65 - 99 mg/dL 68 - 87  BUN 7 - 25 mg/dL 23 33(A) 38(H)  Creatinine 0.60 - 0.88 mg/dL 1.44(H) 1.8(A) 1.87(H)  Sodium 135 - 146 mmol/L 141 143 142  Potassium 3.5 - 5.3 mmol/L 4.5 4.3 4.0  Chloride 98 - 110 mmol/L 107 101 103  CO2 20 - 32 mmol/L 27 24 31   Calcium 8.6 - 10.4 mg/dL 9.1 9.9 9.8  Total Protein 6.1 - 8.1 g/dL 6.7 7.4 6.9  Total Bilirubin 0.2 - 1.2 mg/dL 0.3 - 0.4  Alkaline Phos 25 - 125 - 71 -  AST 10 - 35 U/L 26 27 25  ALT 6 - 29 U/L 15 20 15   This service was provided via telemedicine.  The patient was located at home.  The provider was located in office.  The patient did consent to this telephone visit and is aware of possible charges through their insurance for this visit.  The patient was referred by Dr. Sheppard Coil.   Time spent on call/coordination of care: 39min    Carmell Austria, MD 10/14/2018, 1:49 PM  Cc: Emeterio Reeve, DO

## 2018-10-15 ENCOUNTER — Ambulatory Visit (INDEPENDENT_AMBULATORY_CARE_PROVIDER_SITE_OTHER): Payer: Medicare HMO

## 2018-10-15 DIAGNOSIS — M810 Age-related osteoporosis without current pathological fracture: Secondary | ICD-10-CM

## 2018-10-15 DIAGNOSIS — Z0000 Encounter for general adult medical examination without abnormal findings: Secondary | ICD-10-CM | POA: Diagnosis not present

## 2018-10-17 ENCOUNTER — Other Ambulatory Visit: Payer: Self-pay | Admitting: Osteopathic Medicine

## 2018-10-21 ENCOUNTER — Telehealth: Payer: Self-pay | Admitting: *Deleted

## 2018-10-21 NOTE — Telephone Encounter (Signed)

## 2018-10-23 ENCOUNTER — Encounter: Payer: Self-pay | Admitting: Gastroenterology

## 2018-10-23 ENCOUNTER — Ambulatory Visit (AMBULATORY_SURGERY_CENTER): Payer: Medicare HMO | Admitting: Gastroenterology

## 2018-10-23 ENCOUNTER — Other Ambulatory Visit: Payer: Self-pay

## 2018-10-23 VITALS — BP 117/51 | HR 71 | Temp 98.4°F | Resp 25 | Ht 61.0 in | Wt 144.0 lb

## 2018-10-23 DIAGNOSIS — I10 Essential (primary) hypertension: Secondary | ICD-10-CM | POA: Diagnosis not present

## 2018-10-23 DIAGNOSIS — N184 Chronic kidney disease, stage 4 (severe): Secondary | ICD-10-CM | POA: Diagnosis not present

## 2018-10-23 DIAGNOSIS — K219 Gastro-esophageal reflux disease without esophagitis: Secondary | ICD-10-CM | POA: Diagnosis not present

## 2018-10-23 DIAGNOSIS — K3189 Other diseases of stomach and duodenum: Secondary | ICD-10-CM | POA: Diagnosis not present

## 2018-10-23 DIAGNOSIS — K449 Diaphragmatic hernia without obstruction or gangrene: Secondary | ICD-10-CM

## 2018-10-23 DIAGNOSIS — K2970 Gastritis, unspecified, without bleeding: Secondary | ICD-10-CM

## 2018-10-23 DIAGNOSIS — R1310 Dysphagia, unspecified: Secondary | ICD-10-CM

## 2018-10-23 DIAGNOSIS — K222 Esophageal obstruction: Secondary | ICD-10-CM | POA: Diagnosis not present

## 2018-10-23 DIAGNOSIS — K210 Gastro-esophageal reflux disease with esophagitis: Secondary | ICD-10-CM | POA: Diagnosis not present

## 2018-10-23 DIAGNOSIS — R55 Syncope and collapse: Secondary | ICD-10-CM | POA: Diagnosis not present

## 2018-10-23 MED ORDER — SODIUM CHLORIDE 0.9 % IV SOLN: 500.0000 mL | Freq: Once | INTRAVENOUS | Status: DC

## 2018-10-23 NOTE — Patient Instructions (Addendum)
Handouts given for gastritis, hiatal hernia and Post-Dilation diet.  Avoid NSAIDS(aspirin containing products)  Protonix 20mg  daily for 12 weeks.  YOU HAD AN ENDOSCOPIC PROCEDURE TODAY AT [facility]:   Refer to the procedure report that was given to you for any specific questions about what was found during the examination.  If the procedure report does not answer your questions, please call your gastroenterologist to clarify.  If you requested that your care partner not be given the details of your procedure findings, then the procedure report has been included in a sealed envelope for you to review at your convenience later.  YOU SHOULD EXPECT: Some feelings of bloating in the abdomen. Passage of more gas than usual.  Walking can help get rid of the air that was put into your GI tract during the procedure and reduce the bloating. If you had a lower endoscopy (such as a colonoscopy or flexible sigmoidoscopy) you may notice spotting of blood in your stool or on the toilet paper. If you underwent a bowel prep for your procedure, you may not have a normal bowel movement for a few days.  Please Note:  You might notice some irritation and congestion in your nose or some drainage.  This is from the oxygen used during your procedure.  There is no need for concern and it should clear up in a day or so.  SYMPTOMS TO REPORT IMMEDIATELY:    Following upper endoscopy (EGD)  Vomiting of blood or coffee ground material  New chest pain or pain under the shoulder blades  Painful or persistently difficult swallowing  New shortness of breath  Fever of 100F or higher  Black, tarry-looking stools  For urgent or emergent issues, a gastroenterologist can be reached at any hour by calling [phone].   DIET:  SEE POST DILATION DIET INSTRUCTIONS.  We  recommend tomorrow to proceed to your regular diet.  Drink plenty of fluids but you should avoid alcoholic beverages for 24  hours.  ACTIVITY:  You should plan to take it easy for the rest of today and you should NOT DRIVE or use heavy machinery until tomorrow (because of the sedation medicines used during the test).    FOLLOW UP: Our staff will call the number listed on your records 48-72 hours following your procedure to check on you and address any questions or concerns that you may have regarding the information given to you following your procedure. If we do not reach you, we will leave a message.  We will attempt to reach you two times.  During this call, we will ask if you have developed any symptoms of COVID 19. If you develop any symptoms (ie: fever, flu-like symptoms, shortness of breath, cough etc.) before then, please call [phone].  If you test positive for Covid 19 in the 2 weeks post procedure, please call and report this information to [place].    If any biopsies were taken you will be contacted by phone or by letter within the next 1-3 weeks.  Please call us at [phone] if you have not heard about the biopsies in 3 weeks.    SIGNATURES/CONFIDENTIALITY: You and/or your care partner have signed paperwork which will be entered into your electronic medical record.  These signatures attest to the fact that that the information above on your After Visit Summary has been reviewed and is understood.  Full responsibility of the confidentiality of this discharge information lies with you and/or your care-partner.

## 2018-10-23 NOTE — Op Note (Signed)
[place] Patient Name: Ernisha Sorn Procedure Date: 10/23/2018 2:16 PM MRN: [id number] Endoscopist: Jackquline Denmark , MD Age: 85 Referring MD:  Date of Birth: 1933-03-20 Gender: Female Account #: [id number] Procedure:                Upper GI endoscopy Indications:              Dysphagia, GERD Medicines:                Monitored Anesthesia Care Procedure:                Pre-Anesthesia Assessment:                           - Prior to the procedure, a History and Physical                            was performed, and patient medications and                            allergies were reviewed. The patient's tolerance of                            previous anesthesia was also reviewed. The risks                            and benefits of the procedure and the sedation                            options and risks were discussed with the patient.                            All questions were answered, and informed consent                            was obtained. Prior Anticoagulants: The patient has                            taken no previous anticoagulant or antiplatelet                            agents. ASA Grade Assessment: III - A patient with                            severe systemic disease. After reviewing the risks                            and benefits, the patient was deemed in                            satisfactory condition to undergo the procedure.                           After obtaining informed consent, the endoscope was  passed under direct vision. Throughout the                            procedure, the patient's blood pressure, pulse, and                            oxygen saturations were monitored continuously. The                            Endoscope was introduced through the mouth, and                            advanced to the second part of duodenum. The upper                            GI endoscopy was accomplished without  difficulty.                            The patient tolerated the procedure well. Scope In: Scope Out: Findings:                 The examined esophagus was moderately tortuous with                            corkscrew appearance. Biopsies were taken with a                            cold forceps for histology. Estimated blood loss:                            none.                           A mild Schatzki ring was found at the                            gastroesophageal junction,35 cm from the incisors.                            Biopsies were taken with a cold forceps for                            histology. The scope was withdrawn. Dilation was                            performed with a Maloney dilator with no resistance                            at 52 Fr and mild resistance at 54 Fr. Estimated                            blood loss: none.                           A 3  cm hiatal hernia was present extending from 35                            cm up to 38 cm.                           Localized moderate inflammation characterized by                            erythema and few erosions was found in the gastric                            antrum. Biopsies were taken with a cold forceps for                            histology. Estimated blood loss: none. Well-healed                            stellate antral scar was noted.                           The examined duodenum was normal. Complications:            No immediate complications. Estimated Blood Loss:     Estimated blood loss: none. Impression:               - Schatzki ring s/p esophageal dilatation.                           - 3 cm hiatal hernia.                           - Corkscrew esophagus.                           - Erosive gastritis. Recommendation:           - Patient has a contact number available for                            emergencies. The signs and symptoms of potential                            delayed  complications were discussed with the                            patient. Return to normal activities tomorrow.                            Written discharge instructions were provided to the                            patient.                           - Post-dilatation diet.                           -  Protonix 20 mg p.o. once a day. (Low-dose being                            used due to CRI).                           - Avoid nonsteroidals.                           - Await pathology results.                           - Return to GI clinic in 12 weeks. If still with                            problems with dysphagia, would consider esophageal                            manometry. Jackquline Denmark, MD 10/23/2018 2:51:05 PM This report has been signed electronically.

## 2018-10-23 NOTE — Progress Notes (Signed)
Report to PACU, RN, vss, BBS= Clear.  

## 2018-10-27 ENCOUNTER — Telehealth: Payer: Self-pay

## 2018-10-27 NOTE — Telephone Encounter (Signed)
  Follow up Call-  Call back number 10/23/2018  Post procedure Call Back phone  # [phone]  Permission to leave phone message Yes  Some recent data might be hidden     Patient questions:  Do you have a fever, pain , or abdominal swelling? No. Pain Score  0 *  Have you tolerated food without any problems? Yes.    Have you been able to return to your normal activities? Yes.    Do you have any questions about your discharge instructions: Diet   No. Medications  No. Follow up visit  No.  Do you have questions or concerns about your Care? No.  Actions: * If pain score is 4 or above: 1. No action needed, pain <4.Have you developed a fever since your procedure? no  2.   Have you had an respiratory symptoms (SOB or cough) since your procedure? no  3.   Have you tested positive for COVID 19 since your procedure no  4.   Have you had any family members/close contacts diagnosed with the COVID 19 since your procedure?  no   If yes to any of these questions please route to Joylene John, RN and Alphonsa Gin, Therapist, sports.

## 2018-10-29 ENCOUNTER — Encounter: Payer: Self-pay | Admitting: Gastroenterology

## 2018-11-10 ENCOUNTER — Telehealth (HOSPITAL_COMMUNITY): Payer: Self-pay | Admitting: Rehabilitation

## 2018-11-10 NOTE — Telephone Encounter (Signed)

## 2018-11-11 ENCOUNTER — Ambulatory Visit: Payer: Medicare HMO | Admitting: Vascular Surgery

## 2018-11-26 ENCOUNTER — Ambulatory Visit (INDEPENDENT_AMBULATORY_CARE_PROVIDER_SITE_OTHER): Payer: Medicare HMO | Admitting: Osteopathic Medicine

## 2018-11-26 ENCOUNTER — Encounter: Payer: Self-pay | Admitting: Osteopathic Medicine

## 2018-11-26 ENCOUNTER — Ambulatory Visit: Payer: Medicare HMO

## 2018-11-26 ENCOUNTER — Other Ambulatory Visit: Payer: Self-pay

## 2018-11-26 VITALS — BP 151/85 | HR 95 | Temp 98.1°F | Wt 144.6 lb

## 2018-11-26 DIAGNOSIS — R7989 Other specified abnormal findings of blood chemistry: Secondary | ICD-10-CM | POA: Diagnosis not present

## 2018-11-26 DIAGNOSIS — R55 Syncope and collapse: Secondary | ICD-10-CM

## 2018-11-26 DIAGNOSIS — N184 Chronic kidney disease, stage 4 (severe): Secondary | ICD-10-CM

## 2018-11-26 DIAGNOSIS — K449 Diaphragmatic hernia without obstruction or gangrene: Secondary | ICD-10-CM | POA: Insufficient documentation

## 2018-11-26 DIAGNOSIS — R001 Bradycardia, unspecified: Secondary | ICD-10-CM | POA: Diagnosis not present

## 2018-11-26 DIAGNOSIS — N390 Urinary tract infection, site not specified: Secondary | ICD-10-CM

## 2018-11-26 DIAGNOSIS — R748 Abnormal levels of other serum enzymes: Secondary | ICD-10-CM | POA: Diagnosis not present

## 2018-11-26 DIAGNOSIS — E038 Other specified hypothyroidism: Secondary | ICD-10-CM

## 2018-11-26 DIAGNOSIS — R410 Disorientation, unspecified: Secondary | ICD-10-CM | POA: Diagnosis not present

## 2018-11-26 DIAGNOSIS — E039 Hypothyroidism, unspecified: Secondary | ICD-10-CM | POA: Diagnosis not present

## 2018-11-26 DIAGNOSIS — I6523 Occlusion and stenosis of bilateral carotid arteries: Secondary | ICD-10-CM | POA: Diagnosis not present

## 2018-11-26 DIAGNOSIS — M79605 Pain in left leg: Secondary | ICD-10-CM

## 2018-11-26 DIAGNOSIS — K222 Esophageal obstruction: Secondary | ICD-10-CM | POA: Insufficient documentation

## 2018-11-26 DIAGNOSIS — M79662 Pain in left lower leg: Secondary | ICD-10-CM | POA: Diagnosis not present

## 2018-11-26 DIAGNOSIS — R42 Dizziness and giddiness: Secondary | ICD-10-CM

## 2018-11-26 DIAGNOSIS — K224 Dyskinesia of esophagus: Secondary | ICD-10-CM | POA: Insufficient documentation

## 2018-11-26 NOTE — Progress Notes (Addendum)
HPI: Laura Ellis is a 85 y.o. female who  has a past medical history of Arthritis, Chronic low back pain (05/26/2015), CKD (chronic kidney disease) stage 4, GFR 15-29 ml/min (HCC) (02/23/2016), Essential hypertension (05/26/2015), Glaucoma, Hypertension, Hypothyroidism (07/05/2015), Osteopenia, Osteoporosis (05/26/2015), and Spinal stenosis of lumbar region at multiple levels (03/13/2018).  she presents to [facility] today, 11/26/18,  for chief complaint of: Dizziness  Longstanding issue. Reports feeling of unsteadiness, worse when getting out of bed, going from seated to standing. Last visit (phone visit 09/29/18) we decided to hold amlodipine. She had gotten blood work at her nephrologist but I didn't have results available, requested then, still don't have them. TSH was abnormal back 03/2018 - subclinical hypothyroidism (previously on thyroid meds but stopped these) and recheck labs was advised, she's overdue.   Today, patient and daughter report dizziness has been worsening over the past couple of months, definitely a lot worse since the past week, she apparently had a syncopal episode at home 7 days ago at which point her daughter, who is an Therapist, sports who lives in [place], drove up to help out mom (patient) and sister (primary caretaker).   Reports lightheadedness, also spinning sensation, worse w/ standing up, can happen without trigger. Daughter reports increased confusion and heart rate dropping.   Concern for pain behind L knee and in L calf as well.   Patient is accompanied by daughter who assists with history-taking.     Past medical, surgical, social and family history reviewed:  Patient Active Problem List   Diagnosis Date Noted  . 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 Schatzki's ring of distal esophagus 11/26/2018  . Hiatal hernia 11/26/2018  . Spinal stenosis of lumbar region at multiple levels 03/13/2018  . Advance directive discussed with patient  01/16/2018  . Memory loss 01/16/2018  . Renal cyst, left 07/18/2017  . Orthostatic dizziness 03/07/2017  . CKD (chronic kidney disease) stage 4, GFR 15-29 ml/min (HCC) 02/23/2016  . Trochanteric bursitis of left hip 01/17/2016  . Left ankle pain 01/17/2016  . Hypothyroidism 07/05/2015  . Osteoporosis 05/26/2015  . Cataract 05/26/2015  . Bilateral knee pain 05/26/2015  . Chronic low back pain 05/26/2015  . Essential hypertension 05/26/2015    Past Surgical History:  Procedure Laterality Date  . ABDOMINAL HYSTERECTOMY    . COLONOSCOPY  2015   [facility]  . ENDOVENOUS ABLATION SAPHENOUS VEIN W/ LASER Right 12/27/2016   endovenous laser ablation right greater saphenous vein by [name] MD  . ESOPHAGOGASTRODUODENOSCOPY  2015   x2 or 3 times in [place]  . KNEE ARTHROSCOPY    . VARICOSE VEIN SURGERY      Social History   Tobacco Use  . Smoking status: Former Research scientist (life sciences)  . Smokeless tobacco: Never Used  . Tobacco comment: quit 2003  Substance Use Topics  . Alcohol use: Yes    Alcohol/week: 1.0 standard drinks    Types: 1 Glasses of wine per week    Comment: occasionally    Family History  Problem Relation Age of Onset  . Stroke Mother   . Lung cancer Father   . Heart disease Sister   . Hypertension Sister   . Colon cancer Neg Hx   . Esophageal cancer Neg Hx      Current medication list and allergy/intolerance information reviewed:    Current Outpatient Medications  Medication Sig Dispense Refill  . AMBULATORY NON FORMULARY MEDICATION 1 tablet 2 (two) times daily. Bone Care    .  Ascorbic Acid (VITAMIN C) 1000 MG tablet Take 1,000 mg by mouth daily.    . Coenzyme Q10 (COQ10) 100 MG CAPS Take by mouth daily.    . Cyanocobalamin (VITAMIN B-12 PO) Take 1 tablet by mouth as needed (every other day).     [name] losartan (COZAAR) 25 MG tablet Take 25 mg by mouth daily.    . Multiple Vitamin (MULTIVITAMIN WITH MINERALS) TABS tablet Take 1 tablet by mouth  daily.    . pantoprazole (PROTONIX) 20 MG tablet Take 1 tablet (20 mg total) by mouth daily. 30 tablet 11  . Red Yeast Rice 600 MG CAPS Take 600 mg by mouth.    . timolol (TIMOPTIC) 0.25 % ophthalmic solution 1 drop at bedtime.    . Urea 40 % GEL Apply to abnormal nail/skin as directed 25 mL 1   No current facility-administered medications for this visit.     Allergies  Allergen Reactions  . Alendronate Sodium     esophagitis   . Ace Inhibitors     Cough  . Ezetimibe     (Zetia)  . Fenofibrate     arthralgia   . Other   . Rosuvastatin Calcium   . Rosuvastatin Calcium   . Statins Other (See Comments)    Muscle aches      Review of Systems:  Constitutional:  No  fever, no chills, No recent illness, No unintentional weight changes. +significant fatigue.   HEENT: No  headache, no vision change  Cardiac: No  chest pain, No  pressure, No palpitations, No  Orthopnea  Respiratory:  +shortness of breath only on exertion. No  Cough  Gastrointestinal: No  abdominal pain, No  nausea, No  vomiting,  No  blood in stool, No  diarrhea, No  constipation   Musculoskeletal: No new myalgia/arthralgia  Skin: No  Rash, No other wounds/concerning lesions  Neurologic: +generalized weakness, +dizziness, No  slurred speech/focal weakness/facial droop  Psychiatric: No  concerns with depression, No  concerns with anxiety, No sleep problems, No mood problems  Exam:  BP (!) 151/85 (BP Location: Left Arm, Patient Position: Sitting, Cuff Size: Normal)   Pulse 95   Temp 98.1 F (36.7 C) (Oral)   Wt 144 lb 9.6 oz (65.6 kg)   SpO2 94%   BMI 27.32 kg/m   Constitutional: VS see above. General Appearance: alert, well-developed, well-nourished, NAD  Eyes: Normal lids and conjunctive, non-icteric sclera  Neck: No masses, trachea midline. No thyroid enlargement.  Respiratory: Normal respiratory effort. no wheeze, no rhonchi, no rales  Cardiovascular: S1/S2 normal, no murmur, no rub/gallop  auscultated. RRR. No lower extremity edema.   Gastrointestinal: Nontender, no masses  Musculoskeletal: Gait normal. No clubbing/cyanosis of digits.   Neurological: Normal balance/coordination. No tremor.   Skin: warm, dry, intact. No rash/ulcer.  Psychiatric: Fair judgment/insight. Normal mood and affect. Oriented x3.    Results for orders placed or performed in visit on 11/26/18 (from the past 72 hour(s))  CBC     Status: Abnormal   Collection Time: 11/26/18  3:23 PM  Result Value Ref Range   WBC 6.5 3.8 - 10.8 Thousand/uL   RBC 3.79 (L) 3.80 - 5.10 Million/uL   Hemoglobin 12.0 11.7 - 15.5 g/dL   HCT 35.4 35.0 - 45.0 %   MCV 93.4 80.0 - 100.0 fL   MCH 31.7 27.0 - 33.0 pg   MCHC 33.9 32.0 - 36.0 g/dL   RDW 12.7 11.0 - 15.0 %   Platelets 272 140 -  400 Thousand/uL   MPV 10.2 7.5 - 12.5 fL  COMPLETE METABOLIC PANEL WITH GFR     Status: Abnormal   Collection Time: 11/26/18  3:23 PM  Result Value Ref Range   Glucose, Bld 90 65 - 99 mg/dL    Comment: .            Fasting reference interval .    BUN 23 7 - 25 mg/dL   Creat 1.60 (H) 0.60 - 0.88 mg/dL    Comment: For patients >38 years of age, the reference limit for Creatinine is approximately 13% higher for people identified as African-American. .    GFR, Est Non African American 29 (L) > OR = 60 mL/min/1.13m2   GFR, Est African American 34 (L) > OR = 60 mL/min/1.55m2   BUN/Creatinine Ratio 14 6 - 22 (calc)   Sodium 138 135 - 146 mmol/L   Potassium 5.0 3.5 - 5.3 mmol/L   Chloride 104 98 - 110 mmol/L   CO2 27 20 - 32 mmol/L   Calcium 9.7 8.6 - 10.4 mg/dL   Total Protein 6.8 6.1 - 8.1 g/dL   Albumin 4.1 3.6 - 5.1 g/dL   Globulin 2.7 1.9 - 3.7 g/dL (calc)   AG Ratio 1.5 1.0 - 2.5 (calc)   Total Bilirubin 0.5 0.2 - 1.2 mg/dL   Alkaline phosphatase (APISO) 82 37 - 153 U/L   AST 34 10 - 35 U/L   ALT 16 6 - 29 U/L  T4, free     Status: None   Collection Time: 11/26/18  3:23 PM  Result Value Ref Range   Free T4 1.4 0.8 -  1.8 ng/dL  TSH     Status: Abnormal   Collection Time: 11/26/18  3:23 PM  Result Value Ref Range   TSH 8.41 (H) 0.40 - 4.50 mIU/L  Magnesium     Status: None   Collection Time: 11/26/18  3:23 PM  Result Value Ref Range   Magnesium 2.0 1.5 - 2.5 mg/dL  Phosphorus     Status: None   Collection Time: 11/26/18  3:23 PM  Result Value Ref Range   Phosphorus 4.3 2.1 - 4.3 mg/dL  B12 and Folate Panel     Status: Abnormal   Collection Time: 11/26/18  3:23 PM  Result Value Ref Range   Vitamin B-12 1,751 (H) 200 - 1,100 pg/mL   Folate >24.0 ng/mL    Comment:                            Reference Range                            Low:           <3.4                            Borderline:    3.4-5.4                            Normal:        >5.4 .   Urinalysis, Routine w reflex microscopic     Status: Abnormal   Collection Time: 11/26/18  3:49 PM  Result Value Ref Range   Color, Urine DARK YELLOW YELLOW   APPearance CLEAR CLEAR   Specific Gravity, Urine 1.017 1.001 -  1.03   pH 5.5 5.0 - 8.0   Glucose, UA NEGATIVE NEGATIVE   Bilirubin Urine NEGATIVE NEGATIVE   Ketones, ur NEGATIVE NEGATIVE   Hgb urine dipstick NEGATIVE NEGATIVE   Protein, ur TRACE (A) NEGATIVE   Nitrite NEGATIVE NEGATIVE   Leukocytes,Ua 2+ (A) NEGATIVE   WBC, UA 10-20 (A) 0 - 5 /HPF   RBC / HPF 0-2 0 - 2 /HPF   Squamous Epithelial / LPF 0-5 < OR = 5 /HPF   Bacteria, UA MODERATE (A) NONE SEEN /HPF   Hyaline Cast NONE SEEN NONE SEEN /LPF    No results found.               ASSESSMENT/PLAN: The primary encounter diagnosis was Orthostatic dizziness. Diagnoses of Bradycardia, Subclinical hypothyroidism, Elevated vitamin B12 level, CKD (chronic kidney disease) stage 4, GFR 15-29 ml/min (HCC), Confusion, Syncope, unspecified syncope type, Left leg pain, and Complicated UTI (urinary tract infection) were also pertinent to this visit.   Orders Placed This Encounter  Procedures  . Urine Culture  . US Carotid  Duplex Bilateral  . MR Brain Wo Contrast  . US Venous Img Lower Unilateral Left  . CBC  . COMPLETE METABOLIC PANEL WITH GFR  . T4, free  . TSH  . Magnesium  . Phosphorus  . B12 and Folate Panel  . Urinalysis, Routine w reflex microscopic  . Ambulatory referral to Cardiology  . Holter monitor - 48 hour  . ECHOCARDIOGRAM COMPLETE    No orders of the defined types were placed in this encounter.   Patient Instructions  Dizziness can be caused by lots of different things! Let's look for all possibilities!   Heart: EKG today looks ok. Let's get an echocardiogram and another carotid ultrasound to make sure heart is pumping ok and blood is getting to brain. I'd like to get you set up with a heart monitor to see what the heart rate and rhythm is looking like longer-term. Let's get a second opinion from a cardiologist but will work on getting these tests first.   Neurologic: Will get set up with MRI of the brain given increased dizziness and confusion.   Other: Labs to evaluate for infection, thyroid problem, anemia, electrolyte imbalance, liver or kidney problems, urinary tract infection, etc.   Blood pressure: take whole pill of the Losartan. Keep an eye on the blood pressure, numbers under 150/90 are fine. If dizziness gets worse, please go back to half tablet.   Leg: I doubt DVT, but we can check ultrasound just to be safe.        Visit summary with medication list and pertinent instructions was printed for patient to review. All questions at time of visit were answered - patient instructed to contact office with any additional concerns or updates. ER/RTC precautions were reviewed with the patient.   Note: Total time spent 40 minutes, greater than 50% of the visit was spent face-to-face counseling and coordinating care for the above diagnoses listed in assessment/plan.   Please note: voice recognition software was used to produce this document, and typos may escape review. Please  contact Dr. Sheppard Coil for any needed clarifications.     Follow-up plan: Return in about 1 week (around 12/03/2018) for review results, recheck symptoms. See me sooner if needed.

## 2018-11-26 NOTE — Patient Instructions (Addendum)
Dizziness can be caused by lots of different things! Let's look for all possibilities!   Heart: EKG today looks ok. Let's get an echocardiogram and another carotid ultrasound to make sure heart is pumping ok and blood is getting to brain. I'd like to get you set up with a heart monitor to see what the heart rate and rhythm is looking like longer-term. Let's get a second opinion from a cardiologist but will work on getting these tests first.   Neurologic: Will get set up with MRI of the brain given increased dizziness and confusion.   Other: Labs to evaluate for infection, thyroid problem, anemia, electrolyte imbalance, liver or kidney problems, urinary tract infection, etc.   Blood pressure: take whole pill of the Losartan. Keep an eye on the blood pressure, numbers under 150/90 are fine. If dizziness gets worse, please go back to half tablet.   Leg: I doubt DVT, but we can check ultrasound just to be safe.

## 2018-11-27 LAB — CBC
HCT: 35.4 % (ref 35.0–45.0)
Hemoglobin: 12 g/dL (ref 11.7–15.5)
MCH: 31.7 pg (ref 27.0–33.0)
MCHC: 33.9 g/dL (ref 32.0–36.0)
MCV: 93.4 fL (ref 80.0–100.0)
MPV: 10.2 fL (ref 7.5–12.5)
Platelets: 272 10*3/uL (ref 140–400)
RBC: 3.79 10*6/uL — ABNORMAL LOW (ref 3.80–5.10)
RDW: 12.7 % (ref 11.0–15.0)
WBC: 6.5 10*3/uL (ref 3.8–10.8)

## 2018-11-27 LAB — COMPLETE METABOLIC PANEL WITH GFR
AG Ratio: 1.5 (calc) (ref 1.0–2.5)
ALT: 16 U/L (ref 6–29)
AST: 34 U/L (ref 10–35)
Albumin: 4.1 g/dL (ref 3.6–5.1)
Alkaline phosphatase (APISO): 82 U/L (ref 37–153)
BUN/Creatinine Ratio: 14 (calc) (ref 6–22)
BUN: 23 mg/dL (ref 7–25)
CO2: 27 mmol/L (ref 20–32)
Calcium: 9.7 mg/dL (ref 8.6–10.4)
Chloride: 104 mmol/L (ref 98–110)
Creat: 1.6 mg/dL — ABNORMAL HIGH (ref 0.60–0.88)
GFR, Est African American: 34 mL/min/{1.73_m2} — ABNORMAL LOW (ref >=60)
GFR, Est Non African American: 29 mL/min/{1.73_m2} — ABNORMAL LOW (ref >=60)
Globulin: 2.7 g/dL (calc) (ref 1.9–3.7)
Glucose, Bld: 90 mg/dL (ref 65–99)
Potassium: 5 mmol/L (ref 3.5–5.3)
Sodium: 138 mmol/L (ref 135–146)
Total Bilirubin: 0.5 mg/dL (ref 0.2–1.2)
Total Protein: 6.8 g/dL (ref 6.1–8.1)

## 2018-11-27 LAB — T4, FREE: Free T4: 1.4 ng/dL (ref 0.8–1.8)

## 2018-11-27 LAB — PHOSPHORUS: Phosphorus: 4.3 mg/dL (ref 2.1–4.3)

## 2018-11-27 LAB — TSH: TSH: 8.41 mIU/L — ABNORMAL HIGH (ref 0.40–4.50)

## 2018-11-27 LAB — B12 AND FOLATE PANEL
Folate: >24 ng/mL
Vitamin B-12: 1751 pg/mL — ABNORMAL HIGH (ref 200–1100)

## 2018-11-27 LAB — MAGNESIUM: Magnesium: 2 mg/dL (ref 1.5–2.5)

## 2018-11-27 MED ORDER — AMLODIPINE BESYLATE 5 MG PO TABS: 5.0000 mg | ORAL_TABLET | Freq: Every day | ORAL | 1 refills | Status: DC

## 2018-11-27 MED ORDER — CIPROFLOXACIN HCL 500 MG PO TABS: 500.0000 mg | ORAL_TABLET | Freq: Every day | ORAL | 0 refills | Status: AC

## 2018-11-27 MED ORDER — LEVOTHYROXINE SODIUM 50 MCG PO TABS: 50.0000 ug | ORAL_TABLET | Freq: Every day | ORAL | 0 refills | Status: DC

## 2018-11-27 NOTE — Addendum Note (Signed)
Addended by: Maryla Morrow on: 11/27/2018 05:36 PM   Modules accepted: Orders

## 2018-11-28 LAB — URINALYSIS, ROUTINE W REFLEX MICROSCOPIC
Bilirubin Urine: NEGATIVE
Glucose, UA: NEGATIVE
Hgb urine dipstick: NEGATIVE
Hyaline Cast: NONE SEEN /LPF
Ketones, ur: NEGATIVE
Nitrite: NEGATIVE
Specific Gravity, Urine: 1.017 (ref 1.001–1.03)
pH: 5.5 (ref 5.0–8.0)

## 2018-11-28 LAB — URINE CULTURE
MICRO NUMBER:: 671781
SPECIMEN QUALITY:: ADEQUATE

## 2018-11-28 NOTE — Progress Notes (Signed)
I called the patient's daughter and she voices understanding. She reports that the patient has been to [place] and has been getting plasma treatment for her Arthritis. They take the blood and spin it down and inject the plasma into the affected area and she reports its mostly in her back. The last time it was from a Placenta that was tested.

## 2018-11-30 ENCOUNTER — Ambulatory Visit (INDEPENDENT_AMBULATORY_CARE_PROVIDER_SITE_OTHER): Payer: Medicare HMO

## 2018-11-30 ENCOUNTER — Other Ambulatory Visit: Payer: Self-pay

## 2018-11-30 DIAGNOSIS — R410 Disorientation, unspecified: Secondary | ICD-10-CM

## 2018-11-30 DIAGNOSIS — R42 Dizziness and giddiness: Secondary | ICD-10-CM

## 2018-11-30 DIAGNOSIS — R55 Syncope and collapse: Secondary | ICD-10-CM | POA: Diagnosis not present

## 2018-12-02 NOTE — Progress Notes (Signed)
Cardiology Office Note:    Date:  12/04/2018   ID:  Laura Ellis, DOB Mar 03, 1933, MRN [id number]  PCP:  Emeterio Reeve, DO  Cardiologist:  Shirlee More, MD   Referring MD: Emeterio Reeve, DO  ASSESSMENT:    1. Syncope and collapse   2. Essential hypertension   3. Ischemia of left lower extremity    PLAN:    In order of problems listed above:  1. Syncope I suspect this was bradycardia today with beta-blocker eyedrops will apply a ZIO monitor for the next 2 weeks. 2. Stable I talked to the daughter about withdrawing medications there has been an elevated unchanged 3. Refer to Dr. early vascular surgery as soon as possible  Next appointment 4 weeks   Medication Adjustments/Labs and Tests Ordered: Current medicines are reviewed at length with the patient today.  Concerns regarding medicines are outlined above.  Orders Placed This Encounter  Procedures  . Ambulatory referral to Vascular Surgery  . LONG TERM MONITOR (3-14 DAYS)  . EKG 12-Lead   No orders of the defined types were placed in this encounter.    No chief complaint on file.   History of Present Illness:    Laura Ellis is a 85 y.o. female who is being seen today for the evaluation of syncope, positional dizzyness and bradycardia at the request of Emeterio Reeve, DO.  She has a history of hypertension taking amlodipine and an ARB as well as glaucoma using beta-blocker eyedrop.  Her heart rate the last 3 office visits was 81,92 and 95.  Blood pressure has ranged from 128/68-150 1/85 over this timeframe. She also has a history of chronic venous insufficiency with a stasis ulcer of the left lower extremity which is been addressed with wound care.  She is referred here after syncopal episode her daughter is a Marine scientist and is present call me if truly was near syncopal did not quite lose consciousness but a profound episode.  1 or more of her antihypertensive agents were held she developed severe  hypertension with confusion and slurred speech was placed back on her medication.  Since that time she has had lightheaded episodes.  She takes a beta-blocker.  It is for glaucoma.  She has had a nonhealing wound left lower extremity with complains bitterly of rest pain in the left foot and she appears to have chronic limb ischemia she missed an appointment with vascular surgery and she will be referred.  No chest pain edema orthopnea or palpitation. Past Medical History:  Diagnosis Date  . Arthritis   . Chronic low back pain 05/26/2015  . CKD (chronic kidney disease) stage 4, GFR 15-29 ml/min (HCC) 02/23/2016  . Essential hypertension 05/26/2015  . Glaucoma   . Hypertension   . Hypothyroidism 07/05/2015  . Osteopenia   . Osteoporosis 05/26/2015   old Dexa reviewed, pt states doesn't want to be on the bisphosphonate therapy but is taking Ca and D   . Spinal stenosis of lumbar region at multiple levels 03/13/2018   MRI [facility] January 2019    Past Surgical History:  Procedure Laterality Date  . ABDOMINAL HYSTERECTOMY    . COLONOSCOPY  2015   [facility]  . ENDOVENOUS ABLATION SAPHENOUS VEIN W/ LASER Right 12/27/2016   endovenous laser ablation right greater saphenous vein by [name] MD  . ESOPHAGOGASTRODUODENOSCOPY  2015   x2 or 3 times in [place]  . KNEE ARTHROSCOPY    . VARICOSE VEIN SURGERY  Current Medications: Current Meds  Medication Sig  . AMBULATORY NON FORMULARY MEDICATION 1 tablet 2 (two) times daily. Bone Care  . amLODipine (NORVASC) 5 MG tablet Take 1 tablet (5 mg total) by mouth daily.  . Ascorbic Acid (VITAMIN C) 1000 MG tablet Take 1,000 mg by mouth daily.  . ciprofloxacin (CIPRO) 500 MG tablet Take 1 tablet (500 mg total) by mouth daily for 10 days.  . Coenzyme Q10 (COQ10) 100 MG CAPS Take 1 capsule by mouth daily.   . Cyanocobalamin (VITAMIN B-12 PO) Take 1 tablet by mouth as needed (every other day).   [name] levothyroxine  (SYNTHROID) 50 MCG tablet Take 1 tablet (50 mcg total) by mouth daily.  [name] losartan (COZAAR) 25 MG tablet Take 25 mg by mouth daily.  . Multiple Vitamin (MULTIVITAMIN WITH MINERALS) TABS tablet Take 1 tablet by mouth daily.  . pantoprazole (PROTONIX) 20 MG tablet Take 1 tablet (20 mg total) by mouth daily.  . Red Yeast Rice 600 MG CAPS Take 600 mg by mouth daily.   . timolol (TIMOPTIC) 0.25 % ophthalmic solution Place 1 drop into both eyes 2 (two) times daily.   . Urea 40 % GEL Apply to abnormal nail/skin as directed     Allergies:   Alendronate sodium, Ace inhibitors, Ezetimibe, Fenofibrate, Other, Rosuvastatin calcium, and Statins   Social History   Socioeconomic History  . Marital status: Widowed    Spouse name: Not on file  . Number of children: 3  . Years of education: 73  . Highest education level: Some college, no degree  Occupational History  . Occupation: Engineer, site    Comment: retired  Scientific laboratory technician  . Financial resource strain: Not hard at all  . Food insecurity    Worry: Never true    Inability: Never true  . Transportation needs    Medical: No    Non-medical: No  Tobacco Use  . Smoking status: Former Smoker    Years: 40.00    Types: Cigarettes  . Smokeless tobacco: Never Used  . Tobacco comment: quit 2003  Substance and Sexual Activity  . Alcohol use: Yes    Alcohol/week: 1.0 standard drinks    Types: 1 Glasses of wine per week    Comment: occasionally-rare  . Drug use: No  . Sexual activity: Not Currently  Lifestyle  . Physical activity    Days per week: 0 days    Minutes per session: 0 min  . Stress: Not at all  Relationships  . Social Herbalist on phone: Once a week    Gets together: Never    Attends religious service: Never    Active member of club or organization: No    Attends meetings of clubs or organizations: Never    Relationship status: Widowed  Other Topics Concern  . Not on file  Social History Narrative   Feeds all  her animals- cats- rescue   Chores around the house   Watches TV   Coffee 2 cups in the am     Family History: The patient's family history includes Fibromyalgia in her daughter; Heart disease in her sister; Hypertension in her daughter and sister; Lung cancer in her father; Rheum arthritis in her daughter; Stroke in her mother. There is no history of Colon cancer or Esophageal cancer.  ROS:   Review of Systems  Constitution: Positive for malaise/fatigue.  HENT: Negative.   Eyes: Negative.   Cardiovascular: Positive for syncope.  Respiratory: Negative.  Endocrine: Negative.   Hematologic/Lymphatic: Negative.   Skin: Positive for color change.  Musculoskeletal: Negative.   Gastrointestinal: Negative.   Genitourinary: Negative.   Neurological: Positive for dizziness.  Psychiatric/Behavioral: Negative.   Allergic/Immunologic: Negative.    Please see the history of present illness.     All other systems reviewed and are negative.  EKGs/Labs/Other Studies Reviewed:    The following studies were reviewed today:  EKG 02/23/2016 [facility] OW normal  Echo 03/07/2016: normal for age Study Conclusions - Left ventricle: The cavity size was normal. Wall thickness was   increased in a pattern of mild LVH. Systolic function was normal.   The estimated ejection fraction was in the range of 60% to 65%.   Wall motion was normal; there were no regional wall motion   abnormalities. Doppler parameters are consistent with abnormal   left ventricular relaxation (grade 1 diastolic dysfunction). - Atrial septum: No defect or patent foramen ovale was identified. - Impressions: Normal GLS -19.8.   EKG:  EKG is  ordered today.  The ekg ordered today is personally reviewed and demonstrates sinus rhythm left anterior hemiblock occasional PVC  Recent Labs: 11/26/2018: ALT 16; BUN 23; Creat 1.60; Hemoglobin 12.0; Magnesium 2.0; Platelets 272; Potassium 5.0; Sodium 138; TSH 8.41  Recent Lipid Panel     Component Value Date/Time   CHOL 189 03/24/2018 1431   TRIG 146 03/24/2018 1431   HDL 65 03/24/2018 1431   CHOLHDL 2.9 03/24/2018 1431   VLDL 31 (H) 02/23/2016 1516   LDLCALC 99 03/24/2018 1431    Physical Exam:    VS:  BP 138/80 (BP Location: Right Arm, Patient Position: Standing, Cuff Size: Normal)   Pulse 84   Temp (!) 97.3 F (36.3 C)   Ht 5\' 2"  (1.575 m)   Wt 142 lb 1.9 oz (64.5 kg)   SpO2 97%   BMI 25.99 kg/m     Wt Readings from Last 3 Encounters:  12/04/18 142 lb 1.9 oz (64.5 kg)  11/26/18 144 lb 9.6 oz (65.6 kg)  10/23/18 144 lb (65.3 kg)     GEN: Very frail elderly woman well nourished, well developed in no acute distress HEENT: Normal NECK: No JVD; No carotid bruits LYMPHATICS: No lymphadenopathy CARDIAC: RRR, no murmurs, rubs, gallops RESPIRATORY:  Clear to auscultation without rales, wheezing or rhonchi  ABDOMEN: Soft, non-tender, non-distended MUSCULOSKELETAL:  No edema; No deformity  SKIN: Warm and dry NEUROLOGIC:  Alert and oriented x 3 PSYCHIATRIC:  Normal affect  Left lower extremity is cold from the midcalf down no palpable pulse but intact sensory and motor function the right lower extremity has a palpable dorsalis pedis is also cool   Signed, Shirlee More, MD  12/04/2018 5:10 PM    [facility]

## 2018-12-03 ENCOUNTER — Ambulatory Visit: Payer: Medicare HMO | Admitting: Osteopathic Medicine

## 2018-12-04 ENCOUNTER — Ambulatory Visit: Payer: Medicare HMO | Admitting: Osteopathic Medicine

## 2018-12-04 ENCOUNTER — Other Ambulatory Visit: Payer: Self-pay

## 2018-12-04 ENCOUNTER — Ambulatory Visit (INDEPENDENT_AMBULATORY_CARE_PROVIDER_SITE_OTHER): Payer: Medicare HMO | Admitting: Cardiology

## 2018-12-04 ENCOUNTER — Encounter: Payer: Self-pay | Admitting: Cardiology

## 2018-12-04 VITALS — BP 138/80 | HR 84 | Temp 97.3°F | Ht 62.0 in | Wt 142.1 lb

## 2018-12-04 DIAGNOSIS — I10 Essential (primary) hypertension: Secondary | ICD-10-CM

## 2018-12-04 DIAGNOSIS — I998 Other disorder of circulatory system: Secondary | ICD-10-CM | POA: Diagnosis not present

## 2018-12-04 DIAGNOSIS — R55 Syncope and collapse: Secondary | ICD-10-CM

## 2018-12-04 DIAGNOSIS — I739 Peripheral vascular disease, unspecified: Secondary | ICD-10-CM | POA: Insufficient documentation

## 2018-12-04 NOTE — Patient Instructions (Addendum)
Medication Instructions:  Your physician recommends that you continue on your current medications as directed. Please refer to the Current Medication list given to you today.  If you need a refill on your cardiac medications before your next appointment, please call your pharmacy.   Lab work: None If you have labs (blood work) drawn today and your tests are completely normal, you will receive your results only by: [name] MyChart Message (if you have MyChart) OR . A paper copy in the mail If you have any lab test that is abnormal or we need to change your treatment, we will call you to review the results.  Testing/Procedures: Your physician has recommended that you wear a ZIO monitor. ZIO monitors are medical devices that record the heart's electrical activity. Doctors most often use these monitors to diagnose arrhythmias. Arrhythmias are problems with the speed or rhythm of the heartbeat. The monitor is a small, portable device. You can wear one while you do your normal daily activities. This is usually used to diagnose what is causing palpitations/syncope (passing out).  WEAR 7 DAYS  Follow-Up: At [facility], you and your health needs are our priority.  As part of our continuing mission to provide you with exceptional heart care, we have created designated Provider Care Teams.  These Care Teams include your primary Cardiologist (physician) and Advanced Practice Providers (APPs -  Physician Assistants and Nurse Practitioners) who all work together to provide you with the care you need, when you need it. You will need a follow up appointment in 4 weeks.   Any Other Special Instructions Will Be Listed Below (If Applicable).   YOU HAVE BEEN REFERRED TO DR. TODD EARLY FOR YOU LEFT LEG. THEY WILL CONTACT YOU WITH AN APPOINTMENT TIME AND DATE.

## 2018-12-10 ENCOUNTER — Telehealth: Payer: Self-pay | Admitting: *Deleted

## 2018-12-10 ENCOUNTER — Other Ambulatory Visit (INDEPENDENT_AMBULATORY_CARE_PROVIDER_SITE_OTHER): Payer: Medicare HMO

## 2018-12-10 DIAGNOSIS — R55 Syncope and collapse: Secondary | ICD-10-CM

## 2018-12-10 NOTE — Telephone Encounter (Signed)
Call from patient's daughter claiming" Left lower extremity painful, purple, toes are blue". Cardiologist referred  to  office for Dr. Donnetta Hutching on 12/04/2018, and has been waiting for a call. Due to description of condition and availability of appointments instructed to go to the [facility] ER for evaluation and treatment. Vascular surgeon on call if needed.  Daughter explained to me that "no we will not be going to the ER, I am a nurse and keeping an eye on it". Patient has had multiple test done, Blood clot ruled out.  First available given given for 12/23/2018 with Dr. Donnetta Hutching

## 2018-12-10 NOTE — Telephone Encounter (Signed)
Instructed patient's daughter to go to ER for worsening condition. Will schedule sooner if any openings.

## 2018-12-11 ENCOUNTER — Other Ambulatory Visit: Payer: Self-pay | Admitting: *Deleted

## 2018-12-11 DIAGNOSIS — I739 Peripheral vascular disease, unspecified: Secondary | ICD-10-CM

## 2018-12-23 ENCOUNTER — Ambulatory Visit: Payer: Medicare HMO | Admitting: Vascular Surgery

## 2018-12-23 ENCOUNTER — Other Ambulatory Visit: Payer: Self-pay

## 2018-12-23 ENCOUNTER — Encounter: Payer: Self-pay | Admitting: Vascular Surgery

## 2018-12-23 ENCOUNTER — Ambulatory Visit (INDEPENDENT_AMBULATORY_CARE_PROVIDER_SITE_OTHER): Payer: Medicare HMO | Admitting: Vascular Surgery

## 2018-12-23 ENCOUNTER — Encounter: Payer: Self-pay | Admitting: *Deleted

## 2018-12-23 ENCOUNTER — Ambulatory Visit (HOSPITAL_COMMUNITY)
Admission: RE | Admit: 2018-12-23 | Discharge: 2018-12-23 | Disposition: A | Discharge status: Home / Self Care | Payer: Medicare HMO | Source: Ambulatory Visit | Attending: Vascular Surgery | Admitting: Vascular Surgery

## 2018-12-23 VITALS — BP 122/72 | HR 85 | Temp 97.2°F | Resp 18 | Ht 61.0 in | Wt 146.8 lb

## 2018-12-23 DIAGNOSIS — I739 Peripheral vascular disease, unspecified: Secondary | ICD-10-CM

## 2018-12-23 NOTE — Progress Notes (Signed)
`                                      [facility]ist of [place]  Patient name: Laura Ellis MRN: [id number] DOB: 1932/10/09 Sex: female  REASON FOR VISIT: Discuss critical limb ischemia left leg  HPI: Laura Ellis is a 85 y.o. female well-known to me from prior treatment of venous pathology.  She has had bilateral venous stasis ulceration.  She had undergone prior right great saphenous vein ablation by myself and bilateral staged stab phlebectomy of multiple tributary varicosities.  She had longstanding ulceration over her left pretibial area on her distal pretibial area above her ankle.  She has had complete healing of this several months ago.  She has had progressive changes of ischemia in her left foot.  She has no right leg symptoms.  She has severe arterial rest pain which awakens her every night makes it very difficult for her to keep her leg elevated.  She has had changes of dependent rubor and scaling on her left foot as well.  Fortunately she has had no progressive tissue loss.  She has had recent evaluation for dizziness and had a event monitor for a cardiac interrogation.  Past Medical History:  Diagnosis Date  . Arthritis   . Chronic low back pain 05/26/2015  . CKD (chronic kidney disease) stage 4, GFR 15-29 ml/min (HCC) 02/23/2016  . Essential hypertension 05/26/2015  . Glaucoma   . Hypertension   . Hypothyroidism 07/05/2015  . Osteopenia   . Osteoporosis 05/26/2015   old Dexa reviewed, pt states doesn't want to be on the bisphosphonate therapy but is taking Ca and D   . Spinal stenosis of lumbar region at multiple levels 03/13/2018   MRI [facility] orthopedics January 2019    Family History  Problem Relation Age of Onset  . Stroke Mother   . Lung cancer Father   . Heart disease Sister   . Hypertension Sister   . Hypertension Daughter   . Rheum arthritis Daughter   . Fibromyalgia Daughter   . Colon cancer Neg Hx   . Esophageal cancer Neg Hx      SOCIAL HISTORY: Social History   Tobacco Use  . Smoking status: Former Smoker    Years: 40.00    Types: Cigarettes  . Smokeless tobacco: Never Used  . Tobacco comment: quit 2003  Substance Use Topics  . Alcohol use: Yes    Alcohol/week: 1.0 standard drinks    Types: 1 Glasses of wine per week    Comment: occasionally-rare    Allergies  Allergen Reactions  . Alendronate Sodium     esophagitis   . Ace Inhibitors     Cough  . Ezetimibe     (Zetia)  . Fenofibrate     arthralgia   . Other   . Rosuvastatin Calcium   . Statins Other (See Comments)    Muscle aches    Current Outpatient Medications  Medication Sig Dispense Refill  . AMBULATORY NON FORMULARY MEDICATION 1 tablet 2 (two) times daily. Bone Care    . amLODipine (NORVASC) 5 MG tablet Take 1 tablet (5 mg total) by mouth daily. 90 tablet 1  . Ascorbic Acid (VITAMIN C) 1000 MG tablet Take 1,000 mg by mouth daily.    . Coenzyme Q10 (COQ10) 100 MG CAPS Take 1 capsule by mouth daily.     [name]  Cyanocobalamin (VITAMIN B-12 PO) Take 1 tablet by mouth as needed (every other day).     [name] levothyroxine (SYNTHROID) 50 MCG tablet Take 1 tablet (50 mcg total) by mouth daily. 90 tablet 0  . losartan (COZAAR) 25 MG tablet Take 25 mg by mouth daily.    . Multiple Vitamin (MULTIVITAMIN WITH MINERALS) TABS tablet Take 1 tablet by mouth daily.    . pantoprazole (PROTONIX) 20 MG tablet Take 1 tablet (20 mg total) by mouth daily. 30 tablet 11  . Red Yeast Rice 600 MG CAPS Take 600 mg by mouth daily.     . timolol (TIMOPTIC) 0.25 % ophthalmic solution Place 1 drop into both eyes 2 (two) times daily.     . Urea 40 % GEL Apply to abnormal nail/skin as directed 25 mL 1   No current facility-administered medications for this visit.     REVIEW OF SYSTEMS:  [X]  denotes positive finding, [ ]  denotes negative finding Cardiac  Comments:  Chest pain or chest pressure:    Shortness of breath upon exertion:    Short of breath when lying flat:     Irregular heart rhythm:        Vascular    Pain in calf, thigh, or hip brought on by ambulation:    Pain in feet at night that wakes you up from your sleep:  x   Blood clot in your veins:    Leg swelling:           PHYSICAL EXAM: Vitals:   12/23/18 1520  BP: 122/72  Pulse: 85  Resp: 18  Temp: (!) 97.2 F (36.2 C)  SpO2: 97%  Weight: 66.6 kg  Height: 5\' 1"  (1.549 m)    GENERAL: The patient is a well-nourished female, in no acute distress. The vital signs are documented above. CARDIOVASCULAR: 2+ radial and 2+ femoral pulses bilaterally.  Absent popliteal and distal pulses bilaterally. PULMONARY: There is good air exchange  MUSCULOSKELETAL: There are no major deformities or cyanosis. NEUROLOGIC: No focal weakness or paresthesias are detected. SKIN: There are no ulcers or rashes noted.  Redness and rubor over her distal half of her left foot.  No changes similar to this on the right pSYCHIATRIC: The patient has a normal affect.  DATA:  Ankle arm X today reveals right ABI of 1.80.  On the left 0.26  MEDICAL ISSUES: Long discussion with the patient and her daughter present.  She has critical limb ischemia on the left with threatened limb loss.  She does have chronic renal insufficiency and her last creatinine was 1.6.  I explained the need for arteriography for further evaluation.  Did explain the slight potential risk of worsening renal function.  She is at high risk for limb loss and less we can improve flow to her left foot.  With palpable femoral pulse, she may not be an endovascular treatment option.  We will make further recommendations following her arteriogram    Rosetta Posner, MD [facility] Tel [phone] Pager [phone]

## 2018-12-25 ENCOUNTER — Other Ambulatory Visit (HOSPITAL_COMMUNITY)
Admission: RE | Admit: 2018-12-25 | Discharge: 2018-12-25 | Disposition: A | Discharge status: Home / Self Care | Payer: Medicare HMO | Source: Ambulatory Visit | Attending: Vascular Surgery | Admitting: Vascular Surgery

## 2018-12-25 ENCOUNTER — Other Ambulatory Visit: Payer: Self-pay

## 2018-12-25 DIAGNOSIS — Z20828 Contact with and (suspected) exposure to other viral communicable diseases: Secondary | ICD-10-CM | POA: Diagnosis not present

## 2018-12-25 DIAGNOSIS — Z01812 Encounter for preprocedural laboratory examination: Secondary | ICD-10-CM | POA: Insufficient documentation

## 2018-12-26 LAB — SARS CORONAVIRUS 2 (TAT 6-24 HRS): SARS Coronavirus 2: NEGATIVE

## 2018-12-29 ENCOUNTER — Encounter (HOSPITAL_COMMUNITY)
Admission: RE | Disposition: A | Discharge status: Home / Self Care | Payer: Self-pay | Source: Home / Self Care | Attending: Vascular Surgery

## 2018-12-29 ENCOUNTER — Other Ambulatory Visit: Payer: Self-pay

## 2018-12-29 ENCOUNTER — Ambulatory Visit (HOSPITAL_COMMUNITY)
Admission: RE | Admit: 2018-12-29 | Discharge: 2018-12-29 | Disposition: A | Discharge status: Home / Self Care | Payer: Medicare HMO | Attending: Vascular Surgery | Admitting: Vascular Surgery

## 2018-12-29 DIAGNOSIS — Z888 Allergy status to other drugs, medicaments and biological substances status: Secondary | ICD-10-CM | POA: Insufficient documentation

## 2018-12-29 DIAGNOSIS — E039 Hypothyroidism, unspecified: Secondary | ICD-10-CM | POA: Insufficient documentation

## 2018-12-29 DIAGNOSIS — Z87891 Personal history of nicotine dependence: Secondary | ICD-10-CM | POA: Diagnosis not present

## 2018-12-29 DIAGNOSIS — Z823 Family history of stroke: Secondary | ICD-10-CM | POA: Insufficient documentation

## 2018-12-29 DIAGNOSIS — Z8249 Family history of ischemic heart disease and other diseases of the circulatory system: Secondary | ICD-10-CM | POA: Diagnosis not present

## 2018-12-29 DIAGNOSIS — M1990 Unspecified osteoarthritis, unspecified site: Secondary | ICD-10-CM | POA: Insufficient documentation

## 2018-12-29 DIAGNOSIS — Z79890 Hormone replacement therapy (postmenopausal): Secondary | ICD-10-CM | POA: Diagnosis not present

## 2018-12-29 DIAGNOSIS — H409 Unspecified glaucoma: Secondary | ICD-10-CM | POA: Insufficient documentation

## 2018-12-29 DIAGNOSIS — N184 Chronic kidney disease, stage 4 (severe): Secondary | ICD-10-CM | POA: Insufficient documentation

## 2018-12-29 DIAGNOSIS — R002 Palpitations: Secondary | ICD-10-CM | POA: Diagnosis not present

## 2018-12-29 DIAGNOSIS — I129 Hypertensive chronic kidney disease with stage 1 through stage 4 chronic kidney disease, or unspecified chronic kidney disease: Secondary | ICD-10-CM | POA: Insufficient documentation

## 2018-12-29 DIAGNOSIS — Z79899 Other long term (current) drug therapy: Secondary | ICD-10-CM | POA: Insufficient documentation

## 2018-12-29 DIAGNOSIS — M8580 Other specified disorders of bone density and structure, unspecified site: Secondary | ICD-10-CM | POA: Insufficient documentation

## 2018-12-29 DIAGNOSIS — I70222 Atherosclerosis of native arteries of extremities with rest pain, left leg: Secondary | ICD-10-CM | POA: Diagnosis not present

## 2018-12-29 HISTORY — PX: PERIPHERAL VASCULAR ATHERECTOMY: CATH118256

## 2018-12-29 HISTORY — PX: PERIPHERAL VASCULAR BALLOON ANGIOPLASTY: CATH118281

## 2018-12-29 HISTORY — PX: ABDOMINAL AORTOGRAM W/LOWER EXTREMITY: CATH118223

## 2018-12-29 LAB — POCT I-STAT, CHEM 8
BUN: 27 mg/dL — ABNORMAL HIGH (ref 8–23)
Calcium, Ion: 1.23 mmol/L (ref 1.15–1.40)
Chloride: 108 mmol/L (ref 98–111)
Creatinine, Ser: 1.8 mg/dL — ABNORMAL HIGH (ref 0.44–1.00)
Glucose, Bld: 80 mg/dL (ref 70–99)
HCT: 36 % (ref 36.0–46.0)
Hemoglobin: 12.2 g/dL (ref 12.0–15.0)
Potassium: 4.5 mmol/L (ref 3.5–5.1)
Sodium: 140 mmol/L (ref 135–145)
TCO2: 25 mmol/L (ref 22–32)

## 2018-12-29 LAB — POCT ACTIVATED CLOTTING TIME: Activated Clotting Time: 208 seconds

## 2018-12-29 SURGERY — ABDOMINAL AORTOGRAM W/LOWER EXTREMITY
Anesthesia: LOCAL | Laterality: Left

## 2018-12-29 MED ORDER — NITROGLYCERIN 1 MG/10 ML FOR IR/CATH LAB: INTRA_ARTERIAL | Status: DC | PRN

## 2018-12-29 MED ORDER — HYDRALAZINE HCL 20 MG/ML IJ SOLN: 5.0000 mg | INTRAMUSCULAR | Status: DC | PRN

## 2018-12-29 MED ORDER — SODIUM CHLORIDE 0.9 % IV SOLN: INTRAVENOUS | Status: DC

## 2018-12-29 MED ORDER — CLOPIDOGREL BISULFATE 75 MG PO TABS: 75.0000 mg | ORAL_TABLET | Freq: Every day | ORAL | Status: DC

## 2018-12-29 MED ORDER — SODIUM CHLORIDE 0.9% FLUSH: 3.0000 mL | Freq: Two times a day (BID) | INTRAVENOUS | Status: DC

## 2018-12-29 MED ORDER — SODIUM CHLORIDE 0.9 % IV SOLN: 250.0000 mL | INTRAVENOUS | Status: DC | PRN

## 2018-12-29 MED ORDER — ACETAMINOPHEN 325 MG PO TABS: 650.0000 mg | ORAL_TABLET | ORAL | Status: DC | PRN

## 2018-12-29 MED ORDER — CLOPIDOGREL BISULFATE 300 MG PO TABS
ORAL_TABLET | ORAL | Status: DC | PRN
  Administered 2018-12-29: 300 mg via ORAL

## 2018-12-29 MED ORDER — LIDOCAINE HCL (PF) 1 % IJ SOLN
INTRAMUSCULAR | Status: DC | PRN
  Administered 2018-12-29: 15 mL

## 2018-12-29 MED ORDER — LABETALOL HCL 5 MG/ML IV SOLN: 10.0000 mg | INTRAVENOUS | Status: DC | PRN

## 2018-12-29 MED ORDER — CLOPIDOGREL BISULFATE 75 MG PO TABS: 300.0000 mg | ORAL_TABLET | Freq: Once | ORAL | Status: DC

## 2018-12-29 MED ORDER — VERAPAMIL HCL 2.5 MG/ML IV SOLN
INTRAVENOUS | Status: AC
  Filled 2018-12-29: qty 2

## 2018-12-29 MED ORDER — HEPARIN (PORCINE) IN NACL 1000-0.9 UT/500ML-% IV SOLN
INTRAVENOUS | Status: DC | PRN
  Administered 2018-12-29 (×2): 500 mL

## 2018-12-29 MED ORDER — VIPERSLIDE LUBRICANT OPTIME
TOPICAL | Status: DC | PRN
  Administered 2018-12-29: 1000 mL via SURGICAL_CAVITY

## 2018-12-29 MED ORDER — HEPARIN SODIUM (PORCINE) 1000 UNIT/ML IJ SOLN
INTRAMUSCULAR | Status: DC | PRN
  Administered 2018-12-29: 6000 [IU] via INTRAVENOUS
  Administered 2018-12-29: 2000 [IU] via INTRAVENOUS

## 2018-12-29 MED ORDER — HEPARIN (PORCINE) IN NACL 1000-0.9 UT/500ML-% IV SOLN
INTRAVENOUS | Status: AC
  Filled 2018-12-29: qty 1000

## 2018-12-29 MED ORDER — CLOPIDOGREL BISULFATE 75 MG PO TABS: 75.0000 mg | ORAL_TABLET | Freq: Every day | ORAL | 11 refills | Status: DC

## 2018-12-29 MED ORDER — ONDANSETRON HCL 4 MG/2ML IJ SOLN: 4.0000 mg | Freq: Four times a day (QID) | INTRAMUSCULAR | Status: DC | PRN

## 2018-12-29 MED ORDER — CLOPIDOGREL BISULFATE 300 MG PO TABS
ORAL_TABLET | ORAL | Status: AC
  Filled 2018-12-29: qty 1

## 2018-12-29 MED ORDER — NITROGLYCERIN IN D5W 200-5 MCG/ML-% IV SOLN
INTRAVENOUS | Status: AC
  Filled 2018-12-29: qty 250

## 2018-12-29 MED ORDER — HEPARIN SODIUM (PORCINE) 1000 UNIT/ML IJ SOLN
INTRAMUSCULAR | Status: AC
  Filled 2018-12-29: qty 1

## 2018-12-29 MED ORDER — LIDOCAINE HCL (PF) 1 % IJ SOLN
INTRAMUSCULAR | Status: AC
  Filled 2018-12-29: qty 30

## 2018-12-29 MED ORDER — IODIXANOL 320 MG/ML IV SOLN
INTRAVENOUS | Status: DC | PRN
  Administered 2018-12-29: 45 mL via INTRA_ARTERIAL

## 2018-12-29 MED ORDER — SODIUM CHLORIDE 0.9% FLUSH: 3.0000 mL | INTRAVENOUS | Status: DC | PRN

## 2018-12-29 SURGICAL SUPPLY — 28 items
BALLN IN.PACT DCB 4X150 (BALLOONS) ×3
BALLN STERLING OTW 4X220X150 (BALLOONS) ×3
BALLOON STERLING OTW 4X220X150 (BALLOONS) IMPLANT
CATH OMNI FLUSH 5F 65CM (CATHETERS) ×1 IMPLANT
CATH QUICKCROSS SUPP .035X90CM (MICROCATHETER) ×1 IMPLANT
CLOSURE MYNX CONTROL 6F/7F (Vascular Products) ×1 IMPLANT
DCB IN.PACT 4X150 (BALLOONS) IMPLANT
DEVICE EMBOSHIELD NAV6 4.0-7.0 (FILTER) ×1 IMPLANT
DEVICE TORQUE .025-.038 (MISCELLANEOUS) ×1 IMPLANT
DIAMONDBACK CLASSIC OAS 1.5MM (CATHETERS) ×3
FILTER CO2 0.2 MICRON (VASCULAR PRODUCTS) ×1 IMPLANT
GUIDEWIRE ANGLED .035X260CM (WIRE) ×1 IMPLANT
KIT ENCORE 26 ADVANTAGE (KITS) ×1 IMPLANT
KIT MICROPUNCTURE NIT STIFF (SHEATH) ×1 IMPLANT
KIT PV (KITS) ×3 IMPLANT
LUBRICANT VIPERSLIDE CORONARY (MISCELLANEOUS) ×1 IMPLANT
RESERVOIR CO2 (VASCULAR PRODUCTS) ×1 IMPLANT
SET FLUSH CO2 (MISCELLANEOUS) ×1 IMPLANT
SHEATH FLEX ANSEL ST 6FR 45CM (SHEATH) ×1 IMPLANT
SHEATH PINNACLE 5F 10CM (SHEATH) ×1 IMPLANT
SHEATH PINNACLE 6F 10CM (SHEATH) ×1 IMPLANT
SHEATH PROBE COVER 6X72 (BAG) ×1 IMPLANT
SYR MEDRAD MARK V 150ML (SYRINGE) ×1 IMPLANT
SYSTEM DIMNDBCK CLSC OAS 1.5MM (CATHETERS) IMPLANT
TRANSDUCER W/STOPCOCK (MISCELLANEOUS) ×3 IMPLANT
TRAY PV CATH (CUSTOM PROCEDURE TRAY) ×3 IMPLANT
WIRE BENTSON .035X145CM (WIRE) ×1 IMPLANT
WIRE VIPER ADVANCE .017X335CM (WIRE) ×1 IMPLANT

## 2018-12-29 NOTE — Progress Notes (Signed)
Client up and walked and tolerated well; right groin stable, no bleeding or hematoma 

## 2018-12-29 NOTE — H&P (Signed)
   History and Physical Update  The patient was interviewed and re-examined.  The patient's previous History and Physical has been reviewed and is unchanged from recent office visit. Plan for CO2 aortogram with left lower extremity runoff and possible intervention today in PV lab.  Higinio Grow C. Donzetta Matters, MD [facility] [place] Office: [phone] Pager: [phone]   12/29/2018, 10:38 AM

## 2018-12-29 NOTE — Discharge Instructions (Signed)
Femoral Site Care °This sheet gives you information about how to care for yourself after your procedure. Your health care provider may also give you more specific instructions. If you have problems or questions, contact your health care provider. °What can I expect after the procedure? °After the procedure, it is common to have: °· Bruising that usually fades within 1-2 weeks. °· Tenderness at the site. °Follow these instructions at home: °Wound care °· Follow instructions from your health care provider about how to take care of your insertion site. Make sure you: °? Wash your hands with soap and water before you change your bandage (dressing). If soap and water are not available, use hand sanitizer. °? Change your dressing as told by your health care provider. °? Leave stitches (sutures), skin glue, or adhesive strips in place. These skin closures may need to stay in place for 2 weeks or longer. If adhesive strip edges start to loosen and curl up, you may trim the loose edges. Do not remove adhesive strips completely unless your health care provider tells you to do that. °· Do not take baths, swim, or use a hot tub until your health care provider approves. °· You may shower 24-48 hours after the procedure or as told by your health care provider. °? Gently wash the site with plain soap and water. °? Pat the area dry with a clean towel. °? Do not rub the site. This may cause bleeding. °· Do not apply powder or lotion to the site. Keep the site clean and dry. °· Check your femoral site every day for signs of infection. Check for: °? Redness, swelling, or pain. °? Fluid or blood. °? Warmth. °? Pus or a bad smell. °Activity °· For the first 2-3 days after your procedure, or as long as directed: °? Avoid climbing stairs as much as possible. °? Do not squat. °· Do not lift anything that is heavier than 10 lb (4.5 kg), or the limit that you are told, until your health care provider says that it is safe. °· Rest as  directed. °? Avoid sitting for a long time without moving. Get up to take short walks every 1-2 hours. °· Do not drive for 24 hours if you were given a medicine to help you relax (sedative). °General instructions °· Take over-the-counter and prescription medicines only as told by your health care provider. °· Keep all follow-up visits as told by your health care provider. This is important. °Contact a health care provider if you have: °· A fever or chills. °· You have redness, swelling, or pain around your insertion site. °Get help right away if: °· The catheter insertion area swells very fast. °· You pass out. °· You suddenly start to sweat or your skin gets clammy. °· The catheter insertion area is bleeding, and the bleeding does not stop when you hold steady pressure on the area. °· The area near or just beyond the catheter insertion site becomes pale, cool, tingly, or numb. °These symptoms may represent a serious problem that is an emergency. Do not wait to see if the symptoms will go away. Get medical help right away. Call your local emergency services (911 in the [place] Do not drive yourself to the hospital. °Summary °· After the procedure, it is common to have bruising that usually fades within 1-2 weeks. °· Check your femoral site every day for signs of infection. °· Do not lift anything that is heavier than 10 lb (4.5 kg), or the   limit that you are told, until your health care provider says that it is safe. °This information is not intended to replace advice given to you by your health care provider. Make sure you discuss any questions you have with your health care provider. °Document Released: 01/01/2014 Document Revised: 05/13/2017 Document Reviewed: 05/13/2017 °Elsevier Patient Education © 2020 [employer]vier Inc[employer] ° ° ° °Moderate Conscious Sedation, Adult, Care After °These instructions provide you with information about caring for yourself after your procedure. Your health care provider may also give you  more specific instructions. Your treatment has been planned according to current medical practices, but problems sometimes occur. Call your health care provider if you have any problems or questions after your procedure. °What can I expect after the procedure? °After your procedure, it is common: °· To feel sleepy for several hours. °· To feel clumsy and have poor balance for several hours. °· To have poor judgment for several hours. °· To vomit if you eat too soon. °Follow these instructions at home: °For at least 24 hours after the procedure: ° °· Do not: °? Participate in activities where you could fall or become injured. °? Drive. °? Use heavy machinery. °? Drink alcohol. °? Take sleeping pills or medicines that cause drowsiness. °? Make important decisions or sign legal documents. °? Take care of children on your own. °· Rest. °Eating and drinking °· Follow the diet recommended by your health care provider. °· If you vomit: °? Drink water, juice, or soup when you can drink without vomiting. °? Make sure you have little or no nausea before eating solid foods. °General instructions °· Have a responsible adult stay with you until you are awake and alert. °· Take over-the-counter and prescription medicines only as told by your health care provider. °· If you smoke, do not smoke without supervision. °· Keep all follow-up visits as told by your health care provider. This is important. °Contact a health care provider if: °· You keep feeling nauseous or you keep vomiting. °· You feel light-headed. °· You develop a rash. °· You have a fever. °Get help right away if: °· You have trouble breathing. °This information is not intended to replace advice given to you by your health care provider. Make sure you discuss any questions you have with your health care provider. °Document Released: 02/18/2013 Document Revised: 04/12/2017 Document Reviewed: 08/20/2015 °Elsevier Patient Education © 2020 Elsevier Inc. ° °

## 2018-12-29 NOTE — Op Note (Signed)
Patient name: Laura Ellis MRN: [id number] DOB: 01/28/1933 Sex: female  12/29/2018 Pre-operative Diagnosis: Critical left lower extremity ischemia with rest pain Post-operative diagnosis:  Same Surgeon:  Eda Paschal. Donzetta Matters, MD Procedure Performed: 1.  Ultrasound-guided cannulation right common femoral artery 2.  CO2 aortogram with left lower extremity angiography with contrast 3.  CSI atherectomy with 1.5 classic left SFA and popliteal arteries with distal embolic protection 4.  Drug-coated balloon angioplasty left SFA and popliteal arteries with 4 x 150 mm lutonix 5.  Mynx was closure right common femoral artery  Indications: 85 year old female with history of left lower extremity wounds has had vein ablations and is healed the wounds.  She has an ABI of 0.26 with chronic pain in the left leg.  She is indicated for angiography with possible intervention left lower extremity.  Creatinine is elevated will need evaluate left lower extremity and will use CO2 for aortogram given palpable bilateral common femoral pulses.  Findings: Aorta and iliac segments are free of flow-limiting stenosis.  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  Left SFA is initially patent and there is multilevel disease that is non-flow-limiting throughout the proximal SFA.  Distally the SFA occludes into the popliteal reconstitutes above the knee.  Runoff is via the anterior tibial and peroneal with peroneal being the vessel running onto the foot.  After intervention there is no longer any flow-limiting stenosis where previously was occluded.  This is resolved to 0% from 100%.  There is one small dissection that is non-flow-limiting.   Procedure:  The patient was identified in the holding area and taken to room 8.  The patient was then placed supine on the table and prepped and draped in the usual sterile fashion.  A time out was called.  Ultrasound was used to evaluate the right common femoral artery.  It was patent .  A  digital ultrasound image was acquired.  A micropuncture needle was used to access the right common femoral artery under ultrasound guidance.  An image was saved the permanent record.  An 018 wire was advanced without resistance and a micropuncture sheath was placed.  The 018 wire was removed and a benson wire was placed.  The micropuncture sheath was exchanged for a 5 french sheath.  An omniflush catheter was advanced over the wire to the level of L-1.  An abdominal angiogram was obtained with CO2.  We then crossed the bifurcation with Bentson wire Omni Flush catheter performed left lower extremity angiography initially with CO2 followed by complete angiography with contrast.  With the above findings we elected to intervene.  Long 6 French sheath was placed in the left SFA patient was fully heparinized.  Lesion was crossed with a Glidewire quick cross catheter confirmed intraluminal access.  We exchanged for a Viper wire with an 017 tip placed a Nava 6 to the level of the knee.  CSI atherectomy was performed of the SFA popliteal.  This was postdilated with 4 mm balloon.  Angiography demonstrated minimal residual dissection.  4 mm drug-coated balloon was used distally initially at 6 atm followed by 3 atm for 1 minute.  Completion demonstrated no residual stenosis where previously was occluded.  There is minimal dissection that is non-flow-limiting.  We then retrieved the filter.  We exchanged for short 6 French sheath deployed minx device in the right common femoral artery.  Patient was given Plavix 300 mg on table.  She tolerated procedure well immediate complication.   Contrast: 45cc  Getsemani Lindon C. Donzetta Matters, MD [facility] of [place] Office: [phone] Pager: [phone]

## 2018-12-30 ENCOUNTER — Encounter (HOSPITAL_COMMUNITY): Payer: Self-pay | Admitting: Vascular Surgery

## 2019-01-20 ENCOUNTER — Encounter: Payer: Self-pay | Admitting: Osteopathic Medicine

## 2019-01-20 ENCOUNTER — Ambulatory Visit (INDEPENDENT_AMBULATORY_CARE_PROVIDER_SITE_OTHER): Payer: Medicare HMO | Admitting: Osteopathic Medicine

## 2019-01-20 ENCOUNTER — Other Ambulatory Visit: Payer: Self-pay

## 2019-01-20 VITALS — BP 138/80 | HR 75 | Temp 98.3°F | Wt 146.1 lb

## 2019-01-20 DIAGNOSIS — E039 Hypothyroidism, unspecified: Secondary | ICD-10-CM

## 2019-01-20 DIAGNOSIS — Z23 Encounter for immunization: Secondary | ICD-10-CM

## 2019-01-20 DIAGNOSIS — N184 Chronic kidney disease, stage 4 (severe): Secondary | ICD-10-CM | POA: Diagnosis not present

## 2019-01-20 DIAGNOSIS — Z638 Other specified problems related to primary support group: Secondary | ICD-10-CM | POA: Diagnosis not present

## 2019-01-20 DIAGNOSIS — R195 Other fecal abnormalities: Secondary | ICD-10-CM

## 2019-01-20 DIAGNOSIS — E038 Other specified hypothyroidism: Secondary | ICD-10-CM

## 2019-01-20 NOTE — Progress Notes (Signed)
HPI: Laura Ellis is a 85 y.o. female who  has a past medical history of Arthritis, Chronic low back pain (05/26/2015), CKD (chronic kidney disease) stage 4, GFR 15-29 ml/min (HCC) (02/23/2016), Essential hypertension (05/26/2015), Glaucoma, Hypertension, Hypothyroidism (07/05/2015), Osteopenia, Osteoporosis (05/26/2015), and Spinal stenosis of lumbar region at multiple levels (03/13/2018).  she presents to [facility] today, 01/20/19,  for chief complaint of:  Follow up thyroid Family issues  Patient due for thyroid check.  We had started low-dose of levothyroxine given concern for symptomatic subclinical hypothyroidism.  Patient states that she is feeling more energetic and mentally sharp.  Still having times of forgetfulness.  Family issues: She has 2 daughters and 1 son.  Her one daughter is here, has been staying, is from [place].  Will be going back to [place] shortly and is concerned about her mother's living situation.  Her other sister has an apartment of her own but has sublet it and is now living with Laura Ellis.  She is frequently using alcohol and has occasional verbal outbursts, sometimes directed at her mother, for little things like leaving sugar sweetener packets out.  Laura Ellis just ignores these outbursts, her other daughter who accompanies her today has some concerns about "bottling up" these feelings/frustrations.  Inquires about possibly getting a Education officer, museum out to the house to evaluate.  Laura Ellis feels safe, she does not feel threatened by the other daughter.   Patient has had a couple vascular procedures since her last visit with me.  She was started on Plavix, she thinks that this medication has been causing some abdominal bloating as well as increased frequency of bowel movements, particularly after meals.  No watery stool, black stool, bloody stool, or overt diarrhea, is mostly formed but occasionally loose.      At today's visit  01/20/19 ... PMH, PSH, FH reviewed and updated as needed.  Current medication list and allergy/intolerance hx reviewed and updated as needed. (See remainder of HPI, ROS, Phys Exam below)          ASSESSMENT/PLAN: The primary encounter diagnosis was Subclinical hypothyroidism. Diagnoses of Need for influenza vaccination, CKD (chronic kidney disease) stage 4, GFR 15-29 ml/min (HCC), Family discord, and Loose stools were also pertinent to this visit.   Advised that unless they are seeking some kind of restraining order or there is a legal issue with the other daughter or danger to Ms Ellis, I do not think involving social worker/APS is necessary.  Patient feels safe and seems content to ignore outbursts from her daughter, while this is not necessarily the healthiest coping mechanism it seems to be working for her.  We will check TSH, if concern for hyperthyroidism due to medications would certainly stop this given the loose stools she is experiencing.  Otherwise, recommended increase fiber intake, try Imodium before meals.  Consider irritable bowel syndrome/diarrhea.  Orders Placed This Encounter  Procedures  . Flu Vaccine QUAD High Dose(Fluad)  . BASIC METABOLIC PANEL WITH GFR  . TSH        Follow-up plan: Return in about 4 months (around 05/22/2019) for 0.                                                 ################################################# ################################################# ################################################# #################################################    No outpatient medications have been marked as taking  for the 01/20/19 encounter (Office Visit) with Emeterio Reeve, DO.    Allergies  Allergen Reactions  . Alendronate Sodium     Esophagitis   . Ace Inhibitors     Cough  . Ezetimibe Other (See Comments)    Unknown rxn  . Fenofibrate     Arthralgia   . Rosuvastatin  Calcium     Muscle aches  . Statins Other (See Comments)    Muscle aches       Review of Systems:  Constitutional: No recent illness  HEENT: No  headache, no vision change  Cardiac: No  chest pain, No  pressure, No palpitations  Respiratory:  No  shortness of breath. No  Cough  Gastrointestinal: No  abdominal pain, +bloating, +change on bowel habits as per HPI, no nausea/vomiting.   Musculoskeletal: No new myalgia/arthralgia  Skin: No  Rash  Neurologic: No  weakness, No  Dizziness  Psychiatric: No  concerns with depression, No  concerns with anxiety  Exam:  BP 138/80 (BP Location: Left Arm, Patient Position: Sitting, Cuff Size: Normal)   Pulse 75   Temp 98.3 F (36.8 C) (Oral)   Wt 146 lb 1.9 oz (66.3 kg)   BMI 27.61 kg/m   Constitutional: VS see above. General Appearance: alert, well-developed, well-nourished, NAD  Eyes: Normal lids and conjunctive, non-icteric sclera  Neck: No masses, trachea midline.   Respiratory: Normal respiratory effort. no wheeze, no rhonchi, no rales  Cardiovascular: S1/S2 normal, +murmur, no rub/gallop auscultated. RRR.   Musculoskeletal: Gait normal.   Abdominal: non-tender, non-distended, no appreciable organomegaly, neg Murphy's, BS [id number]  Neurological: Normal balance/coordination. No tremor.  Skin: warm, dry, intact.   Psychiatric: Normal judgment/insight. Normal mood and affect. Oriented x3.       Visit summary with medication list and pertinent instructions was printed for patient to review, patient was advised to alert [place] if any updates are needed. All questions at time of visit were answered - patient instructed to contact office with any additional concerns. ER/RTC precautions were reviewed with the patient and understanding verbalized.   Note: Total time spent 40 minutes, greater than 50% of the visit was spent face-to-face counseling and coordinating care for the following: The primary encounter diagnosis was  Subclinical hypothyroidism. Diagnoses of Need for influenza vaccination, CKD (chronic kidney disease) stage 4, GFR 15-29 ml/min (HCC), Family discord, and Loose stools were also pertinent to this visit.[name]  Please note: voice recognition software was used to produce this document, and typos may escape review. Please contact Dr. Sheppard Coil for any needed clarifications.    Follow up plan: Return in about 4 months (around 05/22/2019) for 0.

## 2019-01-21 ENCOUNTER — Other Ambulatory Visit: Payer: Self-pay | Admitting: Osteopathic Medicine

## 2019-01-21 LAB — BASIC METABOLIC PANEL WITH GFR
BUN/Creatinine Ratio: 15 (calc) (ref 6–22)
BUN: 26 mg/dL — ABNORMAL HIGH (ref 7–25)
CO2: 26 mmol/L (ref 20–32)
Calcium: 9.9 mg/dL (ref 8.6–10.4)
Chloride: 106 mmol/L (ref 98–110)
Creat: 1.7 mg/dL — ABNORMAL HIGH (ref 0.60–0.88)
GFR, Est African American: 32 mL/min/{1.73_m2} — ABNORMAL LOW (ref >=60)
GFR, Est Non African American: 27 mL/min/{1.73_m2} — ABNORMAL LOW (ref >=60)
Glucose, Bld: 88 mg/dL (ref 65–99)
Potassium: 5 mmol/L (ref 3.5–5.3)
Sodium: 142 mmol/L (ref 135–146)

## 2019-01-21 LAB — TSH: TSH: 2.39 mIU/L (ref 0.40–4.50)

## 2019-01-21 MED ORDER — LEVOTHYROXINE SODIUM 50 MCG PO TABS: 50.0000 ug | ORAL_TABLET | Freq: Every day | ORAL | 1 refills | Status: DC

## 2019-02-02 ENCOUNTER — Other Ambulatory Visit: Payer: Self-pay

## 2019-02-02 DIAGNOSIS — I739 Peripheral vascular disease, unspecified: Secondary | ICD-10-CM

## 2019-02-03 ENCOUNTER — Ambulatory Visit (INDEPENDENT_AMBULATORY_CARE_PROVIDER_SITE_OTHER)
Admission: RE | Admit: 2019-02-03 | Discharge: 2019-02-03 | Disposition: A | Discharge status: Home / Self Care | Payer: Medicare HMO | Source: Ambulatory Visit | Attending: Vascular Surgery | Admitting: Vascular Surgery

## 2019-02-03 ENCOUNTER — Other Ambulatory Visit: Payer: Self-pay

## 2019-02-03 ENCOUNTER — Ambulatory Visit (HOSPITAL_COMMUNITY)
Admission: RE | Admit: 2019-02-03 | Discharge: 2019-02-03 | Disposition: A | Discharge status: Home / Self Care | Payer: Medicare HMO | Source: Ambulatory Visit | Attending: Vascular Surgery | Admitting: Vascular Surgery

## 2019-02-03 ENCOUNTER — Ambulatory Visit (INDEPENDENT_AMBULATORY_CARE_PROVIDER_SITE_OTHER): Payer: Medicare HMO | Admitting: Physician Assistant

## 2019-02-03 VITALS — BP 119/90 | HR 102 | Temp 97.8°F | Resp 12 | Ht 61.0 in | Wt 143.0 lb

## 2019-02-03 DIAGNOSIS — I739 Peripheral vascular disease, unspecified: Secondary | ICD-10-CM

## 2019-02-03 MED ORDER — ASPIRIN EC 81 MG PO TBEC: 81.0000 mg | DELAYED_RELEASE_TABLET | Freq: Every day | ORAL | Status: DC

## 2019-02-03 NOTE — Progress Notes (Signed)
Postoperative Visit (Angio)   History of Present Illness   Laura Ellis is a 85 y.o. female who returns to clinic to go over vascular studies.  She is 5 weeks status post left SFA and popliteal atherectomy and angioplasty by Dr. Donzetta Matters on 12/25/2018.  This was performed due to rest pain left foot.  Patient states and daughter agrees that she is much more active since the procedure.  She denies any further rest pain and also denies any claudication.  She is taking her Plavix daily however feels like this is causing her GI upset and bloating.  She has an allergy to statin however is on red yeast rice.  Patient has also been evaluated in the past for venous insufficiency.  She has a history of right greater saphenous vein ablation due to venous ulcerations which have since healed.  She has noticed more swelling in her left lower extremity however admittedly is not wearing compression or elevating her legs during the day.  Current Outpatient Medications  Medication Sig Dispense Refill  . amLODipine (NORVASC) 5 MG tablet Take 1 tablet (5 mg total) by mouth daily. 90 tablet 1  . Ascorbic Acid (VITAMIN C) 1000 MG tablet Take 1,000 mg by mouth daily.    . clopidogrel (PLAVIX) 75 MG tablet Take 1 tablet (75 mg total) by mouth daily. 30 tablet 11  . Coenzyme Q10 (COQ10) 100 MG CAPS Take 100 mg by mouth daily.     . Cyanocobalamin (VITAMIN B-12 PO) Take 1 tablet by mouth every other day.     . levothyroxine (SYNTHROID) 50 MCG tablet Take 1 tablet (50 mcg total) by mouth daily. 90 tablet 1  . losartan (COZAAR) 25 MG tablet Take 25 mg by mouth daily.    . Multiple Vitamin (MULTIVITAMIN WITH MINERALS) TABS tablet Take 1 tablet by mouth daily.    . pantoprazole (PROTONIX) 20 MG tablet Take 1 tablet (20 mg total) by mouth daily. 30 tablet 11  . Red Yeast Rice 600 MG CAPS Take 600 mg by mouth daily.     . timolol (TIMOPTIC) 0.25 % ophthalmic solution Place 1 drop into both eyes 2 (two) times daily.     .  Urea 40 % GEL Apply to abnormal nail/skin as directed 25 mL 1   No current facility-administered medications for this visit.     ROS: 10 system ROS is negative unless otherwise noted in HPI   For VQI Use Only   PRE-ADM LIVING: Home  AMB STATUS: Ambulatory   Physical Examination   Vitals:   02/03/19 1512  BP: 119/90  Pulse: (!) 102  Resp: 12  Temp: 97.8 F (36.6 C)  TempSrc: Temporal  SpO2: 97%  Weight: 143 lb (64.9 kg)  Height: 5\' 1"  (1.549 m)   Body mass index is 27.02 kg/m.  General Alert, O x 3, WD, NAD  Pulmonary Sym exp, good B air movt, CTA B  Cardiac RRR, Nl S1, S2,  Vascular Vessel Right Left  Radial Palpable Palpable  Brachial Palpable Palpable  PT Not palpable Not palpable  DP Not palpable Not palpable     Gastrointestinal  right groin without palpable hematoma or pseudoaneurysm soft, non-distended, non-tender to palpation,   Musculoskeletal M/S 5/5 throughout  , Extremities without ischemic changes  , Pitting edema left lower extremity to the level of the mid shin,  Neurologic  Pain and light touch intact in extremities , Motor exam as listed above    Medical Decision  Making   Laura Ellis is a 85 y.o. female who presents s/p atherectomy of left SFA and popliteal artery with angioplasty by Dr. Donzetta Matters 5 weeks postop.   Arterial duplex demonstrates patent left SFA and popliteal artery with an ABI improved from 0.2-0.8  Due to GI upset with Plavix we will now transition to 81 mg of aspirin daily  Encouraged elevation and compression bilateral lower extremities  Continue red yeast rice due to statin allergy  Follow-up in office in 6 months for repeat arterial duplex and ABIs   Dagoberto Ligas PA-C [facility] Office: [phone]  Clinic MD: Dr. Carlis Abbott

## 2019-02-18 DIAGNOSIS — R7989 Other specified abnormal findings of blood chemistry: Secondary | ICD-10-CM | POA: Insufficient documentation

## 2019-03-11 ENCOUNTER — Telehealth: Payer: Self-pay | Admitting: Osteopathic Medicine

## 2019-03-11 DIAGNOSIS — Z9189 Other specified personal risk factors, not elsewhere classified: Secondary | ICD-10-CM

## 2019-03-11 DIAGNOSIS — G8929 Other chronic pain: Secondary | ICD-10-CM

## 2019-03-11 DIAGNOSIS — M25572 Pain in left ankle and joints of left foot: Secondary | ICD-10-CM

## 2019-03-11 NOTE — Telephone Encounter (Signed)
Done - CF °

## 2019-03-11 NOTE — Telephone Encounter (Signed)
Patient called, stating that she needed a referral to her Podiatrist, stating that her insurance with [facility] requires this referral.  Podiatrist : Dr. Lindley [facility] [address], [place], [place]

## 2019-03-11 NOTE — Addendum Note (Signed)
Addended by: Mertha Finders on: 03/11/2019 08:06 AM   Modules accepted: Orders

## 2019-03-17 ENCOUNTER — Telehealth: Payer: Self-pay | Admitting: *Deleted

## 2019-03-17 NOTE — Telephone Encounter (Signed)
Returning Laura Ellis telephone voice message. Was unable to speak with patient.   Left telephone voice message for her to call me back.

## 2019-03-17 NOTE — Telephone Encounter (Signed)
Laura Ellis is calling regarding measures to take to prevent reocurence of venous stasis ulcer.  She has history of venous ulcer that has healed in the winter of 2019.  Advised her to keep her legs elevated when sitting, wear her knee high compression hose 20-30 mm HG during daytime, and avoid prolonged standing and sitting.   Laura Ellis verbalized understanding of instructions.

## 2019-03-24 DIAGNOSIS — R7989 Other specified abnormal findings of blood chemistry: Secondary | ICD-10-CM | POA: Insufficient documentation

## 2019-03-24 DIAGNOSIS — R748 Abnormal levels of other serum enzymes: Secondary | ICD-10-CM | POA: Insufficient documentation

## 2019-04-02 DIAGNOSIS — B351 Tinea unguium: Secondary | ICD-10-CM | POA: Diagnosis not present

## 2019-04-02 DIAGNOSIS — M79671 Pain in right foot: Secondary | ICD-10-CM | POA: Diagnosis not present

## 2019-04-14 ENCOUNTER — Other Ambulatory Visit: Payer: Self-pay | Admitting: Osteopathic Medicine

## 2019-04-15 DIAGNOSIS — N184 Chronic kidney disease, stage 4 (severe): Secondary | ICD-10-CM | POA: Diagnosis not present

## 2019-04-15 DIAGNOSIS — E039 Hypothyroidism, unspecified: Secondary | ICD-10-CM | POA: Diagnosis not present

## 2019-04-15 DIAGNOSIS — I129 Hypertensive chronic kidney disease with stage 1 through stage 4 chronic kidney disease, or unspecified chronic kidney disease: Secondary | ICD-10-CM | POA: Diagnosis not present

## 2019-04-15 DIAGNOSIS — I739 Peripheral vascular disease, unspecified: Secondary | ICD-10-CM | POA: Diagnosis not present

## 2019-05-11 ENCOUNTER — Telehealth: Payer: Self-pay

## 2019-05-11 NOTE — Telephone Encounter (Signed)
Pt left a vm msg stating she has been having episodes of dizziness. As per pt, she has a long history of suffering from dizziness. However she stated that current dizzy spell has been going on for about 4 - 5 weeks. She is also requesting a low dose fof valium to help her with the dizziness. Pt does have an upcoming appt scheduled for 05/27/18, but does not want to wait until that appt for evaluation. Pt was contacted and transferred to [facility] to change upcoming appt to an acute visit. Pt was agreeable with plan. No other inquiries during call.

## 2019-05-12 ENCOUNTER — Encounter: Payer: Self-pay | Admitting: Osteopathic Medicine

## 2019-05-12 ENCOUNTER — Other Ambulatory Visit: Payer: Self-pay

## 2019-05-12 ENCOUNTER — Ambulatory Visit (INDEPENDENT_AMBULATORY_CARE_PROVIDER_SITE_OTHER): Payer: Medicare HMO | Admitting: Osteopathic Medicine

## 2019-05-12 VITALS — BP 148/81 | HR 82 | Temp 97.6°F | Wt 149.0 lb

## 2019-05-12 DIAGNOSIS — R42 Dizziness and giddiness: Secondary | ICD-10-CM | POA: Diagnosis not present

## 2019-05-12 MED ORDER — DIAZEPAM 2 MG PO TABS: 1.0000 mg | ORAL_TABLET | Freq: Three times a day (TID) | ORAL | 0 refills | Status: DC | PRN

## 2019-05-12 NOTE — Patient Instructions (Signed)
Plan:  Valium as needed for anxiety/vertigo/dizziness  If dizziness is worse, I will insist on physical therapy!   I'd continue the blood pressure medicines, I don't think your BP dropping is what's causing this issue      Benign Positional Vertigo Vertigo is the feeling that you or your surroundings are moving when they are not. Benign positional vertigo is the most common form of vertigo. This is usually a harmless condition (benign). This condition is positional. This means that symptoms are triggered by certain movements and positions. This condition can be dangerous if it occurs while you are doing something that could cause harm to you or others. This includes activities such as driving or operating machinery. What are the causes? In many cases, the cause of this condition is not known. It may be caused by a disturbance in an area of the inner ear that helps your brain to sense movement and balance. This disturbance can be caused by:  Viral infection (labyrinthitis).  Head injury.  Repetitive motion, such as jumping, dancing, or running. What increases the risk? You are more likely to develop this condition if:  You are a woman.  You are 84 years of age or older. What are the signs or symptoms? Symptoms of this condition usually happen when you move your head or your eyes in different directions. Symptoms may start suddenly, and usually last for less than a minute. They include:  Loss of balance and falling.  Feeling like you are spinning or moving.  Feeling like your surroundings are spinning or moving.  Nausea and vomiting.  Blurred vision.  Dizziness.  Involuntary eye movement (nystagmus). Symptoms can be mild and cause only minor problems, or they can be severe and interfere with daily life. Episodes of benign positional vertigo may return (recur) over time. Symptoms may improve over time. How is this diagnosed? This condition may be diagnosed based on:  Your  medical history.  Physical exam of the head, neck, and ears.  Tests, such as: ? MRI. ? CT scan. ? Eye movement tests. Your health care provider may ask you to change positions quickly while he or she watches you for symptoms of benign positional vertigo, such as nystagmus. Eye movement may be tested with a variety of exams that are designed to evaluate or stimulate vertigo. ? An electroencephalogram (EEG). This records electrical activity in your brain. ? Hearing tests. You may be referred to a health care provider who specializes in ear, nose, and throat (ENT) problems (otolaryngologist) or a provider who specializes in disorders of the nervous system (neurologist). How is this treated?  This condition may be treated in a session in which your health care provider moves your head in specific positions to adjust your inner ear back to normal. Treatment for this condition may take several sessions. Surgery may be needed in severe cases, but this is rare. In some cases, benign positional vertigo may resolve on its own in 2-4 weeks. Follow these instructions at home: Safety  Move slowly. Avoid sudden body or head movements or certain positions, as told by your health care provider.  Avoid driving until your health care provider says it is safe for you to do so.  Avoid operating heavy machinery until your health care provider says it is safe for you to do so.  Avoid doing any tasks that would be dangerous to you or others if vertigo occurs.  If you have trouble walking or keeping your balance, try using a  cane for stability. If you feel dizzy or unstable, sit down right away.  Return to your normal activities as told by your health care provider. Ask your health care provider what activities are safe for you. General instructions  Take over-the-counter and prescription medicines only as told by your health care provider.  Drink enough fluid to keep your urine pale yellow.  Keep all  follow-up visits as told by your health care provider. This is important. Contact a health care provider if:  You have a fever.  Your condition gets worse or you develop new symptoms.  Your family or friends notice any behavioral changes.  You have nausea or vomiting that gets worse.  You have numbness or a "pins and needles" sensation. Get help right away if you:  Have difficulty speaking or moving.  Are always dizzy.  Faint.  Develop severe headaches.  Have weakness in your legs or arms.  Have changes in your hearing or vision.  Develop a stiff neck.  Develop sensitivity to light. Summary  Vertigo is the feeling that you or your surroundings are moving when they are not. Benign positional vertigo is the most common form of vertigo.  The cause of this condition is not known. It may be caused by a disturbance in an area of the inner ear that helps your brain to sense movement and balance.  Symptoms include loss of balance and falling, feeling that you or your surroundings are moving, nausea and vomiting, and blurred vision.  This condition can be diagnosed based on symptoms, physical exam, and other tests, such as MRI, CT scan, eye movement tests, and hearing tests.  Follow safety instructions as told by your health care provider. You will also be told when to contact your health care provider in case of problems. This information is not intended to replace advice given to you by your health care provider. Make sure you discuss any questions you have with your health care provider. Document Released: 02/05/2006 Document Revised: 10/09/2017 Document Reviewed: 10/09/2017 Elsevier Patient Education  2020 [employer].

## 2019-05-12 NOTE — Progress Notes (Signed)
HPI: Laura Ellis is a 85 y.o. female who  has a past medical history of Arthritis, Chronic low back pain (05/26/2015), CKD (chronic kidney disease) stage 4, GFR 15-29 ml/min (HCC) (02/23/2016), Essential hypertension (05/26/2015), Glaucoma, Hypertension, Hypothyroidism (07/05/2015), Osteopenia, Osteoporosis (05/26/2015), and Spinal stenosis of lumbar region at multiple levels (03/13/2018).  she presents to [facility], 05/12/19,  for chief complaint of:  Dizziness   Called yesterday to request valium Rx for persistent dizziness.  Per patient/daughter, they attribute the dizziness to stress/migraines, patient has a history of migraines though she denies headache or vision change or nausea/phonophobia/photophobia with her dizziness symptoms.  Reports will have episodes throughout the day, mostly with change in position, seems to be exacerbated by stress which patient is experiencing more frequently lately given the death of her husband and that she is managing the household/finances, etc.  We did an MRI earlier this year, the patient's other daughter was under the impression that some sort of calcification at the base of the skull was blocking fluid, patient was told something similar by a chiropractor at one point many years ago when she saw them for this dizziness.  I reviewed the MRI images and report with the patient and her daughter today, see assessment/plan below.  Lying down, BP is elevated, orthostatics are negative, though patient is symptomatic w/ unsteadiness going from lying to sitting and from sitting to standing   Current antihypertensive medications: Losartan 25 mg and 50 mg from different pharmacies, pt reports she is taking 25 mg, but she has the 50 mg and is NOT taking this. Also on Amlodipine 5 mg.  Stopped BP meds a few days ago d/t concern that meds might be the cause  Associated symptoms: Per daughter, memory is getting worse.      Patient is accompanied by daughter who assists with history-taking.    At today's visit 05/12/19 ... PMH, PSH, FH reviewed and updated as needed.  Current medication list and allergy/intolerance hx reviewed and updated as needed. (See remainder of HPI, ROS, Phys Exam below)   No results found.  No results found for this or any previous visit (from the past 72 hour(s)).        ASSESSMENT/PLAN: The encounter diagnosis was Vertigo.  Explained to patient/daughter that her symptoms are consistent with BPPV, probably exacerbated by stress, does not really seem consistent with migraines, does not really seem consistent with orthostatic hypotension though her symptoms are definitely worse with position changes, may be some sort of orthostatic/autonomic component.  At any rate, patient is reluctant to pursue vestibular physical therapy, I have agreed to trial low-dose Valium to take 1-2 times per week as needed, but will insist on physical therapy if this does not seem to be controlling symptoms, patient would also probably benefit from SSRI but will address this at later visit/if needed  No orders of the defined types were placed in this encounter.    Meds ordered this encounter  Medications  . diazepam (VALIUM) 2 MG tablet    Sig: Take 0.5-1 tablets (1-2 mg total) by mouth every 8 (eight) hours as needed (dizziness/severe anxiety). #30 for 90 days    Dispense:  30 tablet    Refill:  0      Patient Instructions  Plan:  Valium as needed for anxiety/vertigo/dizziness  If dizziness is worse, I will insist on physical therapy!   I'd continue the blood pressure medicines, I don't think your BP dropping is  what's causing this issue      Benign Positional Vertigo Vertigo is the feeling that you or your surroundings are moving when they are not. Benign positional vertigo is the most common form of vertigo. This is usually a harmless condition (benign). This condition is  positional. This means that symptoms are triggered by certain movements and positions. This condition can be dangerous if it occurs while you are doing something that could cause harm to you or others. This includes activities such as driving or operating machinery. What are the causes? In many cases, the cause of this condition is not known. It may be caused by a disturbance in an area of the inner ear that helps your brain to sense movement and balance. This disturbance can be caused by:  Viral infection (labyrinthitis).  Head injury.  Repetitive motion, such as jumping, dancing, or running. What increases the risk? You are more likely to develop this condition if:  You are a woman.  You are 13 years of age or older. What are the signs or symptoms? Symptoms of this condition usually happen when you move your head or your eyes in different directions. Symptoms may start suddenly, and usually last for less than a minute. They include:  Loss of balance and falling.  Feeling like you are spinning or moving.  Feeling like your surroundings are spinning or moving.  Nausea and vomiting.  Blurred vision.  Dizziness.  Involuntary eye movement (nystagmus). Symptoms can be mild and cause only minor problems, or they can be severe and interfere with daily life. Episodes of benign positional vertigo may return (recur) over time. Symptoms may improve over time. How is this diagnosed? This condition may be diagnosed based on:  Your medical history.  Physical exam of the head, neck, and ears.  Tests, such as: ? MRI. ? CT scan. ? Eye movement tests. Your health care provider may ask you to change positions quickly while he or she watches you for symptoms of benign positional vertigo, such as nystagmus. Eye movement may be tested with a variety of exams that are designed to evaluate or stimulate vertigo. ? An electroencephalogram (EEG). This records electrical activity in your  brain. ? Hearing tests. You may be referred to a health care provider who specializes in ear, nose, and throat (ENT) problems (otolaryngologist) or a provider who specializes in disorders of the nervous system (neurologist). How is this treated?  This condition may be treated in a session in which your health care provider moves your head in specific positions to adjust your inner ear back to normal. Treatment for this condition may take several sessions. Surgery may be needed in severe cases, but this is rare. In some cases, benign positional vertigo may resolve on its own in 2-4 weeks. Follow these instructions at home: Safety  Move slowly. Avoid sudden body or head movements or certain positions, as told by your health care provider.  Avoid driving until your health care provider says it is safe for you to do so.  Avoid operating heavy machinery until your health care provider says it is safe for you to do so.  Avoid doing any tasks that would be dangerous to you or others if vertigo occurs.  If you have trouble walking or keeping your balance, try using a cane for stability. If you feel dizzy or unstable, sit down right away.  Return to your normal activities as told by your health care provider. Ask your health care provider what  activities are safe for you. General instructions  Take over-the-counter and prescription medicines only as told by your health care provider.  Drink enough fluid to keep your urine pale yellow.  Keep all follow-up visits as told by your health care provider. This is important. Contact a health care provider if:  You have a fever.  Your condition gets worse or you develop new symptoms.  Your family or friends notice any behavioral changes.  You have nausea or vomiting that gets worse.  You have numbness or a "pins and needles" sensation. Get help right away if you:  Have difficulty speaking or moving.  Are always dizzy.  Faint.  Develop  severe headaches.  Have weakness in your legs or arms.  Have changes in your hearing or vision.  Develop a stiff neck.  Develop sensitivity to light. Summary  Vertigo is the feeling that you or your surroundings are moving when they are not. Benign positional vertigo is the most common form of vertigo.  The cause of this condition is not known. It may be caused by a disturbance in an area of the inner ear that helps your brain to sense movement and balance.  Symptoms include loss of balance and falling, feeling that you or your surroundings are moving, nausea and vomiting, and blurred vision.  This condition can be diagnosed based on symptoms, physical exam, and other tests, such as MRI, CT scan, eye movement tests, and hearing tests.  Follow safety instructions as told by your health care provider. You will also be told when to contact your health care provider in case of problems. This information is not intended to replace advice given to you by your health care provider. Make sure you discuss any questions you have with your health care provider. Document Released: 02/05/2006 Document Revised: 10/09/2017 Document Reviewed: 10/09/2017 Elsevier Patient Education  2020 [employer].      Follow-up plan: No follow-ups on file.                                                 ################################################# ################################################# ################################################# #################################################    No outpatient medications have been marked as taking for the 05/12/19 encounter (Appointment) with Emeterio Reeve, DO.    Allergies  Allergen Reactions  . Alendronate Sodium     Esophagitis   . Ace Inhibitors     Cough  . Ezetimibe Other (See Comments)    Unknown rxn  . Fenofibrate     Arthralgia   . Rosuvastatin Calcium     Muscle aches  .  Statins Other (See Comments)    Muscle aches       Review of Systems:  Constitutional: No recent illness  HEENT: No  headache, no vision change  Cardiac: No  chest pain, No  pressure, No palpitations  Respiratory:  No  shortness of breath. No  Cough  Neurologic: No  weakness, +Dizziness  Psychiatric: No  concerns with depression, +concerns with anxiety  Exam:  BP (!) 148/81 (BP Location: Left Arm, Patient Position: Sitting, Cuff Size: Normal)   Pulse 82   Temp 97.6 F (36.4 C) (Oral)   Wt 149 lb 0.6 oz (67.6 kg)   BMI 28.16 kg/m   Constitutional: VS see above. General Appearance: alert, well-developed, well-nourished, NAD  Eyes: Normal lids and conjunctive, non-icteric sclera  Neck: No masses, trachea  midline.   Respiratory: Normal respiratory effort. no wheeze, no rhonchi, no rales  Cardiovascular: S1/S2 normal, no murmur, no rub/gallop auscultated. RRR.   Musculoskeletal: Gait normal. Symmetric and independent movement of all extremities  Neurological: Normal balance/coordination. No tremor. EOMI, pERRL   Skin: warm, dry, intact.   Psychiatric: Normal judgment/insight. Normal mood and affect. Oriented x3.       Visit summary with medication list and pertinent instructions was printed for patient to review, patient was advised to alert [place] if any updates are needed. All questions at time of visit were answered - patient instructed to contact office with any additional concerns. ER/RTC precautions were reviewed with the patient and understanding verbalized.   Note: Total time spent 40 minutes, greater than 50% of the visit was spent face-to-face counseling and coordinating care for the following: The encounter diagnosis was Vertigo..  Please note: voice recognition software was used to produce this document, and typos may escape review. Please contact Dr. Sheppard Coil for any needed clarifications.    Follow up plan: Return if symptoms worsen or fail to  improve.

## 2019-05-21 ENCOUNTER — Ambulatory Visit: Payer: Medicare HMO | Admitting: Osteopathic Medicine

## 2019-05-28 ENCOUNTER — Ambulatory Visit: Payer: Medicare HMO | Admitting: Osteopathic Medicine

## 2019-05-28 DIAGNOSIS — L84 Corns and callosities: Secondary | ICD-10-CM | POA: Diagnosis not present

## 2019-05-28 DIAGNOSIS — I70203 Unspecified atherosclerosis of native arteries of extremities, bilateral legs: Secondary | ICD-10-CM | POA: Diagnosis not present

## 2019-05-28 DIAGNOSIS — M79676 Pain in unspecified toe(s): Secondary | ICD-10-CM | POA: Diagnosis not present

## 2019-05-28 DIAGNOSIS — B351 Tinea unguium: Secondary | ICD-10-CM | POA: Diagnosis not present

## 2019-06-01 ENCOUNTER — Other Ambulatory Visit: Payer: Self-pay | Admitting: Osteopathic Medicine

## 2019-06-09 ENCOUNTER — Ambulatory Visit: Payer: Medicare HMO | Admitting: Osteopathic Medicine

## 2019-06-15 ENCOUNTER — Encounter: Payer: Self-pay | Admitting: Osteopathic Medicine

## 2019-06-15 ENCOUNTER — Ambulatory Visit (INDEPENDENT_AMBULATORY_CARE_PROVIDER_SITE_OTHER): Payer: Medicare HMO | Admitting: Osteopathic Medicine

## 2019-06-15 ENCOUNTER — Other Ambulatory Visit: Payer: Self-pay

## 2019-06-15 VITALS — BP 146/79 | HR 76 | Temp 97.8°F | Wt 150.1 lb

## 2019-06-15 DIAGNOSIS — F338 Other recurrent depressive disorders: Secondary | ICD-10-CM

## 2019-06-15 DIAGNOSIS — R42 Dizziness and giddiness: Secondary | ICD-10-CM

## 2019-06-15 MED ORDER — ESCITALOPRAM OXALATE 5 MG PO TABS: 5.0000 mg | ORAL_TABLET | Freq: Every day | ORAL | 1 refills | Status: DC

## 2019-06-15 NOTE — Progress Notes (Signed)
Laura Ellis is a 86 y.o. female who presents to  [place] at [facility]  today, 06/16/19, seeking care for the following:  The primary encounter diagnosis was Seasonal affective disorder ([place]). A diagnosis of Vertigo was also pertinent to this visit.  ASSESSMENT & PLAN with other pertinent history/findings:  1. Seasonal affective disorder ([place]) Moods a bit low lately Confusion on occasion, no worse than usual Pt would like to trial restarting Lexapro  No overt dementia but probable cognitive decline   2. Vertigo Occasional headaches, pt not drinking much water in evenings, having more dizziness and occ night time headaches, advised increase hydration and will follow up on the issue in 1 week, consider MRI / neurology referral if not better, pt to alert [place] if worse      No orders of the defined types were placed in this encounter.   Meds ordered this encounter  Medications  . escitalopram (LEXAPRO) 5 MG tablet    Sig: Take 1 tablet (5 mg total) by mouth at bedtime.    Dispense:  90 tablet    Refill:  1    Patient Instructions  Plan:  Will restart Lexapro for moods, this might help as well with memory/energy levels. I think you have some seasonal affective disorder, less likely to be dementia or related severe memory problems, but I think seeing a neurologist would be a good idea if this and the dizziness/headache doesn't improve.   Will call you in a week to check in - might have you see a neurologist!       Follow-up instructions: Return in about 4 months (around 10/13/2019) for [facility] (call week prior to visit for lab orders).                         BP (!) 146/79 (BP Location: Left Arm, Patient Position: Sitting, Cuff Size: Normal)   Pulse 76   Temp 97.8 F (36.6 C) (Oral)   Wt 150 lb 1.9 oz (68.1 kg)   BMI 28.36 kg/m   Current Meds  Medication Sig  . amLODipine (NORVASC) 5 MG tablet TAKE  ONE TABLET BY MOUTH DAILY  . Ascorbic Acid (VITAMIN C) 1000 MG tablet Take 1,000 mg by mouth daily.  [name] aspirin EC 81 MG tablet Take 1 tablet (81 mg total) by mouth daily.  . Coenzyme Q10 (COQ10) 100 MG CAPS Take 100 mg by mouth daily.   . Cyanocobalamin (VITAMIN B-12 PO) Take 1 tablet by mouth every other day.   . diazepam (VALIUM) 2 MG tablet Take 0.5-1 tablets (1-2 mg total) by mouth every 8 (eight) hours as needed (dizziness/severe anxiety). #30 for 90 days  . levothyroxine (SYNTHROID) 50 MCG tablet TAKE ONE TABLET BY MOUTH DAILY  . losartan (COZAAR) 25 MG tablet Take 25 mg by mouth daily.  . Multiple Vitamin (MULTIVITAMIN WITH MINERALS) TABS tablet Take 1 tablet by mouth daily.  . pantoprazole (PROTONIX) 20 MG tablet Take 1 tablet (20 mg total) by mouth daily.  . Red Yeast Rice 600 MG CAPS Take 600 mg by mouth daily.   . timolol (TIMOPTIC) 0.25 % ophthalmic solution Place 1 drop into both eyes 2 (two) times daily.   . Urea 40 % GEL Apply to abnormal nail/skin as directed    No results found for this or any previous visit (from the past 72 hour(s)).  No results found.  Depression screen [facility] 2/9 09/01/2018 01/16/2018  07/18/2017  Decreased Interest 0 1 0  Down, Depressed, Hopeless 0 0 0  PHQ - 2 Score 0 1 0  Altered sleeping - 0 0  Tired, decreased energy - 0 1  Change in appetite - 3 1  Feeling bad or failure about yourself  - 0 0  Trouble concentrating - 0 0  Moving slowly or fidgety/restless - 0 0  Suicidal thoughts - 0 0  PHQ-9 Score - 4 2  Difficult doing work/chores - Somewhat difficult Not difficult at all    GAD 7 : Generalized Anxiety Score 01/16/2018 07/18/2017  Nervous, Anxious, on Edge 0 0  Control/stop worrying 0 0  Worry too much - different things 0 0  Trouble relaxing 0 0  Restless 0 0  Easily annoyed or irritable 0 0  Afraid - awful might happen 0 0  Total GAD 7 Score 0 0  Anxiety Difficulty - Not difficult at all      All questions at time of visit were  answered - patient instructed to contact office with any additional concerns or updates.  ER/RTC precautions were reviewed with the patient.  Please note: voice recognition software was used to produce this document, and typos may escape review. Please contact Dr. Sheppard Coil for any needed clarifications.

## 2019-06-15 NOTE — Patient Instructions (Addendum)
Plan:  Will restart Lexapro for moods, this might help as well with memory/energy levels. I think you have some seasonal affective disorder, less likely to be dementia or related severe memory problems, but I think seeing a neurologist would be a good idea if this and the dizziness/headache doesn't improve.   Will call you in a week to check in - might have you see a neurologist!

## 2019-06-16 ENCOUNTER — Encounter: Payer: Self-pay | Admitting: Osteopathic Medicine

## 2019-06-23 ENCOUNTER — Telehealth: Payer: Self-pay | Admitting: Osteopathic Medicine

## 2019-06-23 NOTE — Telephone Encounter (Signed)
Left a message for a return call.

## 2019-06-23 NOTE — Telephone Encounter (Addendum)
-----   Message from Emeterio Reeve, DO sent at 06/16/2019  3:25 PM EST ----- Please call patient: Following up from her recent visit 06/16/19 Still having headache? If yes, does she want me to place order for Neurology referral? Thanks!

## 2019-06-24 NOTE — Telephone Encounter (Signed)
Pt return a call back regarding provider's inquiry. As per pt, no longer having ha's and buzzing sound has stopped. Pt states she is feeling great. She mentioned the medications are helping. No other concerns during call.

## 2019-07-12 ENCOUNTER — Ambulatory Visit: Payer: Medicare HMO | Attending: Internal Medicine

## 2019-07-12 DIAGNOSIS — Z23 Encounter for immunization: Secondary | ICD-10-CM | POA: Insufficient documentation

## 2019-07-12 NOTE — Progress Notes (Signed)
   Covid-19 Vaccination Clinic  Name:  [name]    MRN: [id number] DOB: 1932-06-27  07/12/2019  Laura Ellis was observed post Covid-19 immunization for 15 minutes without incidence. She was provided with Vaccine Information Sheet and instruction to access the V-Safe system.   Laura Ellis was instructed to call 911 with any severe reactions post vaccine: [name] Difficulty breathing  . Swelling of your face and throat  . A fast heartbeat  . A bad rash all over your body  . Dizziness and weakness    Immunizations Administered    Name Date Dose VIS Date Route   Pfizer COVID-19 Vaccine 07/12/2019 12:45 PM 0.3 mL 04/24/2019 Intramuscular   Manufacturer: [place]   Lot: [id number]   [place]: [id number]

## 2019-07-14 DIAGNOSIS — H401131 Primary open-angle glaucoma, bilateral, mild stage: Secondary | ICD-10-CM | POA: Diagnosis not present

## 2019-08-06 DIAGNOSIS — M79676 Pain in unspecified toe(s): Secondary | ICD-10-CM | POA: Diagnosis not present

## 2019-08-06 DIAGNOSIS — I70203 Unspecified atherosclerosis of native arteries of extremities, bilateral legs: Secondary | ICD-10-CM | POA: Diagnosis not present

## 2019-08-06 DIAGNOSIS — B351 Tinea unguium: Secondary | ICD-10-CM | POA: Diagnosis not present

## 2019-08-06 DIAGNOSIS — L84 Corns and callosities: Secondary | ICD-10-CM | POA: Diagnosis not present

## 2019-08-11 ENCOUNTER — Ambulatory Visit: Payer: Medicare HMO | Attending: Internal Medicine

## 2019-08-11 DIAGNOSIS — Z23 Encounter for immunization: Secondary | ICD-10-CM

## 2019-08-11 NOTE — Progress Notes (Signed)
   Covid-19 Vaccination Clinic  Name:  Laura Ellis    MRN: [id number] DOB: 1932/05/31  08/11/2019  Ms. Laura Ellis was observed post Covid-19 immunization for 15 minutes without incident. She was provided with Vaccine Information Sheet and instruction to access the V-Safe system.   Ms. Laura Ellis was instructed to call 911 with any severe reactions post vaccine: [name] Difficulty breathing  . Swelling of face and throat  . A fast heartbeat  . A bad rash all over body  . Dizziness and weakness   Immunizations Administered    Name Date Dose VIS Date Route   Pfizer COVID-19 Vaccine 08/11/2019  2:09 PM 0.3 mL 04/24/2019 Intramuscular   Manufacturer: [place]   Lot: [id number]   [place]: [id number]

## 2019-08-12 ENCOUNTER — Other Ambulatory Visit: Payer: Self-pay | Admitting: *Deleted

## 2019-08-12 DIAGNOSIS — I739 Peripheral vascular disease, unspecified: Secondary | ICD-10-CM

## 2019-08-13 ENCOUNTER — Other Ambulatory Visit: Payer: Self-pay

## 2019-08-13 ENCOUNTER — Ambulatory Visit (INDEPENDENT_AMBULATORY_CARE_PROVIDER_SITE_OTHER)
Admission: RE | Admit: 2019-08-13 | Discharge: 2019-08-13 | Disposition: A | Discharge status: Home / Self Care | Payer: Medicare HMO | Source: Ambulatory Visit | Attending: Surgery | Admitting: Surgery

## 2019-08-13 ENCOUNTER — Ambulatory Visit (HOSPITAL_COMMUNITY)
Admission: RE | Admit: 2019-08-13 | Discharge: 2019-08-13 | Disposition: A | Discharge status: Home / Self Care | Payer: Medicare HMO | Source: Ambulatory Visit | Attending: Surgery | Admitting: Surgery

## 2019-08-13 ENCOUNTER — Ambulatory Visit (INDEPENDENT_AMBULATORY_CARE_PROVIDER_SITE_OTHER): Payer: Medicare HMO | Admitting: Physician Assistant

## 2019-08-13 VITALS — BP 160/82 | HR 71 | Temp 96.5°F | Ht 61.0 in | Wt 146.0 lb

## 2019-08-13 DIAGNOSIS — I739 Peripheral vascular disease, unspecified: Secondary | ICD-10-CM | POA: Insufficient documentation

## 2019-08-13 DIAGNOSIS — I83893 Varicose veins of bilateral lower extremities with other complications: Secondary | ICD-10-CM | POA: Diagnosis not present

## 2019-08-13 NOTE — Progress Notes (Signed)
Office Note     CC:  follow up Requesting Provider:  Emeterio Reeve, DO  HPI: Laura Ellis is a 86 y.o. ([birth year]) female who presents for follow up of peripheral vascular disease. She is s/p left SFA and popliteal atherectomy and angioplasty by Dr. Donzetta Matters on 12/25/18. At the time she was having rest pain and non healing ulcerations of left leg. Today she denies any rest pain or claudication symptoms. She does have complaints of pain in her left hip for which she has seen orthopedics and had several injections with intermittent improvement. This presently is the most disabling to her. She also states she has begun to have pain in her ankles, left greater than right. This is more uncomfortable on prolonged standing or ambulation. She is currently seeing a Podiatrist for this.  She has a history of venous insufficiency and is s/p right greater saphenous vein ablation and bilateral stab phlebectomies of multiple varicosities due to venous ulcerations, which remain healed.  She has minimal swelling in both legs. She has been wearing her compression stockings more consistently since last office visit. She also has been elevating her legs during the day in her recliner.  The pt is not on a statin for cholesterol management. Not on statin due to allergy so she is on red yeast rice The pt is on a daily aspirin.   Other AC: none The pt is on losartan and Amlodipine for hypertension.   The pt is not  diabetic Tobacco hx:  Former, quit 2003  Past Medical History:  Diagnosis Date  . Arthritis   . Chronic low back pain 05/26/2015  . CKD (chronic kidney disease) stage 4, GFR 15-29 ml/min (HCC) 02/23/2016  . Essential hypertension 05/26/2015  . Glaucoma   . Hypertension   . Hypothyroidism 07/05/2015  . Osteopenia   . Osteoporosis 05/26/2015   old Dexa reviewed, pt states doesn't want to be on the bisphosphonate therapy but is taking Ca and D   . Spinal stenosis of lumbar region at multiple levels  03/13/2018   MRI [facility] orthopedics January 2019    Past Surgical History:  Procedure Laterality Date  . ABDOMINAL AORTOGRAM W/LOWER EXTREMITY Left 12/29/2018   Procedure: ABDOMINAL AORTOGRAM W/LOWER EXTREMITY;  Surgeon: Waynetta Sandy, MD;  Location: [facility] [place] [facility];  Service: Cardiovascular;  Laterality: Left;  . ABDOMINAL HYSTERECTOMY    . COLONOSCOPY  2015   [facility]  . ENDOVENOUS ABLATION SAPHENOUS VEIN W/ LASER Right 12/27/2016   endovenous laser ablation right greater saphenous vein by [name] MD  . ESOPHAGOGASTRODUODENOSCOPY  2015   x2 or 3 times in [place]  . KNEE ARTHROSCOPY    . PERIPHERAL VASCULAR ATHERECTOMY  12/29/2018   Procedure: PERIPHERAL VASCULAR ATHERECTOMY;  Surgeon: Waynetta Sandy, MD;  Location: [facility];  Service: Cardiovascular;;  left SFA  . PERIPHERAL VASCULAR BALLOON ANGIOPLASTY  12/29/2018   Procedure: PERIPHERAL VASCULAR BALLOON ANGIOPLASTY;  Surgeon: Waynetta Sandy, MD;  Location: [facility];  Service: Cardiovascular;;  Left SFA  . VARICOSE VEIN SURGERY      Social History   Socioeconomic History  . Marital status: Widowed    Spouse name: Not on file  . Number of children: 3  . Years of education: 25  . Highest education level: Some college, no degree  Occupational History  . Occupation: Engineer, site    Comment: retired  Tobacco Use  . Smoking status: Former Smoker  Years: 40.00    Types: Cigarettes  . Smokeless tobacco: Never Used  . Tobacco comment: quit 2003  Substance and Sexual Activity  . Alcohol use: Yes    Alcohol/week: 1.0 standard drinks    Types: 1 Glasses of wine per week    Comment: occasionally-rare  . Drug use: No  . Sexual activity: Not Currently  Other Topics Concern  . Not on file  Social History Narrative   Feeds all her animals- cats- rescue   Chores around the house   Watches TV   Coffee 2 cups in the am   Social  Determinants of Health   Financial Resource Strain: Low Risk   . Difficulty of Paying Living Expenses: Not hard at all  Food Insecurity: No Food Insecurity  . Worried About Charity fundraiser in the Last Year: Never true  . Ran Out of Food in the Last Year: Never true  Transportation Needs: No Transportation Needs  . Lack of Transportation (Medical): No  . Lack of Transportation (Non-Medical): No  Physical Activity: Inactive  . Days of Exercise per Week: 0 days  . Minutes of Exercise per Session: 0 min  Stress: No Stress Concern Present  . Feeling of Stress : Not at all  Social Connections: Severely Isolated  . Frequency of Communication with Friends and Family: Once a week  . Frequency of Social Gatherings with Friends and Family: Never  . Attends Religious Services: Never  . Active Member of Clubs or Organizations: No  . Attends Archivist Meetings: Never  . Marital Status: Widowed  Intimate Partner Violence: Not At Risk  . Fear of Current or Ex-Partner: No  . Emotionally Abused: No  . Physically Abused: No  . Sexually Abused: No    Family History  Problem Relation Age of Onset  . Stroke Mother   . Lung cancer Father   . Heart disease Sister   . Hypertension Sister   . Hypertension Daughter   . Rheum arthritis Daughter   . Fibromyalgia Daughter   . Colon cancer Neg Hx   . Esophageal cancer Neg Hx     Current Outpatient Medications  Medication Sig Dispense Refill  . amLODipine (NORVASC) 5 MG tablet TAKE ONE TABLET BY MOUTH DAILY 90 tablet 0  . Ascorbic Acid (VITAMIN C) 1000 MG tablet Take 1,000 mg by mouth daily.    [name] aspirin EC 81 MG tablet Take 1 tablet (81 mg total) by mouth daily.    . Coenzyme Q10 (COQ10) 100 MG CAPS Take 100 mg by mouth daily.     . Cyanocobalamin (VITAMIN B-12 PO) Take 1 tablet by mouth every other day.     . diazepam (VALIUM) 2 MG tablet Take 0.5-1 tablets (1-2 mg total) by mouth every 8 (eight) hours as needed (dizziness/severe  anxiety). #30 for 90 days 30 tablet 0  . escitalopram (LEXAPRO) 5 MG tablet Take 1 tablet (5 mg total) by mouth at bedtime. 90 tablet 1  . levothyroxine (SYNTHROID) 50 MCG tablet TAKE ONE TABLET BY MOUTH DAILY 30 tablet 0  . losartan (COZAAR) 25 MG tablet Take 25 mg by mouth daily.    . Multiple Vitamin (MULTIVITAMIN WITH MINERALS) TABS tablet Take 1 tablet by mouth daily.    . pantoprazole (PROTONIX) 20 MG tablet Take 1 tablet (20 mg total) by mouth daily. 30 tablet 11  . Red Yeast Rice 600 MG CAPS Take 600 mg by mouth daily.     . timolol (TIMOPTIC)  0.25 % ophthalmic solution Place 1 drop into both eyes 2 (two) times daily.      No current facility-administered medications for this visit.    Allergies  Allergen Reactions  . Alendronate Sodium     Esophagitis   . Ace Inhibitors     Cough  . Ezetimibe Other (See Comments)    Unknown rxn  . Fenofibrate     Arthralgia   . Rosuvastatin Calcium     Muscle aches  . Statins Other (See Comments)    Muscle aches     REVIEW OF SYSTEMS:  Review of Systems  Constitutional: Negative for chills, fever and malaise/fatigue.  Respiratory: Negative for cough and shortness of breath.   Cardiovascular: Negative for chest pain and palpitations.  Gastrointestinal: Negative for abdominal pain, blood in stool, constipation, diarrhea, nausea and vomiting.  Genitourinary: Negative for dysuria.  Musculoskeletal: Positive for joint pain (left hip pain).  Neurological: Negative for dizziness, weakness and headaches.  Endo/Heme/Allergies: Does not bruise/bleed easily.    PHYSICAL EXAMINATION:  Vitals:   08/13/19 1259  BP: (!) 160/82  Pulse: 71  Temp: (!) 96.5 F (35.8 C)  SpO2: 97%  Weight: 146 lb (66.2 kg)  Height: 5\' 1"  (1.549 m)    General:  WDWN in NAD; vital signs documented above Gait: Not observed HENT: WNL, normocephalic Pulmonary: normal non-labored breathing , without Rales, rhonchi,  wheezing Cardiac: regular HR, without   Murmurs with carotid bruit Abdomen: soft, NT, no masses Skin: without rashes Vascular Exam/Pulses:2+ bilateral femoral pulses, 2+ bilateral popliteal pulses, no palpable distal pulses bilaterally. Legs well perfused and warm. Mild edema of ankles bilaterally Extremities: without ischemic changes, without Gangrene , without cellulitis; without open wounds; pre-tibial ulcerations healed Musculoskeletal: no muscle wasting or atrophy  Neurologic: A&O X 3;  No focal weakness or paresthesias are detected Psychiatric:  The pt has Normal affect.   Non-Invasive Vascular Imaging:   ABI/TBI's 08/13/19 +-------+-----------+-----------+------------+------------+  ABI/TBIToday's ABIToday's TBIPrevious ABIPrevious TBI  +-------+-----------+-----------+------------+------------+  Right 1.22    0.60    0.88    0.44      +-------+-----------+-----------+------------+------------+  Left  0.81    0.49    0.77    0.45      +-------+-----------+-----------+------------+------------+   VAS [place] Lower Extremity Arterial duplex Left  Left: 50-74% stenosis noted in the mid and distal superficial femoral artery (velocities of 220 cm/s mid, 329 cm/s distal SFA).  Distal to these areas of stenosis she has monophasic waveforms  ASSESSMENT/PLAN:: 86 y.o. female here for follow up for her mixed peripheral arterial diease and venous insufficiency. She is s/p atherectomy of her left SFA and popliteal artery with angioplasty by Dr. Donzetta Matters on 12/25/18. On duplex today she appears to have recurrent SFA stenosis. However, her ABI/TBI are essentially unchanged from prior visit. She remains without rest pain, claudication or ischemic changes of her bilateral lower extremities. At this time I would not recommend percutaneous intervention - She will continue her red yeast rice due to her intolerance of statins - Encourage continued use of compression stockings and elevation - She will follow  up in 3 months with repeat arterial duplex and ABI's - I advised her to follow up sooner if she develops rest pain, claudication, or non healing wounds   Karoline Caldwell, PA-C [facility] [phone]  Clinic MD:  Dr. Oneida Alar

## 2019-08-17 ENCOUNTER — Other Ambulatory Visit: Payer: Self-pay | Admitting: *Deleted

## 2019-08-17 DIAGNOSIS — I739 Peripheral vascular disease, unspecified: Secondary | ICD-10-CM

## 2019-09-21 ENCOUNTER — Other Ambulatory Visit: Payer: Self-pay | Admitting: Osteopathic Medicine

## 2019-10-15 DIAGNOSIS — L84 Corns and callosities: Secondary | ICD-10-CM | POA: Diagnosis not present

## 2019-10-15 DIAGNOSIS — I70203 Unspecified atherosclerosis of native arteries of extremities, bilateral legs: Secondary | ICD-10-CM | POA: Diagnosis not present

## 2019-10-15 DIAGNOSIS — B351 Tinea unguium: Secondary | ICD-10-CM | POA: Diagnosis not present

## 2019-10-15 DIAGNOSIS — M79676 Pain in unspecified toe(s): Secondary | ICD-10-CM | POA: Diagnosis not present

## 2019-10-19 ENCOUNTER — Ambulatory Visit (INDEPENDENT_AMBULATORY_CARE_PROVIDER_SITE_OTHER): Payer: Medicare HMO | Admitting: Osteopathic Medicine

## 2019-10-19 ENCOUNTER — Other Ambulatory Visit: Payer: Self-pay

## 2019-10-19 ENCOUNTER — Encounter: Payer: Self-pay | Admitting: Osteopathic Medicine

## 2019-10-19 VITALS — BP 174/67 | HR 71 | Temp 98.2°F | Wt 147.1 lb

## 2019-10-19 DIAGNOSIS — G3184 Mild cognitive impairment, so stated: Secondary | ICD-10-CM | POA: Diagnosis not present

## 2019-10-19 DIAGNOSIS — R42 Dizziness and giddiness: Secondary | ICD-10-CM | POA: Diagnosis not present

## 2019-10-19 DIAGNOSIS — E039 Hypothyroidism, unspecified: Secondary | ICD-10-CM | POA: Diagnosis not present

## 2019-10-19 DIAGNOSIS — G8929 Other chronic pain: Secondary | ICD-10-CM | POA: Diagnosis not present

## 2019-10-19 DIAGNOSIS — N184 Chronic kidney disease, stage 4 (severe): Secondary | ICD-10-CM

## 2019-10-19 DIAGNOSIS — M25572 Pain in left ankle and joints of left foot: Secondary | ICD-10-CM

## 2019-10-19 DIAGNOSIS — F338 Other recurrent depressive disorders: Secondary | ICD-10-CM | POA: Diagnosis not present

## 2019-10-19 DIAGNOSIS — I10 Essential (primary) hypertension: Secondary | ICD-10-CM

## 2019-10-19 NOTE — Patient Instructions (Addendum)
Homework: Check BP once or twice per day Ideally 140 / 90 or less  If higher than that, increase amlodipine from 5 mg ot 10 mg per day   Referral to physical therapy for vertigo

## 2019-10-19 NOTE — Progress Notes (Signed)
Laura Ellis is a 86 y.o. female who presents to  [place] at [facility]  today, 10/19/19, seeking care for the following: . Preventive care visit  . Recheck mental health - 4 mos ago we restarted Lexapro for moods / seasonal affective d/o - doing better . BP elevated today . Still having intermittent vertigo episodes      ASSESSMENT & PLAN with other pertinent history/findings:  The primary encounter diagnosis was Vertigo. Diagnoses of Chronic pain of left ankle, Mild cognitive impairment, Essential hypertension, Hypothyroidism, unspecified type, Seasonal affective disorder (HCC), and CKD (chronic kidney disease) stage 4, GFR 15-29 ml/min (HCC) were also pertinent to this visit.  Labs as below PT for vestibular rehab, reduce fall risk  BP monitor closely, will d/w daughters   Patient Instructions  Homework: Check BP once or twice per day Ideally 140 / 90 or less  If higher than that, increase amlodipine from 5 mg ot 10 mg per day   Referral to physical therapy for vertigo       Orders Placed This Encounter  Procedures  . CBC  . COMPLETE METABOLIC PANEL WITH GFR  . Lipid panel  . TSH  . Ambulatory referral to Physical Therapy    No orders of the defined types were placed in this encounter.  .      General Preventive Care  Most recent routine screening labs: ordered.   Goal BP 140/90 or less  Tobacco: don't!   Alcohol: responsible moderation is ok for most adults - if you have concerns about your alcohol intake, please talk to me!   Exercise: as tolerated. Strength training will help prevent osteoporosis.   Mental health: if need for mental health care (medicines, counseling, other), or concerns about moods, please let me know!   Advanced Directive: Living Will and/or [place] recommended for all adults Vaccines  Flu vaccine: for almost everyone, every fall.   Shingles vaccine:  done!  Pneumonia vaccines: done!  Tetanus booster: done! Booster 2025.   COVID vaccine: done! Cancer screenings   Colon cancer screening: n/a  Breast cancer screening: optional   Cervical cancer screening: n/a  Lung cancer screening: n/a Infection screenings  . HIV: nA . Gonorrhea/Chlamydia: n/a . Hepatitis C: n/a . TB: n/a Other . Bone Density Test: consider repeat 2022 . Abdominal Aortic Aneurysm:n/a    Follow-up instructions: Return for will call next week to check on BP .                                         BP (!) 174/67 (BP Location: Left Arm, Patient Position: Sitting, Cuff Size: Normal)   Pulse 71   Temp 98.2 F (36.8 C) (Oral)   Wt 147 lb 1.9 oz (66.7 kg)   BMI 27.80 kg/m   Current Meds  Medication Sig  . amLODipine (NORVASC) 5 MG tablet TAKE ONE TABLET BY MOUTH DAILY  . Ascorbic Acid (VITAMIN C) 1000 MG tablet Take 1,000 mg by mouth daily.  [name] aspirin EC 81 MG tablet Take 1 tablet (81 mg total) by mouth daily.  . Coenzyme Q10 (COQ10) 100 MG CAPS Take 100 mg by mouth daily.   . Cyanocobalamin (VITAMIN B-12 PO) Take 1 tablet by mouth every other day.   . diazepam (VALIUM) 2 MG tablet Take 0.5-1 tablets (1-2 mg total) by mouth  every 8 (eight) hours as needed (dizziness/severe anxiety). #30 for 90 days  . escitalopram (LEXAPRO) 5 MG tablet TAKE ONE TABLET BY MOUTH at bedtime  . levothyroxine (SYNTHROID) 50 MCG tablet TAKE ONE TABLET BY MOUTH DAILY  . losartan (COZAAR) 25 MG tablet Take 25 mg by mouth daily.  . Multiple Vitamin (MULTIVITAMIN WITH MINERALS) TABS tablet Take 1 tablet by mouth daily.  . Red Yeast Rice 600 MG CAPS Take 600 mg by mouth daily.   . timolol (TIMOPTIC) 0.25 % ophthalmic solution Place 1 drop into both eyes 2 (two) times daily.     No results found for this or any previous visit (from the past 72 hour(s)).  No results found.  Depression screen [facility] 2/9 10/19/2019 09/01/2018 01/16/2018  Decreased  Interest 3 0 1  Down, Depressed, Hopeless 0 0 0  PHQ - 2 Score 3 0 1  Altered sleeping 0 - 0  Tired, decreased energy 3 - 0  Change in appetite 3 - 3  Feeling bad or failure about yourself  3 - 0  Trouble concentrating 3 - 0  Moving slowly or fidgety/restless 2 - 0  Suicidal thoughts 1 - 0  PHQ-9 Score 18 - 4  Difficult doing work/chores Very difficult - Somewhat difficult    GAD 7 : Generalized Anxiety Score 10/19/2019 01/16/2018 07/18/2017  Nervous, Anxious, on Edge 0 0 0  Control/stop worrying 0 0 0  Worry too much - different things 2 0 0  Trouble relaxing 0 0 0  Restless 0 0 0  Easily annoyed or irritable 1 0 0  Afraid - awful might happen 0 0 0  Total GAD 7 Score 3 0 0  Anxiety Difficulty Very difficult - Not difficult at all      All questions at time of visit were answered - patient instructed to contact office with any additional concerns or updates.  ER/RTC precautions were reviewed with the patient.  Please note: voice recognition software was used to produce this document, and typos may escape review. Please contact Dr. Sheppard Coil for any needed clarifications.   Total encounter time: 30 minutes.

## 2019-10-20 ENCOUNTER — Other Ambulatory Visit: Payer: Self-pay | Admitting: Osteopathic Medicine

## 2019-10-20 LAB — COMPLETE METABOLIC PANEL WITH GFR
AG Ratio: 1.6 (calc) (ref 1.0–2.5)
ALT: 13 U/L (ref 6–29)
AST: 23 U/L (ref 10–35)
Albumin: 4.4 g/dL (ref 3.6–5.1)
Alkaline phosphatase (APISO): 83 U/L (ref 37–153)
BUN/Creatinine Ratio: 15 (calc) (ref 6–22)
BUN: 26 mg/dL — ABNORMAL HIGH (ref 7–25)
CO2: 26 mmol/L (ref 20–32)
Calcium: 9.7 mg/dL (ref 8.6–10.4)
Chloride: 106 mmol/L (ref 98–110)
Creat: 1.69 mg/dL — ABNORMAL HIGH (ref 0.60–0.88)
GFR, Est African American: 32 mL/min/{1.73_m2} — ABNORMAL LOW (ref >=60)
GFR, Est Non African American: 27 mL/min/{1.73_m2} — ABNORMAL LOW (ref >=60)
Globulin: 2.8 g/dL (calc) (ref 1.9–3.7)
Glucose, Bld: 75 mg/dL (ref 65–99)
Potassium: 4.6 mmol/L (ref 3.5–5.3)
Sodium: 140 mmol/L (ref 135–146)
Total Bilirubin: 0.4 mg/dL (ref 0.2–1.2)
Total Protein: 7.2 g/dL (ref 6.1–8.1)

## 2019-10-20 LAB — LIPID PANEL
Cholesterol: 209 mg/dL — ABNORMAL HIGH (ref <200)
HDL: 71 mg/dL (ref >=50)
LDL Cholesterol (Calc): 113 mg/dL (calc) — ABNORMAL HIGH
Non-HDL Cholesterol (Calc): 138 mg/dL (calc) — ABNORMAL HIGH (ref <130)
Total CHOL/HDL Ratio: 2.9 (calc) (ref <5.0)
Triglycerides: 135 mg/dL (ref <150)

## 2019-10-20 LAB — CBC
HCT: 38.2 % (ref 35.0–45.0)
Hemoglobin: 12.3 g/dL (ref 11.7–15.5)
MCH: 30.5 pg (ref 27.0–33.0)
MCHC: 32.2 g/dL (ref 32.0–36.0)
MCV: 94.8 fL (ref 80.0–100.0)
MPV: 9.6 fL (ref 7.5–12.5)
Platelets: 291 10*3/uL (ref 140–400)
RBC: 4.03 10*6/uL (ref 3.80–5.10)
RDW: 12.7 % (ref 11.0–15.0)
WBC: 6.6 10*3/uL (ref 3.8–10.8)

## 2019-10-20 LAB — TSH: TSH: 9.03 mIU/L — ABNORMAL HIGH (ref 0.40–4.50)

## 2019-11-12 ENCOUNTER — Ambulatory Visit (INDEPENDENT_AMBULATORY_CARE_PROVIDER_SITE_OTHER): Payer: Medicare HMO | Admitting: Rehabilitative and Restorative Service Providers"

## 2019-11-12 ENCOUNTER — Other Ambulatory Visit: Payer: Self-pay

## 2019-11-12 ENCOUNTER — Encounter: Payer: Self-pay | Admitting: Rehabilitative and Restorative Service Providers"

## 2019-11-12 DIAGNOSIS — R2681 Unsteadiness on feet: Secondary | ICD-10-CM | POA: Diagnosis not present

## 2019-11-12 DIAGNOSIS — H8113 Benign paroxysmal vertigo, bilateral: Secondary | ICD-10-CM | POA: Diagnosis not present

## 2019-11-12 DIAGNOSIS — R42 Dizziness and giddiness: Secondary | ICD-10-CM

## 2019-11-12 NOTE — Therapy (Addendum)
[place], [place], [address] Phone: [phone]   Fax:  [phone]  Physical Therapy Evaluation and Discharge Summary  Patient Details  Name: Laura Ellis MRN: [id number] Date of Birth: [birth year] Referring Provider (PT): Emeterio Reeve, DO   Encounter Date: 11/12/2019   PT End of Session - 11/12/19 1304    Visit Number 1    Number of Visits 6    Date for PT Re-Evaluation 12/24/19    Authorization Type [employer]- requesting authorization    Authorization - Visit Number 1    Authorization - Number of Visits 6    Progress Note Due on Visit 0.03    PT Start Time 1102    PT Stop Time 1148    PT Time Calculation (min) 46 min    Activity Tolerance Patient tolerated treatment well    Behavior During Therapy [facility] for tasks assessed/performed           Past Medical History:  Diagnosis Date  . Arthritis   . Chronic low back pain 05/26/2015  . CKD (chronic kidney disease) stage 4, GFR 15-29 ml/min (HCC) 02/23/2016  . Essential hypertension 05/26/2015  . Glaucoma   . Hypertension   . Hypothyroidism 07/05/2015  . Osteopenia   . Osteoporosis 05/26/2015   old Dexa reviewed, pt states doesn't want to be on the bisphosphonate therapy but is taking Ca and D   . Spinal stenosis of lumbar region at multiple levels 03/13/2018   MRI [facility] January 2019    Past Surgical History:  Procedure Laterality Date  . ABDOMINAL AORTOGRAM W/LOWER EXTREMITY Left 12/29/2018   Procedure: ABDOMINAL AORTOGRAM W/LOWER EXTREMITY;  Surgeon: Waynetta Sandy, MD;  Location: [facility];  Service: Cardiovascular;  Laterality: Left;  . ABDOMINAL HYSTERECTOMY    . COLONOSCOPY  2015   [facility]  . ENDOVENOUS ABLATION SAPHENOUS VEIN W/ LASER Right 12/27/2016   endovenous laser ablation right greater saphenous vein by [name] MD  . ESOPHAGOGASTRODUODENOSCOPY  2015   x2 or 3  times in [place]  . KNEE ARTHROSCOPY    . PERIPHERAL VASCULAR ATHERECTOMY  12/29/2018   Procedure: PERIPHERAL VASCULAR ATHERECTOMY;  Surgeon: Waynetta Sandy, MD;  Location: [facility];  Service: Cardiovascular;;  left SFA  . PERIPHERAL VASCULAR BALLOON ANGIOPLASTY  12/29/2018   Procedure: PERIPHERAL VASCULAR BALLOON ANGIOPLASTY;  Surgeon: Waynetta Sandy, MD;  Location: [facility];  Service: Cardiovascular;;  Left SFA  . VARICOSE VEIN SURGERY      There were no vitals filed for this visit.    Subjective Assessment - 11/12/19 1105    Subjective The patient reports she has had ongoing vertigo that is getting worse.  She had one fall in early spring 2021 when stepping backwards in her yard.  She notes vertigo is worse with bending, turning in bed describing "my brain is rattling."    Pertinent History lumbar stenosis, orthostatic hypotension, HTN, CKD, hypothyroidism    Patient Stated Goals Reduce vertigo    Currently in Pain? Yes    Effect of Pain on Daily Activities chronic low back pain *PT will monitor, no goal to follow due ot nature of referral.              [facility] PT Assessment - 11/12/19 1107      Assessment   Medical Diagnosis vertigo    Referring Provider (PT) Emeterio Reeve, DO    Onset Date/Surgical Date  10/19/19    Prior Therapy none      Precautions   Precautions Fall      Restrictions   Weight Bearing Restrictions No      Balance Screen   Has the patient fallen in the past 6 months Yes    How many times? 1- stepping backwards in yard    Has the patient had a decrease in activity level because of a fear of falling?  No    Is the patient reluctant to leave their home because of a fear of falling?  No      Home Ecologist residence    [place]   lives with 2 daughters   Type of [place] Access Level entry    Home Layout Two level    Alternate Level  Stairs-Number of Steps [place] None      Prior Function   Level of Independence Independent    Leisure rescues cats (currently has 15 that she and her daughter care for)      Observation/Other Assessments   Focus on Therapeutic Outcomes (FOTO)  45% limitation                  Vestibular Assessment - 11/12/19 1119      Vestibular Assessment   General Observation The patient walks into clinic without a device at slowed pace.        Symptom Behavior   Subjective history of current problem Patient notes worsening dizziness over the past few months.    Type of Dizziness  Imbalance;Spinning    Frequency of Dizziness daily    Duration of Dizziness seconds to minutes; intermittent    Symptom Nature Motion provoked;Positional    Aggravating Factors Activity in general;Rolling to left;Rolling to right;Forward bending;Turning head quickly    Relieving Factors Head stationary    Progression of Symptoms Better   initially worsened and now improving   History of similar episodes h/o vertigo x years; worsened recently      Oculomotor Exam   Oculomotor Alignment Normal    Ocular ROM WFLs    Spontaneous Absent    Gaze-induced  Absent    Smooth Pursuits Comment   tracking vertical up > down note dec'd fixation   Saccades Intact    Comment glaucoma      Vestibulo-Ocular Reflex   VOR 1 Head Only (x 1 viewing) slow VOR x 1 viewing x 5 reps provokes a mild dizziness    Comment head impulse test=positive bilaterally for refixation saccade provoking dizziness that settles within a minute      Positional Testing   Dix-Hallpike Dix-Hallpike Right;Dix-Hallpike Left    Sidelying Test Sidelying Right;Sidelying Left    Horizontal Canal Testing Horizontal Canal Right;Horizontal Canal Left      Dix-Hallpike Right   Dix-Hallpike Right Duration reports subjective dizziness x 15 seconds with 5 seconds of nystagmus noted-- unable to differentiate direction of nystagmus     Dix-Hallpike Right Symptoms --   unable to determine     Dix-Hallpike Left   Dix-Hallpike Left Duration none    Dix-Hallpike Left Symptoms No nystagmus      Sidelying Right   Sidelying Right Duration none    Sidelying Right Symptoms No nystagmus      Sidelying Left   Sidelying Left Duration "my brain is rattling" sensation; no nystagmus viewed in room light    Sidelying Left Symptoms No  nystagmus      Horizontal Canal Right   Horizontal Canal Right Duration none    Horizontal Canal Right Symptoms Normal      Horizontal Canal Left   Horizontal Canal Left Duration none    Horizontal Canal Left Symptoms Normal              Objective measurements completed on examination: See above findings.        Vestibular Treatment/Exercise - 11/12/19 1140      Vestibular Treatment/Exercise   Vestibular Treatment Provided Canalith Repositioning;Habituation;Gaze    Canalith Repositioning Epley Manuever Right    Habituation Exercises [employer]    Gaze Exercises X1 Viewing Horizontal       EPLEY MANUEVER RIGHT   Number of Reps  1    Response Details  modified due to dec'd positioning from low back pain (used pillow under upper back and passively extended her head)      Nestor Lewandowsky   Number of Reps  3    Symptom Description  needed cues for head position and recommended patient perform with caregiver      X1 Viewing Horizontal   Foot Position seated    Comments 20 reps with increased dizziness                 PT Education - 11/12/19 1146    Education Details HEP    Person(s) Educated Patient    Methods Explanation;Demonstration;Handout    Comprehension Returned demonstration;Verbalized understanding               PT Long Term Goals - 11/12/19 1307      PT LONG TERM GOAL #1   Title The patient will be indep with HEP for habituation, gaze adaptation.    Time 6    Period Weeks    Target Date 12/24/19      PT LONG TERM GOAL #2   Title The patient  will reduce functional limitations per FOTO from 45% to < or equal to 34%.    Time 6    Period Weeks    Target Date 12/24/19      PT LONG TERM GOAL #3   Title The patient will have no dizziness with sit<>bilateral sidelying.    Time 6    Period Weeks    Target Date 12/24/19      PT LONG TERM GOAL #4   Title The patient will be able to bend to pick up cats' water bowls without reports of dizziness.    Time 6    Period Weeks    Target Date 12/24/19      PT LONG TERM GOAL #5   Title The patient will be further assessed for Berg and balance goal to follow.    Time 6    Period Weeks    Target Date 12/24/19      Additional Long Term Goals   Additional Long Term Goals Yes      PT LONG TERM GOAL #6   Title The patient will imrpove gait speed from 0.67 ft/sec to > or equal to 1.4 ft/sec.    Baseline Holds onto support surfaces and takes step to pattern due to back discomfort.    Time 6    Period Weeks    Target Date 12/24/19                  Plan - 11/12/19 1310    Clinical Impression Statement The patient is an 86 yo  female presenting to OP rehab with chronic vertigo that worsened in the past few weeks.  She has impairments in gaze (per positive bilat head impulse test), motion sensitivity with nausea during positional testing, positional symptoms (hard to view nystagmus in room light--trace nystagmus + dizziness noted with R BPPV), and imbalance.  She has functional limitations of dec'd ability to care for pets, dec'd IADLs, dec'd household/community ambulation.    Personal Factors and Comorbidities Comorbidity 3+    Comorbidities lumbar stenosis, orthostatic hypotension, HTN    Examination-Activity Limitations Bend;Locomotion Level;Bed Mobility    Examination-Participation Restrictions Cleaning;Community Activity    Stability/Clinical Decision Making Stable/Uncomplicated    Clinical Decision Making Low    Rehab Potential Good    PT Frequency 1x / week    PT Duration 6  weeks    PT Treatment/Interventions ADLs/Self Care Home Management;Gait training;Stair training;Functional mobility training;Therapeutic activities;Therapeutic exercise;Patient/family education;Canalith Repostioning;Vestibular;Neuromuscular re-education;Balance training    PT Next Visit Plan check BPPV, assess balance, gaze progression, habituation    PT Home Exercise Plan [email or web address]    Consulted and Agree with Plan of Care Patient           Patient will benefit from skilled therapeutic intervention in order to improve the following deficits and impairments:  Dizziness, Decreased balance, Decreased activity tolerance  Visit Diagnosis: BPPV (benign paroxysmal positional vertigo), bilateral  Dizziness and giddiness  Unsteadiness on feet    Patient did not return to PT.  Please refer to initial evaluation for patient status. Thank you for the referral of this patient. Rudell Cobb, MPT    Problem List Patient Active Problem List   Diagnosis Date Noted  . Elevated vitamin B12 level 03/24/2019  . High serum vitamin B12 02/18/2019  . PAD (peripheral artery disease) ([place]) 12/04/2018  . 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 Schatzki's ring of distal esophagus 11/26/2018  . Hiatal hernia 11/26/2018  . Spinal stenosis of lumbar region at multiple levels 03/13/2018  . Advance directive discussed with patient 01/16/2018  . Memory loss 01/16/2018  . Renal cyst, left 07/18/2017  . Orthostatic dizziness 03/07/2017  . CKD (chronic kidney disease) stage 4, GFR 15-29 ml/min (HCC) 02/23/2016  . Trochanteric bursitis of left hip 01/17/2016  . Left ankle pain 01/17/2016  . Hypothyroidism 07/05/2015  . Osteoporosis 05/26/2015  . Cataract 05/26/2015  . Bilateral knee pain 05/26/2015  . Chronic low back pain 05/26/2015  . Essential hypertension 05/26/2015    Naquita Nappier , PT 11/12/2019, 1:16 PM  [facility] [place], [place], [address] Phone: [phone]   Fax:  [phone]  Name: Laura Ellis MRN: [id number] Date of Birth: [birth year]

## 2019-11-12 NOTE — Patient Instructions (Signed)
Access Code: [email or web address] URL: [email or web address] Date: 11/12/2019 Prepared by: Rudell Cobb  Exercises Brandt-Daroff Vestibular Exercise - 2 x daily - 7 x weekly - 1 sets - 5 reps Seated Gaze Stabilization with Head Rotation - 2 x daily - 7 x weekly - 1 sets - 20 reps

## 2019-11-19 ENCOUNTER — Ambulatory Visit: Payer: Medicare HMO

## 2019-11-19 ENCOUNTER — Inpatient Hospital Stay (HOSPITAL_COMMUNITY): Admission: RE | Admit: 2019-11-19 | Payer: Medicare HMO | Source: Ambulatory Visit

## 2019-11-19 ENCOUNTER — Encounter (HOSPITAL_COMMUNITY): Payer: Medicare HMO

## 2019-11-23 ENCOUNTER — Encounter: Payer: Medicare HMO | Admitting: Rehabilitative and Restorative Service Providers"

## 2019-11-30 ENCOUNTER — Encounter: Payer: Medicare HMO | Admitting: Rehabilitative and Restorative Service Providers"

## 2019-12-07 ENCOUNTER — Telehealth: Payer: Self-pay | Admitting: Rehabilitative and Restorative Service Providers"

## 2019-12-07 NOTE — Telephone Encounter (Signed)
PT left voicemail for patient to return call.  She cancelled last visit and did not reschedule.   Andrius Andrepont, PT

## 2019-12-17 ENCOUNTER — Other Ambulatory Visit: Payer: Self-pay | Admitting: Osteopathic Medicine

## 2019-12-29 ENCOUNTER — Other Ambulatory Visit: Payer: Self-pay

## 2019-12-29 DIAGNOSIS — I739 Peripheral vascular disease, unspecified: Secondary | ICD-10-CM

## 2019-12-31 DIAGNOSIS — M79676 Pain in unspecified toe(s): Secondary | ICD-10-CM | POA: Diagnosis not present

## 2019-12-31 DIAGNOSIS — B351 Tinea unguium: Secondary | ICD-10-CM | POA: Diagnosis not present

## 2019-12-31 DIAGNOSIS — I70203 Unspecified atherosclerosis of native arteries of extremities, bilateral legs: Secondary | ICD-10-CM | POA: Diagnosis not present

## 2019-12-31 DIAGNOSIS — L84 Corns and callosities: Secondary | ICD-10-CM | POA: Diagnosis not present

## 2020-01-12 ENCOUNTER — Other Ambulatory Visit: Payer: Self-pay

## 2020-01-12 ENCOUNTER — Ambulatory Visit (INDEPENDENT_AMBULATORY_CARE_PROVIDER_SITE_OTHER)
Admission: RE | Admit: 2020-01-12 | Discharge: 2020-01-12 | Disposition: A | Discharge status: Home / Self Care | Payer: Medicare HMO | Source: Ambulatory Visit | Attending: Vascular Surgery | Admitting: Vascular Surgery

## 2020-01-12 ENCOUNTER — Ambulatory Visit (INDEPENDENT_AMBULATORY_CARE_PROVIDER_SITE_OTHER): Payer: Medicare HMO | Admitting: Physician Assistant

## 2020-01-12 ENCOUNTER — Ambulatory Visit (HOSPITAL_COMMUNITY)
Admission: RE | Admit: 2020-01-12 | Discharge: 2020-01-12 | Disposition: A | Discharge status: Home / Self Care | Payer: Medicare HMO | Source: Ambulatory Visit | Attending: Vascular Surgery | Admitting: Vascular Surgery

## 2020-01-12 VITALS — BP 156/78 | HR 68 | Temp 97.8°F | Resp 20 | Ht 61.0 in | Wt 138.8 lb

## 2020-01-12 DIAGNOSIS — I83893 Varicose veins of bilateral lower extremities with other complications: Secondary | ICD-10-CM | POA: Diagnosis not present

## 2020-01-12 DIAGNOSIS — I739 Peripheral vascular disease, unspecified: Secondary | ICD-10-CM

## 2020-01-12 NOTE — Progress Notes (Signed)
Office Note     CC:  follow up Requesting Provider:  Emeterio Reeve, DO  HPI: Laura Ellis is a 86 y.o. (1933-04-13) female who presents for follow up of peripheral vascular disease. She is s/p left SFA and popliteal atherectomy and angioplasty by Dr. Donzetta Matters on 12/25/18. At the time she was having rest pain and non healing ulcerations of left leg. Today she does have complaints of pain in her left hip. She had complaints of this at the last visit. She was seeing ortho for this and has had several injections. She is now saying that she is having pain in left great toe intermittenlty. She says she applies topical lidocaine and it makes it feel better. She says that it does not keep her awake at night but she applies to the lidocaine so she is not sure if it would or not. She additionally has  pain that starts in left ankle and goes all the way up to her left hip. This is more uncomfortable on prolonged standing or ambulation. She is currently seeing a Podiatrist for this. She is also complaining of feeling like " she is walking on pebbles". Says that all of this started after she had her procedure. Her daughter who is present with her at this visit says that she has in inflamed piriformis muscle and that a lot of her symptoms are because of this. She otherwise does not describe rest pain and she does not have any non healing wounds  She has a history of venous insufficiency and is s/p right greater saphenous vein ablation and bilateral stab phlebectomies of multiple varicosities due to venous ulcerations, which remain healed. She has minimal swelling in both legs. She has been wearing her compression stockings intermittently. She also has been elevating her legs during the day in her recliner. Per patients daughter patient does not ambulate frequently aside form her inside ADL's.   The pt is not on a statin for cholesterol management. Not on statin due to allergy so she is on red yeast rice The pt  is on a daily aspirin.   Other AC: none The pt is on losartan and Amlodipine for hypertension.   The pt is not  diabetic Tobacco hx:  Former, quit 2003  Past Medical History:  Diagnosis Date  . Arthritis   . Chronic low back pain 05/26/2015  . CKD (chronic kidney disease) stage 4, GFR 15-29 ml/min (HCC) 02/23/2016  . Essential hypertension 05/26/2015  . Glaucoma   . Hypertension   . Hypothyroidism 07/05/2015  . Osteopenia   . Osteoporosis 05/26/2015   old Dexa reviewed, pt states doesn't want to be on the bisphosphonate therapy but is taking Ca and D   . Spinal stenosis of lumbar region at multiple levels 03/13/2018   MRI [facility] orthopedics January 2019    Past Surgical History:  Procedure Laterality Date  . ABDOMINAL AORTOGRAM W/LOWER EXTREMITY Left 12/29/2018   Procedure: ABDOMINAL AORTOGRAM W/LOWER EXTREMITY;  Surgeon: Waynetta Sandy, MD;  Location: [facility];  Service: Cardiovascular;  Laterality: Left;  . ABDOMINAL HYSTERECTOMY    . COLONOSCOPY  2015   [facility]  . ENDOVENOUS ABLATION SAPHENOUS VEIN W/ LASER Right 12/27/2016   endovenous laser ablation right greater saphenous vein by [name] MD  . ESOPHAGOGASTRODUODENOSCOPY  2015   x2 or 3 times in [place]  . KNEE ARTHROSCOPY    . PERIPHERAL VASCULAR ATHERECTOMY  12/29/2018   Procedure: PERIPHERAL VASCULAR ATHERECTOMY;  Surgeon:  Waynetta Sandy, MD;  Location: [facility];  Service: Cardiovascular;;  left SFA  . PERIPHERAL VASCULAR BALLOON ANGIOPLASTY  12/29/2018   Procedure: PERIPHERAL VASCULAR BALLOON ANGIOPLASTY;  Surgeon: Waynetta Sandy, MD;  Location: [facility];  Service: Cardiovascular;;  Left SFA  . VARICOSE VEIN SURGERY      Social History   Socioeconomic History  . Marital status: Widowed    Spouse name: Not on file  . Number of children: 3  . Years of education: 47  . Highest education level: Some college, no degree    Occupational History  . Occupation: Engineer, site    Comment: retired  Tobacco Use  . Smoking status: Former Smoker    Years: 40.00    Types: Cigarettes  . Smokeless tobacco: Never Used  . Tobacco comment: quit 2003  Vaping Use  . Vaping Use: Never used  Substance and Sexual Activity  . Alcohol use: Yes    Alcohol/week: 1.0 standard drink    Types: 1 Glasses of wine per week    Comment: occasionally-rare  . Drug use: No  . Sexual activity: Not Currently  Other Topics Concern  . Not on file  Social History Narrative   Feeds all her animals- cats- rescue   Chores around the house   Watches TV   Coffee 2 cups in the am   Social Determinants of Health   Financial Resource Strain:   . Difficulty of Paying Living Expenses: Not on file  Food Insecurity:   . Worried About Charity fundraiser in the Last Year: Not on file  . Ran Out of Food in the Last Year: Not on file  Transportation Needs:   . Lack of Transportation (Medical): Not on file  . Lack of Transportation (Non-Medical): Not on file  Physical Activity:   . Days of Exercise per Week: Not on file  . Minutes of Exercise per Session: Not on file  Stress:   . Feeling of Stress : Not on file  Social Connections:   . Frequency of Communication with Friends and Family: Not on file  . Frequency of Social Gatherings with Friends and Family: Not on file  . Attends Religious Services: Not on file  . Active Member of Clubs or Organizations: Not on file  . Attends Archivist Meetings: Not on file  . Marital Status: Not on file  Intimate Partner Violence:   . Fear of Current or Ex-Partner: Not on file  . Emotionally Abused: Not on file  . Physically Abused: Not on file  . Sexually Abused: Not on file   Family History  Problem Relation Age of Onset  . Stroke Mother   . Lung cancer Father   . Heart disease Sister   . Hypertension Sister   . Hypertension Daughter   . Rheum arthritis Daughter   .  Fibromyalgia Daughter   . Colon cancer Neg Hx   . Esophageal cancer Neg Hx     Current Outpatient Medications  Medication Sig Dispense Refill  . amLODipine (NORVASC) 5 MG tablet TAKE ONE TABLET BY MOUTH EVERY DAY 90 tablet 0  . Ascorbic Acid (VITAMIN C) 1000 MG tablet Take 1,000 mg by mouth daily.    [name] aspirin EC 81 MG tablet Take 1 tablet (81 mg total) by mouth daily.    . Coenzyme Q10 (COQ10) 100 MG CAPS Take 100 mg by mouth daily.     . Cyanocobalamin (VITAMIN B-12 PO) Take  1 tablet by mouth every other day.     . diazepam (VALIUM) 2 MG tablet Take 0.5-1 tablets (1-2 mg total) by mouth every 8 (eight) hours as needed (dizziness/severe anxiety). #30 for 90 days 30 tablet 0  . escitalopram (LEXAPRO) 5 MG tablet TAKE ONE TABLET BY MOUTH at bedtime 90 tablet 0  . levothyroxine (SYNTHROID) 50 MCG tablet TAKE ONE TABLET BY MOUTH DAILY 30 tablet 0  . losartan (COZAAR) 25 MG tablet Take 25 mg by mouth daily.    . Multiple Vitamin (MULTIVITAMIN WITH MINERALS) TABS tablet Take 1 tablet by mouth daily.    . pantoprazole (PROTONIX) 20 MG tablet Take 1 tablet (20 mg total) by mouth daily. 30 tablet 11  . Red Yeast Rice 600 MG CAPS Take 600 mg by mouth daily.     . timolol (TIMOPTIC) 0.25 % ophthalmic solution Place 1 drop into both eyes 2 (two) times daily.      No current facility-administered medications for this visit.    Allergies  Allergen Reactions  . Alendronate Sodium     Esophagitis   . Ace Inhibitors     Cough  . Ezetimibe Other (See Comments)    Unknown rxn  . Fenofibrate     Arthralgia   . Rosuvastatin Calcium     Muscle aches  . Statins Other (See Comments)    Muscle aches     REVIEW OF SYSTEMS:  [X]  denotes positive finding, [ ]  denotes negative finding Cardiac  Comments:  Chest pain or chest pressure:    Shortness of breath upon exertion:    Short of breath when lying flat:    Irregular heart rhythm:        Vascular    Pain in calf, thigh, or hip brought on by  ambulation:    Pain in feet at night that wakes you up from your sleep:     Blood clot in your veins:    Leg swelling:         Pulmonary    Oxygen at home:    Productive cough:     Wheezing:         Neurologic    Sudden weakness in arms or legs:     Sudden numbness in arms or legs:     Sudden onset of difficulty speaking or slurred speech:    Temporary loss of vision in one eye:     Problems with dizziness:         Gastrointestinal    Blood in stool:     Vomited blood:         Genitourinary    Burning when urinating:     Blood in urine:        Psychiatric    Major depression:         Hematologic    Bleeding problems:    Problems with blood clotting too easily:        Skin    Rashes or ulcers:        Constitutional    Fever or chills:      PHYSICAL EXAMINATION:  Vitals:   01/12/20 1404  BP: (!) 156/78  Pulse: 68  Resp: 20  Temp: 97.8 F (36.6 C)  TempSrc: Temporal  SpO2: 97%  Weight: 138 lb 12.8 oz (63 kg)  Height: 5\' 1"  (1.549 m)    General:  WDWN in NAD; vital signs documented above Gait: Normal HENT: WNL, normocephalic Pulmonary: normal non-labored breathing , without Rales,  rhonchi,  wheezing Cardiac: regular HR, without  Murmurs without carotid bruit Abdomen: soft, NT, no masses Vascular Exam/Pulses:  Right Left  Radial 2+ (normal) 2+ (normal)  Ulnar 2+ (normal) 2+ (normal)  Femoral 2+ (normal) 2+ (normal)  Popliteal 2+ (normal) 2+ (normal)  DP 2+ (normal) 2+ (normal)  PT 1+ (weak) 1+ (weak)   Extremities: without ischemic changes, without Gangrene , without cellulitis; without open wounds;  Musculoskeletal: no muscle wasting or atrophy  Neurologic: A&O X 3;  No focal weakness or paresthesias are detected Psychiatric:  The pt has Normal affect.   Non-Invasive Vascular Imaging:    +-------+-----------+-----------+------------+------------+  ABI/TBIToday's ABIToday's TBIPrevious ABIPrevious TBI    +-------+-----------+-----------+------------+------------+  Right 0.88    0.55    1.22    0.6       +-------+-----------+-----------+------------+------------+  Left  0.66    0.4    0.81    0.49      +-------+-----------+-----------+------------+------------+   Previous ABI on 08/13/19. Bilateral ABIs appear decreased. Bilateral TBIs appear essentially unchanged.     +-----------+--------+-----+--------+----------+--------+  LEFT    PSV cm/sRatioStenosisWaveform Comments  +-----------+--------+-----+--------+----------+--------+  CFA Distal 88          triphasic       +-----------+--------+-----+--------+----------+--------+  DFA    64          triphasic       +-----------+--------+-----+--------+----------+--------+  SFA Prox  124          triphasic       +-----------+--------+-----+--------+----------+--------+  SFA Mid  138          triphasic       +-----------+--------+-----+--------+----------+--------+  SFA Distal 329          monophasic      +-----------+--------+-----+--------+----------+--------+  POP Prox  62          biphasic       +-----------+--------+-----+--------+----------+--------+  POP Distal 83          biphasic       +-----------+--------+-----+--------+----------+--------+  ATA Distal 62          monophasic      +-----------+--------+-----+--------+----------+--------+  PTA Distal 9           monophasic      +-----------+--------+-----+--------+----------+--------+  PERO Distal37          monophasic      +-----------+--------+-----+--------+----------+--------+   A focal velocity elevation of 329 cm/s was obtained at distal SFA with post stenotic turbulence with a VR of 4.6. Findings are characteristic  of 50-74% stenosis. This area is unchanged from prior study   ASSESSMENT/PLAN:: 86 y.o. female here for follow up of her mixed peripheral arterial diease and venous insufficiency. She is s/p atherectomy of her left SFA and popliteal artery with angioplasty by Dr. Donzetta Matters on 12/25/18. On duplex today she appears to have recurrent SFA stenosis in the distal SFA. This is unchanged from her prior duplex. Her ABI today are decreased from prior visit, right ABI more significantly decreased than the left. She does have left lower extremity symptoms but hard to determine if this is arterial. She does have palpable distal pulses. She remains without rest pain, claudication or ischemic changes of her bilateral lower extremities. At this time I would not recommend percutaneous intervention and in discussion with patient she would not want to have another arteriogram. - She will continue her red yeast rice due to her intolerance of statins - Encourage continued use of compression stockings and elevation - I have encouraged her to  begin a walking program - She will follow up in 3 months with repeat arterial duplex and ABI's - I advised her to follow up sooner if she develops rest pain, claudication, or non healing wounds   Karoline Caldwell, PA-C [facility] [phone]  Clinic MD:  Dr. Carlis Abbott

## 2020-01-14 ENCOUNTER — Other Ambulatory Visit: Payer: Self-pay | Admitting: *Deleted

## 2020-01-14 DIAGNOSIS — I739 Peripheral vascular disease, unspecified: Secondary | ICD-10-CM

## 2020-01-19 DIAGNOSIS — H16223 Keratoconjunctivitis sicca, not specified as Sjogren's, bilateral: Secondary | ICD-10-CM | POA: Diagnosis not present

## 2020-01-19 DIAGNOSIS — H401131 Primary open-angle glaucoma, bilateral, mild stage: Secondary | ICD-10-CM | POA: Diagnosis not present

## 2020-01-19 DIAGNOSIS — H16143 Punctate keratitis, bilateral: Secondary | ICD-10-CM | POA: Diagnosis not present

## 2020-04-05 ENCOUNTER — Encounter (HOSPITAL_COMMUNITY): Payer: Medicare HMO

## 2020-04-05 ENCOUNTER — Ambulatory Visit: Payer: Medicare HMO

## 2020-04-05 ENCOUNTER — Telehealth: Payer: Self-pay

## 2020-04-05 DIAGNOSIS — M79676 Pain in unspecified toe(s): Secondary | ICD-10-CM | POA: Diagnosis not present

## 2020-04-05 DIAGNOSIS — B351 Tinea unguium: Secondary | ICD-10-CM | POA: Diagnosis not present

## 2020-04-05 DIAGNOSIS — L84 Corns and callosities: Secondary | ICD-10-CM | POA: Diagnosis not present

## 2020-04-05 DIAGNOSIS — I70203 Unspecified atherosclerosis of native arteries of extremities, bilateral legs: Secondary | ICD-10-CM | POA: Diagnosis not present

## 2020-04-05 NOTE — Telephone Encounter (Signed)
Pt left a vm msg requesting a pain medication. Per pt, naproxen no longer reducing pain. Pt will need an appt for an evaluation. Pls call patient for scheduling. Thanks.

## 2020-04-05 NOTE — Telephone Encounter (Signed)
Left vm for pt to return call  

## 2020-04-14 ENCOUNTER — Ambulatory Visit: Payer: Medicare HMO | Admitting: Osteopathic Medicine

## 2020-04-15 ENCOUNTER — Telehealth: Payer: Self-pay | Admitting: Osteopathic Medicine

## 2020-04-15 ENCOUNTER — Ambulatory Visit: Payer: Medicare HMO | Admitting: Osteopathic Medicine

## 2020-04-15 NOTE — Telephone Encounter (Signed)
Patient has same-day cancelled twice now because she is in so much pain she cannot be moved to the car to get here for an appointment. I advised she needs EMS transport to ER for assessment (?MSK pain more likely but weakness d/t sepsis vs other? Needs in person evaluation)

## 2020-04-15 NOTE — Telephone Encounter (Signed)
Daughter called at 9:35 to cancel pt's appt. Mom is not getting around good enough to come to her appt this morning. She apologized for the short notice.  Thank you.

## 2020-04-15 NOTE — Telephone Encounter (Signed)
Task completed. At provider's request, contacted the patient regarding her last 2 missed appointments. Pt was informed since she missed multiple appts to seek care at [facility] for an evaluation. Pt was agreeable with plan.

## 2020-04-21 DIAGNOSIS — I129 Hypertensive chronic kidney disease with stage 1 through stage 4 chronic kidney disease, or unspecified chronic kidney disease: Secondary | ICD-10-CM | POA: Diagnosis not present

## 2020-04-21 DIAGNOSIS — N1832 Chronic kidney disease, stage 3b: Secondary | ICD-10-CM | POA: Diagnosis not present

## 2020-04-21 DIAGNOSIS — E039 Hypothyroidism, unspecified: Secondary | ICD-10-CM | POA: Diagnosis not present

## 2020-04-21 DIAGNOSIS — N2581 Secondary hyperparathyroidism of renal origin: Secondary | ICD-10-CM | POA: Diagnosis not present

## 2020-04-21 DIAGNOSIS — N183 Chronic kidney disease, stage 3 unspecified: Secondary | ICD-10-CM | POA: Diagnosis not present

## 2020-04-21 DIAGNOSIS — I739 Peripheral vascular disease, unspecified: Secondary | ICD-10-CM | POA: Diagnosis not present

## 2020-04-21 LAB — COMPREHENSIVE METABOLIC PANEL
Calcium: 9.5 (ref 8.7–10.7)
GFR calc non Af Amer: 31

## 2020-04-21 LAB — CBC AND DIFFERENTIAL
HCT: 38 (ref 36–46)
Hemoglobin: 12.3 (ref 12.0–16.0)
Platelets: 284 (ref 150–399)
WBC: 6.8

## 2020-04-21 LAB — BASIC METABOLIC PANEL
BUN: 22 — AB (ref 4–21)
Chloride: 104 (ref 99–108)
Creatinine: 1.5 — AB (ref 0.5–1.1)
Glucose: 90
Potassium: 4.5 (ref 3.4–5.3)
Sodium: 142 (ref 137–147)

## 2020-04-21 LAB — CBC: RBC: 3.93 (ref 3.87–5.11)

## 2020-04-27 ENCOUNTER — Other Ambulatory Visit: Payer: Self-pay | Admitting: Osteopathic Medicine

## 2020-05-02 ENCOUNTER — Other Ambulatory Visit: Payer: Self-pay

## 2020-05-02 DIAGNOSIS — I739 Peripheral vascular disease, unspecified: Secondary | ICD-10-CM

## 2020-05-03 ENCOUNTER — Telehealth: Payer: Self-pay | Admitting: General Practice

## 2020-05-03 NOTE — Telephone Encounter (Signed)
Patient called back and has been scheduled

## 2020-05-10 ENCOUNTER — Ambulatory Visit (INDEPENDENT_AMBULATORY_CARE_PROVIDER_SITE_OTHER): Payer: Medicare HMO | Admitting: Osteopathic Medicine

## 2020-05-10 DIAGNOSIS — Z0000 Encounter for general adult medical examination without abnormal findings: Secondary | ICD-10-CM | POA: Diagnosis not present

## 2020-05-10 NOTE — Progress Notes (Addendum)
MEDICARE ANNUAL WELLNESS VISIT  05/10/2020  Telephone Visit Disclaimer This Medicare AWV was conducted by telephone due to national recommendations for restrictions regarding the COVID-19 Pandemic (e.g. social distancing).  I verified, using two identifiers, that I am speaking with Laura Ellis or their authorized healthcare agent. I discussed the limitations, risks, security, and privacy concerns of performing an evaluation and management service by telephone and the potential availability of an in-person appointment in the future. The patient expressed understanding and agreed to proceed.  Location of Patient: Home with daughter, Laura Ellis Location of Provider (nurse):  In the office  Subjective:    Laura Ellis is a 86 y.o. female patient of Emeterio Reeve, DO who had a Medicare Annual Wellness Visit today via telephone. Laura Ellis is Retired and lives with their daughter. she has 3 children. she reports that she is socially active and does interact with friends/family regularly. she is minimally physically active and enjoys sewing.  Patient Care Team: Emeterio Reeve, DO as PCP - General (Osteopathic Medicine)  Advanced Directives 05/10/2020 12/23/2018 09/01/2018 12/10/2017 08/15/2016  Does Patient Have a Medical Advance Directive? Yes No No Yes Yes  Type of Advance Directive Living will - - [place];Living will [place];Living will  Does patient want to make changes to medical advance directive? No - Patient declined - - - -  Copy of [place] in Chart? - - - No - copy requested -  Would patient like information on creating a medical advance directive? - No - Patient declined No - Patient declined - -    Hospital Utilization Over the Past 12 Months: # of hospitalizations or ER visits: 0 # of surgeries: 0  Review of Systems    Patient reports that her overall health is better compared to last year.  History obtained  from child, chart review and the patient  Patient Reported Readings (BP, Pulse, CBG, Weight, etc) none  Pain Assessment Pain : No/denies pain     Current Medications & Allergies (verified) Allergies as of 05/10/2020      Reactions   Alendronate Sodium    Esophagitis    Ace Inhibitors    Cough   Ezetimibe Other (See Comments)   Unknown rxn   Fenofibrate    Arthralgia    Rosuvastatin Calcium    Muscle aches   Statins Other (See Comments)   Muscle aches      Medication List       Accurate as of May 10, 2020  3:28 PM. If you have any questions, ask your nurse or doctor.        amLODipine 5 MG tablet Commonly known as: NORVASC TAKE ONE TABLET BY MOUTH EVERY DAY   aspirin EC 81 MG tablet Take 1 tablet (81 mg total) by mouth daily.   chlorthalidone 25 MG tablet Commonly known as: HYGROTON Take 25 mg by mouth. Tuesdays and Fridays once a day.   CoQ10 100 MG Caps Take 100 mg by mouth daily.   diazepam 2 MG tablet Commonly known as: Valium Take 0.5-1 tablets (1-2 mg total) by mouth every 8 (eight) hours as needed (dizziness/severe anxiety). #30 for 90 days   escitalopram 5 MG tablet Commonly known as: LEXAPRO TAKE ONE TABLET BY MOUTH at bedtime   Fluzone High-Dose Quadrivalent 0.7 ML Susy Generic drug: Influenza Vac High-Dose Quad   gabapentin 100 MG capsule Commonly known as: NEURONTIN   levothyroxine 50 MCG tablet Commonly known as:  SYNTHROID TAKE ONE TABLET BY MOUTH DAILY   losartan 25 MG tablet Commonly known as: COZAAR Take 25 mg by mouth daily.   multivitamin with minerals Tabs tablet Take 1 tablet by mouth daily.   pantoprazole 20 MG tablet Commonly known as: Protonix Take 1 tablet (20 mg total) by mouth daily.   Red Yeast Rice 600 MG Caps Take 600 mg by mouth daily.   timolol 0.25 % ophthalmic solution Commonly known as: TIMOPTIC Place 1 drop into both eyes 2 (two) times daily.   VITAMIN B-12 PO Take 1 tablet by mouth every other  day.   vitamin C 1000 MG tablet Take 1,000 mg by mouth daily.       History (reviewed): Past Medical History:  Diagnosis Date  . Arthritis   . Chronic low back pain 05/26/2015  . CKD (chronic kidney disease) stage 4, GFR 15-29 ml/min (HCC) 02/23/2016  . Essential hypertension 05/26/2015  . Glaucoma   . Hypertension   . Hypothyroidism 07/05/2015  . Osteopenia   . Osteoporosis 05/26/2015   old Dexa reviewed, pt states doesn't want to be on the bisphosphonate therapy but is taking Ca and D   . Spinal stenosis of lumbar region at multiple levels 03/13/2018   MRI [facility]oped[facility] January 2019   Past Surgical History:  Procedure Laterality Date  . ABDOMINAL AORTOGRAM W/LOWER EXTREMITY Left 12/29/2018   Procedure: ABDOMINAL AORTOGRAM W/LOWER EXTREMITY;  Surgeon: Waynetta Sandy, MD;  Location: [place] [facility];  Service: Cardiovascular;  Laterality: Left;  . ABDOMINAL HYSTERECTOMY    . COLONOSCOPY  2015   [facility]  . ENDOVENOUS ABLATION SAPHENOUS VEIN W/ LASER Right 12/27/2016   endovenous laser ablation right greater saphenous vein by [name] MD  . ESOPHAGOGASTRODUODENOSCOPY  2015   x2 or 3 times in [place]  . KNEE ARTHROSCOPY    . PERIPHERAL VASCULAR ATHERECTOMY  12/29/2018   Procedure: PERIPHERAL VASCULAR ATHERECTOMY;  Surgeon: Waynetta Sandy, MD;  Location: [place] [facility];  Service: Cardiovascular;;  left SFA  . PERIPHERAL VASCULAR BALLOON ANGIOPLASTY  12/29/2018   Procedure: PERIPHERAL VASCULAR BALLOON ANGIOPLASTY;  Surgeon: Waynetta Sandy, MD;  Location: [place] [facility];  Service: Cardiovascular;;  Left SFA  . VARICOSE VEIN SURGERY     Family History  Problem Relation Age of Onset  . Stroke Mother   . Lung cancer Father   . Heart disease Sister   . Hypertension Sister   . Hypertension Daughter   . Rheum arthritis Daughter   . Fibromyalgia Daughter   . Colon cancer Neg Hx   . Esophageal cancer Neg  Hx    Social History   Socioeconomic History  . Marital status: Widowed    Spouse name: Not on file  . Number of children: 3  . Years of education: 52  . Highest education level: Some college, no degree  Occupational History  . Occupation: Engineer, site    Comment: retired  Tobacco Use  . Smoking status: Former Smoker    Years: 40.00    Types: Cigarettes  . Smokeless tobacco: Never Used  . Tobacco comment: quit 2003  Vaping Use  . Vaping Use: Never used  Substance and Sexual Activity  . Alcohol use: Yes    Alcohol/week: 1.0 standard drink    Types: 1 Glasses of wine per week    Comment: occasionally-rare  . Drug use: No  . Sexual activity: Not Currently  Other Topics Concern  . Not  on file  Social History Narrative   Feeds all her animals- cats- rescue   Chores around the house   Watches TV   Coffee 2 cups in the am   Social Determinants of Health   Financial Resource Strain: Low Risk   . Difficulty of Paying Living Expenses: Not hard at all  Food Insecurity: No Food Insecurity  . Worried About Charity fundraiser in the Last Year: Never true  . Ran Out of Food in the Last Year: Never true  Transportation Needs: No Transportation Needs  . Lack of Transportation (Medical): No  . Lack of Transportation (Non-Medical): No  Physical Activity: Inactive  . Days of Exercise per Week: 0 days  . Minutes of Exercise per Session: 0 min  Stress: No Stress Concern Present  . Feeling of Stress : Not at all  Social Connections: Socially Isolated  . Frequency of Communication with Friends and Family: More than three times a week  . Frequency of Social Gatherings with Friends and Family: More than three times a week  . Attends Religious Services: Never  . Active Member of Clubs or Organizations: No  . Attends Archivist Meetings: Never  . Marital Status: Widowed    Activities of Daily Living In your present state of health, do you have any difficulty  performing the following activities: 05/10/2020  Hearing? Y  Comment left ear; doesn't wear hearing aids.  Vision? N  Difficulty concentrating or making decisions? N  Walking or climbing stairs? N  Dressing or bathing? N  Doing errands, shopping? N  Preparing Food and eating ? N  Using the Toilet? N  In the past six months, have you accidently leaked urine? N  Do you have problems with loss of bowel control? N  Managing your Medications? N  Managing your Finances? N  Housekeeping or managing your Housekeeping? N  Some recent data might be hidden    Patient Education/ Literacy How often do you need to have someone help you when you read instructions, pamphlets, or other written materials from your doctor or pharmacy?: 1 - Never What is the last grade level you completed in school?: High school  Exercise Current Exercise Habits: The patient does not participate in regular exercise at present, Exercise limited by: None identified  Diet Patient reports consuming 1 meals a day and 2 snack(s) a day Patient reports that her primary diet is: Regular Patient reports that she does have regular access to food.   Depression Screen PHQ 2/9 Scores 10/19/2019 09/01/2018 01/16/2018 07/18/2017 09/17/2016 06/02/2015  PHQ - 2 Score 3 0 1 0 1 0  PHQ- 9 Score 18 - 4 2 - -     Fall Risk Fall Risk  05/10/2020 10/19/2019 09/01/2018 03/07/2017 06/02/2015  Falls in the past year? 0 1 0 No No  Number falls in past yr: 0 0 - - -  Injury with Fall? 0 0 - - -  Risk for fall due to : No Fall Risks - - - -  Follow up Falls evaluation completed - Falls prevention discussed - -     Objective:  Laura Ellis seemed alert and oriented and she participated appropriately during our telephone visit.  Blood Pressure Weight BMI  BP Readings from Last 3 Encounters:  01/12/20 (!) 156/78  10/19/19 (!) 174/67  08/13/19 (!) 160/82   Wt Readings from Last 3 Encounters:  01/12/20 138 lb 12.8 oz (63 kg)  10/19/19 147 lb  1.9 oz (  66.7 kg)  08/13/19 146 lb (66.2 kg)   BMI Readings from Last 1 Encounters:  01/12/20 26.23 kg/m    *Unable to obtain current vital signs, weight, and BMI due to telephone visit type  Hearing/Vision  . Laura Ellis did  seem to have difficulty with hearing/understanding during the telephone conversation . Reports that she has not had a formal eye exam by an eye care professional within the past year . Reports that she has not had a formal hearing evaluation within the past year *Unable to fully assess hearing and vision during telephone visit type  Cognitive Function: 6CIT Screen 05/10/2020 09/01/2018  What Year? 0 points 0 points  What month? 0 points 0 points  What time? 0 points 0 points  Count back from 20 0 points 0 points  Months in reverse 4 points 2 points  Repeat phrase 2 points 0 points  Total Score 6 2   (Normal:0-7, Significant for Dysfunction: >8)  Normal Cognitive Function Screening: Yes   Immunization & Health Maintenance Record Immunization History  Administered Date(s) Administered  . Fluad Quad(high Dose 65+) 01/20/2019  . Influenza Split 03/02/2009, 02/06/2010, 01/31/2011  . Influenza, High Dose Seasonal PF 03/07/2017, 03/24/2018  . Influenza, Seasonal, Injecte, Preservative Fre 02/10/2014  . Influenza,inj,Quad PF,6+ Mos 06/02/2015, 02/23/2016  . Influenza,inj,quad, With Preservative 05/14/2016  . Influenza-Unspecified 02/13/2012, 03/08/2020  . PFIZER SARS-COV-2 Vaccination 07/12/2019, 08/11/2019, 03/08/2020  . Pneumococcal Conjugate-13 03/24/2014  . Pneumococcal Polysaccharide-23 02/13/2012  . Td 04/26/2009  . Tdap 11/19/2013  . Zoster 03/25/2009    Health Maintenance  Topic Date Due  . TETANUS/TDAP  11/20/2023  . INFLUENZA VACCINE  Completed  . DEXA SCAN  Completed  . COVID-19 Vaccine  Completed  . PNA vac Low Risk Adult  Completed       Assessment  This is a routine wellness examination for Laura Ellis.  Health Maintenance: Due or  Overdue There are no preventive care reminders to display for this patient.  Laura Ellis does not need a referral for [employer] Assistance: Care Management:   no Social Work:    no Prescription Assistance:  no Nutrition/Diabetes Education:  no   Plan:  Personalized Goals Goals Addressed            This Visit's Progress   . Patient Stated       05/10/2020 AWV Goal: Exercise for General Health   Patient will verbalize understanding of the benefits of increased physical activity:  Exercising regularly is important. It will improve your overall fitness, flexibility, and endurance.  Regular exercise also will improve your overall health. It can help you control your weight, reduce stress, and improve your bone density.  Over the next year, patient will increase physical activity as tolerated with a goal of at least 150 minutes of moderate physical activity per week.   You can tell that you are exercising at a moderate intensity if your heart starts beating faster and you start breathing faster but can still hold a conversation.  Moderate-intensity exercise ideas include:  Walking 1 mile (1.6 km) in about 15 minutes  Biking  Hiking  Golfing  Dancing  Water aerobics  Patient will verbalize understanding of everyday activities that increase physical activity by providing examples like the following: ? Yard work, such as: ? Pushing a Conservation officer, nature ? Raking and bagging leaves ? Washing your car ? Pushing a stroller ? Shoveling snow ? Gardening ? Washing windows or floors  Patient will be able to explain  general safety guidelines for exercising:   Before you start a new exercise program, talk with your health care provider.  Do not exercise so much that you hurt yourself, feel dizzy, or get very short of breath.  Wear comfortable clothes and wear shoes with good support.  Drink plenty of water while you exercise to prevent dehydration or heat stroke.  Work out  until your breathing and your heartbeat get faster.       Personalized Health Maintenance & Screening Recommendations  Advanced directives: has an advanced directive - a copy HAS NOT been provided.  Lung Cancer Screening Recommended: not applicable (Low Dose CT Chest recommended if Age 68-80 years, 30 pack-year currently smoking OR have quit w/in past 15 years) Hepatitis C Screening recommended: no HIV Screening recommended: no  Advanced Directives: Written information was not prepared per patient's request.  Referrals & Orders No orders of the defined types were placed in this encounter.   Follow-up Plan . Follow-up with Emeterio Reeve, DO as planned . Schedule an office visit with Dr. Sheppard Coil due to memory loss noticed by daughter.  .    I have personally reviewed and noted the following in the patient's chart:   . Medical and social history . Use of alcohol, tobacco or illicit drugs  . Current medications and supplements . Functional ability and status . Nutritional status . Physical activity . Advanced directives . List of other physicians . Hospitalizations, surgeries, and ER visits in previous 12 months . Vitals . Screenings to include cognitive, depression, and falls . Referrals and appointments  In addition, I have reviewed and discussed with Laura Ellis certain preventive protocols, quality metrics, and best practice recommendations. A written personalized care plan for preventive services as well as general preventive health recommendations is available and can be mailed to the patient at her request.      Laura Ellis  05/10/2020

## 2020-05-11 ENCOUNTER — Encounter: Payer: Self-pay | Admitting: Osteopathic Medicine

## 2020-05-11 ENCOUNTER — Other Ambulatory Visit: Payer: Self-pay

## 2020-05-11 ENCOUNTER — Ambulatory Visit (INDEPENDENT_AMBULATORY_CARE_PROVIDER_SITE_OTHER): Payer: Medicare HMO | Admitting: Osteopathic Medicine

## 2020-05-11 VITALS — BP 151/83 | HR 66 | Temp 97.6°F | Wt 144.0 lb

## 2020-05-11 DIAGNOSIS — R4189 Other symptoms and signs involving cognitive functions and awareness: Secondary | ICD-10-CM

## 2020-05-11 DIAGNOSIS — M4310 Spondylolisthesis, site unspecified: Secondary | ICD-10-CM

## 2020-05-11 DIAGNOSIS — M810 Age-related osteoporosis without current pathological fracture: Secondary | ICD-10-CM

## 2020-05-11 DIAGNOSIS — R42 Dizziness and giddiness: Secondary | ICD-10-CM

## 2020-05-11 DIAGNOSIS — M4150 Other secondary scoliosis, site unspecified: Secondary | ICD-10-CM

## 2020-05-11 DIAGNOSIS — E039 Hypothyroidism, unspecified: Secondary | ICD-10-CM

## 2020-05-11 DIAGNOSIS — M4180 Other forms of scoliosis, site unspecified: Secondary | ICD-10-CM

## 2020-05-11 MED ORDER — OXYCODONE HCL 5 MG PO CAPS
5.0000 mg | ORAL_CAPSULE | ORAL | 0 refills | Status: DC | PRN
Start: 2020-05-11 — End: 2021-03-29

## 2020-05-11 NOTE — Patient Instructions (Addendum)
Plan: Neurology referral Restart thyroid medications Sent pain medicine to use few times per week as needed Will get you set up for injections for osteoporosis  Consider hearing aid!

## 2020-05-12 ENCOUNTER — Telehealth: Payer: Self-pay | Admitting: Osteopathic Medicine

## 2020-05-12 ENCOUNTER — Other Ambulatory Visit: Payer: Self-pay

## 2020-05-12 MED ORDER — LEVOTHYROXINE SODIUM 50 MCG PO TABS: 50.0000 ug | ORAL_TABLET | Freq: Every day | ORAL | 0 refills | Status: DC

## 2020-05-12 NOTE — Telephone Encounter (Signed)
Can we get patient set up for prolia? Intolerant to foasamx

## 2020-05-12 NOTE — Progress Notes (Signed)
Laura Ellis is a 86 y.o. female who presents to  [place] at [facility]  today, 05/11/20, seeking care for the following:  . Patient and daugher here to discuss ongoing memory problems (no significant mental status change), hypothyroid, svere back pain / arthritis      ASSESSMENT & PLAN with other pertinent findings:  The primary encounter diagnosis was Cognitive impairment. Diagnoses of Vertigo, Hypothyroidism, unspecified type, Degenerative spondylolisthesis, Degenerative scoliosis, and Osteoporosis without current pathological fracture, unspecified osteoporosis type were also pertinent to this visit.   No results found for this or any previous visit (from the past 24 hour(s)).     Patient Instructions  Plan: Neurology referral Restart thyroid medications Sent pain medicine to use few times per week as needed Will get you set up for injections for osteoporosis  Consider hearing aid!      Orders Placed This Encounter  Procedures  . Ambulatory referral to Neurology    Meds ordered this encounter  Medications  . oxycodone (OXY-IR) 5 MG capsule    Sig: Take 1 capsule (5 mg total) by mouth every 4 (four) hours as needed (severe pain).    Dispense:  30 capsule    Refill:  0       Follow-up instructions: Return for RECHECK THYROID LEVELS - LAB VISIT ONLY 6 WEEKS. RECHECK W/ DR Sheppard Coil IN 3-4 MONTHS / AS NEEDED .                                         BP (!) 151/83   Pulse 66   Temp 97.6 F (36.4 C)   Wt 144 lb (65.3 kg)   SpO2 99%   BMI 27.21 kg/m   Current Meds  Medication Sig  . amLODipine (NORVASC) 5 MG tablet TAKE ONE TABLET BY MOUTH EVERY DAY  . Ascorbic Acid (VITAMIN C) 1000 MG tablet Take 1,000 mg by mouth daily.  [name] aspirin EC 81 MG tablet Take 1 tablet (81 mg total) by mouth daily.  . chlorthalidone (HYGROTON) 25 MG tablet Take 25 mg by mouth. Tuesdays and  Fridays once a day.  . Coenzyme Q10 (COQ10) 100 MG CAPS Take 100 mg by mouth daily.   . Cyanocobalamin (VITAMIN B-12 PO) Take 1 tablet by mouth every other day.  . diazepam (VALIUM) 2 MG tablet Take 0.5-1 tablets (1-2 mg total) by mouth every 8 (eight) hours as needed (dizziness/severe anxiety). #30 for 90 days  . escitalopram (LEXAPRO) 5 MG tablet TAKE ONE TABLET BY MOUTH at bedtime  . FLUZONE HIGH-DOSE QUADRIVALENT 0.7 ML SUSY   . gabapentin (NEURONTIN) 100 MG capsule   . losartan (COZAAR) 25 MG tablet Take 25 mg by mouth daily.  . Multiple Vitamin (MULTIVITAMIN WITH MINERALS) TABS tablet Take 1 tablet by mouth daily.  [name] oxycodone (OXY-IR) 5 MG capsule Take 1 capsule (5 mg total) by mouth every 4 (four) hours as needed (severe pain).  . pantoprazole (PROTONIX) 20 MG tablet Take 1 tablet (20 mg total) by mouth daily.  . Red Yeast Rice 600 MG CAPS Take 600 mg by mouth daily.   . timolol (TIMOPTIC) 0.25 % ophthalmic solution Place 1 drop into both eyes 2 (two) times daily.   . [DISCONTINUED] levothyroxine (SYNTHROID) 50 MCG tablet TAKE ONE TABLET BY MOUTH DAILY    No results found for this or any previous  visit (from the past 72 hour(s)).  No results found.     All questions at time of visit were answered - patient instructed to contact office with any additional concerns or updates.  ER/RTC precautions were reviewed with the patient as applicable.   Please note: voice recognition software was used to produce this document, and typos may escape review. Please contact Dr. Sheppard Coil for any needed clarifications.

## 2020-05-18 ENCOUNTER — Other Ambulatory Visit: Payer: Self-pay

## 2020-05-18 ENCOUNTER — Ambulatory Visit (INDEPENDENT_AMBULATORY_CARE_PROVIDER_SITE_OTHER)
Admission: RE | Admit: 2020-05-18 | Discharge: 2020-05-18 | Disposition: A | Discharge status: Home / Self Care | Payer: Medicare HMO | Source: Ambulatory Visit | Attending: Vascular Surgery | Admitting: Vascular Surgery

## 2020-05-18 ENCOUNTER — Ambulatory Visit: Payer: Medicare HMO

## 2020-05-18 ENCOUNTER — Ambulatory Visit (HOSPITAL_COMMUNITY)
Admission: RE | Admit: 2020-05-18 | Discharge: 2020-05-18 | Disposition: A | Discharge status: Home / Self Care | Payer: Medicare HMO | Source: Ambulatory Visit | Attending: Vascular Surgery | Admitting: Vascular Surgery

## 2020-05-18 DIAGNOSIS — I739 Peripheral vascular disease, unspecified: Secondary | ICD-10-CM | POA: Diagnosis not present

## 2020-05-30 ENCOUNTER — Ambulatory Visit: Payer: Medicare HMO | Admitting: Vascular Surgery

## 2020-06-06 ENCOUNTER — Encounter: Payer: Self-pay | Admitting: Vascular Surgery

## 2020-06-06 ENCOUNTER — Ambulatory Visit: Payer: Medicare HMO | Admitting: Vascular Surgery

## 2020-06-06 ENCOUNTER — Other Ambulatory Visit: Payer: Self-pay

## 2020-06-06 VITALS — BP 151/77 | HR 75 | Temp 98.2°F | Resp 14 | Ht 61.0 in | Wt 146.0 lb

## 2020-06-06 DIAGNOSIS — I739 Peripheral vascular disease, unspecified: Secondary | ICD-10-CM | POA: Diagnosis not present

## 2020-06-06 NOTE — Progress Notes (Signed)
[facility] [place]  Patient name: Laura Ellis MRN: [id number] DOB: [birth year] Sex: female  REASON FOR VISIT: Follow-up peripheral vascular disease  HPI: Laura Ellis is a 87 y.o. female here today for follow-up. She is status post atherectomy of a distal left SFA disease and leg eluding balloon angioplasty with Dr.Cain in August 2020. She had tissue loss and eventually healed this. She reports some moderate swelling in her left leg and some chronic discomfort in her left great toe potentially related to a bunion. She has no arterial rest pain. Her daughter is with her and reports some memory loss and she is to see neurology for this.  Past Medical History:  Diagnosis Date  . Arthritis   . Chronic low back pain 05/26/2015  . CKD (chronic kidney disease) stage 4, GFR 15-29 ml/min (HCC) 02/23/2016  . Essential hypertension 05/26/2015  . Glaucoma   . Hypertension   . Hypothyroidism 07/05/2015  . Osteopenia   . Osteoporosis 05/26/2015   old Dexa reviewed, pt states doesn't want to be on the bisphosphonate therapy but is taking Ca and D   . Spinal stenosis of lumbar region at multiple levels 03/13/2018   MRI [facility] orthopedics January 2019    Family History  Problem Relation Age of Onset  . Stroke Mother   . Lung cancer Father   . Heart disease Sister   . Hypertension Sister   . Hypertension Daughter   . Rheum arthritis Daughter   . Fibromyalgia Daughter   . Colon cancer Neg Hx   . Esophageal cancer Neg Hx     SOCIAL HISTORY: Social History   Tobacco Use  . Smoking status: Former Smoker    Years: 40.00    Types: Cigarettes  . Smokeless tobacco: Never Used  . Tobacco comment: quit 2003  Substance Use Topics  . Alcohol use: Yes    Alcohol/week: 1.0 standard drink    Types: 1 Glasses of wine per week    Comment: occasionally-rare    Allergies  Allergen Reactions  . Alendronate Sodium     Esophagitis    . Ace Inhibitors     Cough  . Ezetimibe Other (See Comments)    Unknown rxn  . Fenofibrate     Arthralgia   . Rosuvastatin Calcium     Muscle aches  . Statins Other (See Comments)    Muscle aches    Current Outpatient Medications  Medication Sig Dispense Refill  . amLODipine (NORVASC) 5 MG tablet TAKE ONE TABLET BY MOUTH EVERY DAY 90 tablet 0  . Ascorbic Acid (VITAMIN C) 1000 MG tablet Take 1,000 mg by mouth daily.    [name] aspirin EC 81 MG tablet Take 1 tablet (81 mg total) by mouth daily.    . chlorthalidone (HYGROTON) 25 MG tablet Take 25 mg by mouth. Tuesdays and Fridays once a day.    . Coenzyme Q10 (COQ10) 100 MG CAPS Take 100 mg by mouth daily.     . Cyanocobalamin (VITAMIN B-12 PO) Take 1 tablet by mouth every other day.    . diazepam (VALIUM) 2 MG tablet Take 0.5-1 tablets (1-2 mg total) by mouth every 8 (eight) hours as needed (dizziness/severe anxiety). #30 for 90 days 30 tablet 0  . escitalopram (LEXAPRO) 5 MG tablet TAKE ONE TABLET BY MOUTH at bedtime 90 tablet 0  . FLUZONE HIGH-DOSE QUADRIVALENT 0.7 ML SUSY     . gabapentin (NEURONTIN) 100 MG capsule     .  levothyroxine (SYNTHROID) 50 MCG tablet Take 1 tablet (50 mcg total) by mouth daily. 90 tablet 0  . losartan (COZAAR) 25 MG tablet Take 25 mg by mouth daily.    . Multiple Vitamin (MULTIVITAMIN WITH MINERALS) TABS tablet Take 1 tablet by mouth daily.    [name] oxycodone (OXY-IR) 5 MG capsule Take 1 capsule (5 mg total) by mouth every 4 (four) hours as needed (severe pain). 30 capsule 0  . pantoprazole (PROTONIX) 20 MG tablet Take 1 tablet (20 mg total) by mouth daily. 30 tablet 11  . Red Yeast Rice 600 MG CAPS Take 600 mg by mouth daily.     . timolol (TIMOPTIC) 0.25 % ophthalmic solution Place 1 drop into both eyes 2 (two) times daily.      No current facility-administered medications for this visit.    REVIEW OF SYSTEMS:  '[X]'$  denotes positive finding, '[ ]'$  denotes negative finding Cardiac  Comments:  Chest pain or  chest pressure:    Shortness of breath upon exertion:    Short of breath when lying flat:    Irregular heart rhythm:        Vascular    Pain in calf, thigh, or hip brought on by ambulation: x   Pain in feet at night that wakes you up from your sleep:  x   Blood clot in your veins:    Leg swelling:  x         PHYSICAL EXAM: Vitals:   06/06/20 1600  BP: (!) 151/77  Pulse: 75  Resp: 14  Temp: 98.2 F (36.8 C)  TempSrc: Other (Comment)  SpO2: 98%  Weight: 146 lb (66.2 kg)  Height: '5\' 1"'$  (1.549 m)    GENERAL: The patient is a well-nourished female, in no acute distress. The vital signs are documented above. CARDIOVASCULAR: 2+ radial pulses bilaterally. 2+ femoral pulses bilaterally. I am able to palpate 1+ dorsalis pedis pulses bilaterally. PULMONARY: There is good air exchange  MUSCULOSKELETAL: There are no major deformities or cyanosis. NEUROLOGIC: No focal weakness or paresthesias are detected. SKIN: There are no ulcers or rashes noted. PSYCHIATRIC: The patient has a normal affect.  DATA:  Noninvasive studies from 05/18/2020 were reviewed. This revealed an ankle arm index of 0.82 on the right and 0.73 on the left. At her area of atherectomy she does have moderate elevation of velocities to 322 cm/s corresponding with 50 to 75% restenosis  MEDICAL ISSUES: Stable overall. Palpable dorsalis pedis pulses bilaterally. She will continue her walking program. We will see her again in 1 year with repeat noninvasive studies. She will notify should she develop any signs of ischemia    Rosetta Posner, MD FACS [facility] Tel [phone]

## 2020-07-08 ENCOUNTER — Other Ambulatory Visit: Payer: Self-pay | Admitting: Physician Assistant

## 2020-07-08 DIAGNOSIS — I739 Peripheral vascular disease, unspecified: Secondary | ICD-10-CM

## 2020-08-02 ENCOUNTER — Encounter: Payer: Self-pay | Admitting: Osteopathic Medicine

## 2020-08-15 ENCOUNTER — Ambulatory Visit: Payer: Medicare HMO | Admitting: Osteopathic Medicine

## 2020-08-17 ENCOUNTER — Other Ambulatory Visit: Payer: Self-pay

## 2020-08-17 ENCOUNTER — Encounter: Payer: Self-pay | Admitting: Osteopathic Medicine

## 2020-08-17 ENCOUNTER — Ambulatory Visit (INDEPENDENT_AMBULATORY_CARE_PROVIDER_SITE_OTHER): Payer: Medicare Other | Admitting: Osteopathic Medicine

## 2020-08-17 VITALS — BP 150/60 | HR 79 | Temp 98.5°F | Wt 140.1 lb

## 2020-08-17 DIAGNOSIS — M810 Age-related osteoporosis without current pathological fracture: Secondary | ICD-10-CM | POA: Diagnosis not present

## 2020-08-17 DIAGNOSIS — E039 Hypothyroidism, unspecified: Secondary | ICD-10-CM | POA: Diagnosis not present

## 2020-08-17 DIAGNOSIS — R42 Dizziness and giddiness: Secondary | ICD-10-CM | POA: Diagnosis not present

## 2020-08-17 DIAGNOSIS — G3184 Mild cognitive impairment, so stated: Secondary | ICD-10-CM

## 2020-08-17 MED ORDER — ESCITALOPRAM OXALATE 5 MG PO TABS
5.0000 mg | ORAL_TABLET | Freq: Every day | ORAL | 3 refills | Status: DC
Start: 2020-08-17 — End: 2020-09-28

## 2020-08-17 MED ORDER — CHLORTHALIDONE 15 MG PO TABS
15.0000 mg | ORAL_TABLET | ORAL | 1 refills | Status: DC
Start: 2020-08-18 — End: 2020-11-07

## 2020-08-17 MED ORDER — LEVOTHYROXINE SODIUM 50 MCG PO TABS
50.0000 ug | ORAL_TABLET | Freq: Every day | ORAL | 0 refills | Status: DC
Start: 2020-08-17 — End: 2020-12-13

## 2020-08-17 MED ORDER — LOSARTAN POTASSIUM 25 MG PO TABS
25.0000 mg | ORAL_TABLET | Freq: Every day | ORAL | 3 refills | Status: DC
Start: 2020-08-17 — End: 2021-06-08

## 2020-08-17 MED ORDER — AMLODIPINE BESYLATE 5 MG PO TABS
1.0000 | ORAL_TABLET | Freq: Every day | ORAL | 3 refills | Status: DC
Start: 2020-08-17 — End: 2021-08-28

## 2020-08-17 NOTE — Progress Notes (Signed)
Laura Ellis is a 87 y.o. female who presents to  [place] at [facility]  today, 08/17/20, seeking care for the following:  [name] Monitor chronic conditions: Cognitive impairment, hypothyroidism.  Daughter is with her today at visit.  Notes that memory is getting a bit worse, patient is also having difficulty managing finances, especially where technology is involved as she keeps forgetting her passwords to banking programs, getting frustrated with new computer system with Windows upgrade, not seeming to understand when printer just needs new ink cartridge she just bought an entirely new printer thinking that the other one was broken.  Is not wandering, knows family members.  Reports persistent vertigo, following with neurology.     ASSESSMENT & PLAN with other pertinent findings:  The primary encounter diagnosis was Hypothyroidism, unspecified type. Diagnoses of Vertigo, Osteoporosis without current pathological fracture, unspecified osteoporosis type, and Mild cognitive impairment were also pertinent to this visit.   Filling medications as below, decreasing chlorthalidone, she is taking this twice per week for mild edema, reducing dose to hopefully improve dizziness though this does not seem to be a major contributing factor to her symptoms.  Not sure if she has been consistently taking Lexapro, daughter does have some concerns about decline in mental health/increased depression.  Patient advised to be taking the Lexapro consistently.  Daughter to help monitor medication adherence.  There are no Patient Instructions on file for this visit.  Orders Placed This Encounter  Procedures  . CBC  . COMPLETE METABOLIC PANEL WITH GFR  . TSH  . Vitamin B12    Meds ordered this encounter  Medications  . chlorthalidone (HYGROTEN) 15 MG tablet    Sig: Take 1 tablet (15 mg total) by mouth 2 (two) times a week. Tuesdays and Fridays once a day.    Dispense:   30 tablet    Refill:  1  . escitalopram (LEXAPRO) 5 MG tablet    Sig: Take 1 tablet (5 mg total) by mouth at bedtime.    Dispense:  90 tablet    Refill:  3    This prescription was filled on 09/21/2019. Any refills authorized will be placed on file.  [name] amLODipine (NORVASC) 5 MG tablet    Sig: Take 1 tablet (5 mg total) by mouth daily.    Dispense:  90 tablet    Refill:  3    Needs to schedule an appt before next refill.  . losartan (COZAAR) 25 MG tablet    Sig: Take 1 tablet (25 mg total) by mouth daily.    Dispense:  90 tablet    Refill:  3  . levothyroxine (SYNTHROID) 50 MCG tablet    Sig: Take 1 tablet (50 mcg total) by mouth daily.    Dispense:  90 tablet    Refill:  0     See below for relevant physical exam findings  See below for recent lab and imaging results reviewed  Medications, allergies, PMH, PSH, SocH, FamH reviewed below    Follow-up instructions: Return in about 6 weeks (around 09/28/2020) for VIRTUAL *OR* IN-OFFICE VISIT, [place] .                                        Exam:  BP (!) 150/60 (BP Location: Left Arm, Patient Position: Sitting, Cuff Size: Normal)   Pulse 79  Temp 98.5 F (36.9 C) (Oral)   Wt 140 lb 1.9 oz (63.6 kg)   BMI 26.48 kg/m   Constitutional: VS see above. General Appearance: alert, well-developed, well-nourished, NAD  Neck: No masses, trachea midline.   Respiratory: Normal respiratory effort. no wheeze, no rhonchi, no rales  Cardiovascular: S1/S2 normal, no murmur, no rub/gallop auscultated. RRR.   Musculoskeletal: Gait normal. Symmetric and independent movement of all extremities  Abdominal: non-tender, non-distended, no appreciable  Neurological: Normal balance/coordination. No tremor.  Skin: warm, dry, intact.   Psychiatric: Normal judgment/insight. Normal mood and affect. Oriented x3.   Current Meds  Medication Sig  . Ascorbic Acid (VITAMIN C) 1000 MG tablet Take  1,000 mg by mouth daily.  [name] aspirin EC 81 MG tablet Take 1 tablet (81 mg total) by mouth daily.  . Coenzyme Q10 (COQ10) 100 MG CAPS Take 100 mg by mouth daily.   . Cyanocobalamin (VITAMIN B-12 PO) Take 1 tablet by mouth every other day.  . diazepam (VALIUM) 2 MG tablet Take 0.5-1 tablets (1-2 mg total) by mouth every 8 (eight) hours as needed (dizziness/severe anxiety). #30 for 90 days  . gabapentin (NEURONTIN) 100 MG capsule   . Multiple Vitamin (MULTIVITAMIN WITH MINERALS) TABS tablet Take 1 tablet by mouth daily.  [name] oxycodone (OXY-IR) 5 MG capsule Take 1 capsule (5 mg total) by mouth every 4 (four) hours as needed (severe pain).  . Red Yeast Rice 600 MG CAPS Take 600 mg by mouth daily.   . timolol (TIMOPTIC) 0.25 % ophthalmic solution Place 1 drop into both eyes 2 (two) times daily.   . [DISCONTINUED] amLODipine (NORVASC) 5 MG tablet TAKE ONE TABLET BY MOUTH EVERY DAY  . [DISCONTINUED] chlorthalidone (HYGROTON) 25 MG tablet Take 25 mg by mouth. Tuesdays and Fridays once a day.  . [DISCONTINUED] escitalopram (LEXAPRO) 5 MG tablet TAKE ONE TABLET BY MOUTH at bedtime  . [DISCONTINUED] FLUZONE HIGH-DOSE QUADRIVALENT 0.7 ML SUSY   . [DISCONTINUED] levothyroxine (SYNTHROID) 50 MCG tablet Take 1 tablet (50 mcg total) by mouth daily.  . [DISCONTINUED] losartan (COZAAR) 25 MG tablet Take 25 mg by mouth daily.  . [DISCONTINUED] pantoprazole (PROTONIX) 20 MG tablet Take 1 tablet (20 mg total) by mouth daily.    Allergies  Allergen Reactions  . Alendronate Sodium     Esophagitis   . Ace Inhibitors     Cough  . Ezetimibe Other (See Comments)    Unknown rxn  . Fenofibrate     Arthralgia   . Rosuvastatin Calcium     Muscle aches  . Statins Other (See Comments)    Muscle aches    Patient Active Problem List   Diagnosis Date Noted  . Elevated vitamin B12 level 03/24/2019  . High serum vitamin B12 02/18/2019  . PAD (peripheral artery disease) ([place]) 12/04/2018  . [place] esophagus  11/26/2018  . Schatzki's ring of distal esophagus 11/26/2018  . Hiatal hernia 11/26/2018  . Spinal stenosis of lumbar region at multiple levels 03/13/2018  . Advance directive discussed with patient 01/16/2018  . Memory loss 01/16/2018  . Chronic venous insufficiency 09/26/2017  . Elevated blood pressure reading with diagnosis of hypertension 09/26/2017  . Ulcer of left lower extremity with fat layer exposed ([place]) 09/26/2017  . Renal cyst, left 07/18/2017  . Chronic pain syndrome 05/25/2017  . Degenerative scoliosis 05/15/2017  . Degenerative spondylolisthesis 05/15/2017  . Neuropathic pain 05/15/2017  . Orthostatic dizziness 03/07/2017  . CKD (chronic kidney disease) stage 4, GFR 15-29 ml/min (HCC)  02/23/2016  . Trochanteric bursitis of left hip 01/17/2016  . Left ankle pain 01/17/2016  . Hypothyroidism 07/05/2015  . Osteoporosis 05/26/2015  . Cataract 05/26/2015  . Bilateral knee pain 05/26/2015  . Chronic low back pain 05/26/2015  . Essential hypertension 05/26/2015  . Allergic rhinitis due to pollen 02/14/2015  . Gastroesophageal reflux disease without esophagitis 02/14/2015  . History of esophageal stricture 02/14/2015  . Carotid bruit 08/28/2011  . Hyperlipidemia, unspecified 01/22/2011    Family History  Problem Relation Age of Onset  . Stroke Mother   . Lung cancer Father   . Heart disease Sister   . Hypertension Sister   . Hypertension Daughter   . Rheum arthritis Daughter   . Fibromyalgia Daughter   . Colon cancer Neg Hx   . Esophageal cancer Neg Hx     Social History   Tobacco Use  Smoking Status Former Smoker  . Years: 40.00  . Types: Cigarettes  Smokeless Tobacco Never Used  Tobacco Comment   quit 2003    Past Surgical History:  Procedure Laterality Date  . ABDOMINAL AORTOGRAM W/LOWER EXTREMITY Left 12/29/2018   Procedure: ABDOMINAL AORTOGRAM W/LOWER EXTREMITY;  Surgeon: Waynetta Sandy, MD;  Location: [facility];  Service:  Cardiovascular;  Laterality: Left;  . ABDOMINAL HYSTERECTOMY    . COLONOSCOPY  2015   [facility]  . ENDOVENOUS ABLATION SAPHENOUS VEIN W/ LASER Right 12/27/2016   endovenous laser ablation right greater saphenous vein by [name] MD  . ESOPHAGOGASTRODUODENOSCOPY  2015   x2 or 3 times in [place]  . KNEE ARTHROSCOPY    . PERIPHERAL VASCULAR ATHERECTOMY  12/29/2018   Procedure: PERIPHERAL VASCULAR ATHERECTOMY;  Surgeon: Waynetta Sandy, MD;  Location: [facility];  Service: Cardiovascular;;  left SFA  . PERIPHERAL VASCULAR BALLOON ANGIOPLASTY  12/29/2018   Procedure: PERIPHERAL VASCULAR BALLOON ANGIOPLASTY;  Surgeon: Waynetta Sandy, MD;  Location: [facility];  Service: Cardiovascular;;  Left SFA  . VARICOSE VEIN SURGERY      Immunization History  Administered Date(s) Administered  . Fluad Quad(high Dose 65+) 01/20/2019  . Influenza Split 03/02/2009, 02/06/2010, 01/31/2011  . Influenza, High Dose Seasonal PF 03/07/2017, 03/24/2018  . Influenza, Seasonal, Injecte, Preservative Fre 02/10/2014  . Influenza,inj,Quad PF,6+ Mos 06/02/2015, 02/23/2016  . Influenza,inj,quad, With Preservative 05/14/2016  . Influenza-Unspecified 02/13/2012, 03/08/2020  . PFIZER(Purple Top)SARS-COV-2 Vaccination 07/12/2019, 08/11/2019, 03/08/2020  . Pneumococcal Conjugate-13 03/24/2014  . Pneumococcal Polysaccharide-23 02/13/2012  . Td 04/26/2009  . Tdap 11/19/2013  . Zoster 03/25/2009    Recent Results (from the past 2160 hour(s))  CBC     Status: None   Collection Time: 08/17/20  1:43 PM  Result Value Ref Range   WBC 6.6 3.8 - 10.8 Thousand/uL   RBC 3.92 3.80 - 5.10 Million/uL   Hemoglobin 12.4 11.7 - 15.5 g/dL   HCT 37.6 35.0 - 45.0 %   MCV 95.9 80.0 - 100.0 fL   MCH 31.6 27.0 - 33.0 pg   MCHC 33.0 32.0 - 36.0 g/dL   RDW 12.6 11.0 - 15.0 %   Platelets 293 140 - 400 Thousand/uL   MPV 9.9 7.5 - 12.5 fL  COMPLETE METABOLIC PANEL WITH GFR      Status: Abnormal   Collection Time: 08/17/20  1:43 PM  Result Value Ref Range   Glucose, Bld 79 65 - 139 mg/dL    Comment: .        Non-fasting reference interval .  BUN 38 (H) 7 - 25 mg/dL   Creat 1.84 (H) 0.60 - 0.88 mg/dL    Comment: For patients >67 years of age, the reference limit for Creatinine is approximately 13% higher for people identified as African-American. .    GFR, Est Non African American 25 (L) > OR = 60 mL/min/1.49m   GFR, Est African American 28 (L) > OR = 60 mL/min/1.747m  BUN/Creatinine Ratio 21 6 - 22 (calc)   Sodium 142 135 - 146 mmol/L   Potassium 4.7 3.5 - 5.3 mmol/L   Chloride 106 98 - 110 mmol/L   CO2 27 20 - 32 mmol/L   Calcium 9.7 8.6 - 10.4 mg/dL   Total Protein 7.0 6.1 - 8.1 g/dL   Albumin 4.4 3.6 - 5.1 g/dL   Globulin 2.6 1.9 - 3.7 g/dL (calc)   AG Ratio 1.7 1.0 - 2.5 (calc)   Total Bilirubin 0.4 0.2 - 1.2 mg/dL   Alkaline phosphatase (APISO) 73 37 - 153 U/L   AST 22 10 - 35 U/L   ALT 12 6 - 29 U/L  TSH     Status: None   Collection Time: 08/17/20  1:43 PM  Result Value Ref Range   TSH 3.43 0.40 - 4.50 mIU/L  Vitamin B12     Status: None   Collection Time: 08/17/20  1:43 PM  Result Value Ref Range   Vitamin B-12 925 200 - 1,100 pg/mL    No results found.     All questions at time of visit were answered - patient instructed to contact office with any additional concerns or updates. ER/RTC precautions were reviewed with the patient as applicable.   Please note: manual typing as well as voice recognition software may have been used to produce this document - typos may escape review. Please contact Dr. AlSheppard Coilor any needed clarifications.

## 2020-08-18 LAB — COMPLETE METABOLIC PANEL WITH GFR
AG Ratio: 1.7 (calc) (ref 1.0–2.5)
ALT: 12 U/L (ref 6–29)
AST: 22 U/L (ref 10–35)
Albumin: 4.4 g/dL (ref 3.6–5.1)
Alkaline phosphatase (APISO): 73 U/L (ref 37–153)
BUN/Creatinine Ratio: 21 (calc) (ref 6–22)
BUN: 38 mg/dL — ABNORMAL HIGH (ref 7–25)
CO2: 27 mmol/L (ref 20–32)
Calcium: 9.7 mg/dL (ref 8.6–10.4)
Chloride: 106 mmol/L (ref 98–110)
Creat: 1.84 mg/dL — ABNORMAL HIGH (ref 0.60–0.88)
GFR, Est African American: 28 mL/min/{1.73_m2} — ABNORMAL LOW (ref >=60)
GFR, Est Non African American: 25 mL/min/{1.73_m2} — ABNORMAL LOW (ref >=60)
Globulin: 2.6 g/dL (calc) (ref 1.9–3.7)
Glucose, Bld: 79 mg/dL (ref 65–139)
Potassium: 4.7 mmol/L (ref 3.5–5.3)
Sodium: 142 mmol/L (ref 135–146)
Total Bilirubin: 0.4 mg/dL (ref 0.2–1.2)
Total Protein: 7 g/dL (ref 6.1–8.1)

## 2020-08-18 LAB — VITAMIN B12: Vitamin B-12: 925 pg/mL (ref 200–1100)

## 2020-08-18 LAB — CBC
HCT: 37.6 % (ref 35.0–45.0)
Hemoglobin: 12.4 g/dL (ref 11.7–15.5)
MCH: 31.6 pg (ref 27.0–33.0)
MCHC: 33 g/dL (ref 32.0–36.0)
MCV: 95.9 fL (ref 80.0–100.0)
MPV: 9.9 fL (ref 7.5–12.5)
Platelets: 293 10*3/uL (ref 140–400)
RBC: 3.92 10*6/uL (ref 3.80–5.10)
RDW: 12.6 % (ref 11.0–15.0)
WBC: 6.6 10*3/uL (ref 3.8–10.8)

## 2020-08-18 LAB — TSH: TSH: 3.43 mIU/L (ref 0.40–4.50)

## 2020-08-19 ENCOUNTER — Telehealth: Payer: Self-pay

## 2020-08-19 NOTE — Telephone Encounter (Signed)
Prior authorization for [employer] submitted to insurance. Pending a determination.

## 2020-09-28 ENCOUNTER — Other Ambulatory Visit: Payer: Self-pay

## 2020-09-28 ENCOUNTER — Ambulatory Visit (INDEPENDENT_AMBULATORY_CARE_PROVIDER_SITE_OTHER): Payer: Medicare Other | Admitting: Osteopathic Medicine

## 2020-09-28 ENCOUNTER — Encounter: Payer: Self-pay | Admitting: Osteopathic Medicine

## 2020-09-28 VITALS — BP 139/62 | HR 74 | Temp 99.1°F | Wt 138.1 lb

## 2020-09-28 DIAGNOSIS — R4189 Other symptoms and signs involving cognitive functions and awareness: Secondary | ICD-10-CM

## 2020-09-28 DIAGNOSIS — R413 Other amnesia: Secondary | ICD-10-CM

## 2020-09-28 DIAGNOSIS — Z7189 Other specified counseling: Secondary | ICD-10-CM

## 2020-09-28 DIAGNOSIS — F4381 Prolonged grief disorder: Secondary | ICD-10-CM

## 2020-09-28 DIAGNOSIS — F4329 Adjustment disorder with other symptoms: Secondary | ICD-10-CM

## 2020-09-28 DIAGNOSIS — F339 Major depressive disorder, recurrent, unspecified: Secondary | ICD-10-CM

## 2020-09-28 MED ORDER — ESCITALOPRAM OXALATE 5 MG PO TABS
10.0000 mg | ORAL_TABLET | Freq: Every day | ORAL | 3 refills | Status: DC
Start: 2020-09-28 — End: 2020-11-01

## 2020-09-28 NOTE — Progress Notes (Signed)
Laura Ellis is a 87 y.o. female who presents to  [place] at [facility]  today, 09/28/20, seeking care for the following:  . Following up mental health: was not consistent about Lexapro and daughter noted patient was more depressed. Has been taking consistently over the past month and very little improvement. Patient cites depression at the [place] of her husband a few years ago, she blames herself thinking she should have done more for him. She also cites frustration that her daughters don't trust her to handle her finances. She has mild dementia. She understands this condition but is reluctant to reliquish control of her finances despite issues with being scammed, frequently losing passwords to accounts, and daughters are concerned about her online activities particularly chatinns with strangers on the phone and on facebook.     Depression screen [facility] 2/9 09/28/2020 10/19/2019 09/01/2018  Decreased Interest 2 3 0  Down, Depressed, Hopeless 3 0 0  PHQ - 2 Score 5 3 0  Altered sleeping 0 0 -  Tired, decreased energy 0 (better actually w/ physical therapy) 3 -  Change in appetite 1 (loss of appetite and weight loss) 3 -  Feeling bad or failure about yourself  3 3 -  Trouble concentrating 0 3 -  Moving slowly or fidgety/restless 3 ("speaking slowly") 2 -  Suicidal thoughts 3 ("better off dead") 1 -  PHQ-9 Score 15 18 -  Difficult doing work/chores Very difficult Very difficult -    GAD 7 : Generalized Anxiety Score 09/28/2020 10/19/2019 01/16/2018 07/18/2017  Nervous, Anxious, on Edge 0 0 0 0  Control/stop worrying 1 0 0 0  Worry too much - different things 1 (worries a lot about finances, about her cats) 2 0 0  Trouble relaxing 0 0 0 0  Restless 0 0 0 0  Easily annoyed or irritable 2 1 0 0  Afraid - awful might happen 0 0 0 0  Total GAD 7 Score 4 3 0 0  Anxiety Difficulty Somewhat difficult Very difficult - Not difficult at all         ASSESSMENT & PLAN with other pertinent findings:  The primary encounter diagnosis was Cognitive impairment. Diagnoses of Advance directive discussed with patient, Memory loss, Depression, recurrent ([place]), and Prolonged grief disorder were also pertinent to this visit.    Patient Instructions  Thalitone and Chlorthalidone are same medication Should be taking the one I sent in: 15 mg twice weekly   I think it's potentially dangerous for Mrs. Lwin to be managing finances independently and I believe her neurologist would agree that the dementia diagnosis means it's time to give up at least some control of major finances. Would recommend the family consult a lawyer to establish financial power of attorney.   OK to increase Lexapro from 5 mg to 10 mg. If this is helping with depression, nothing more to do. If depression still an issue, can add Wellbutrin. I've also sent referral to counselor to discuss moods/grief and depression.        Orders Placed This Encounter  Procedures  . Ambulatory referral to [place] ordered this encounter  Medications  . escitalopram (LEXAPRO) 5 MG tablet    Sig: Take 2 tablets (10 mg total) by mouth at bedtime.    Dispense:  90 tablet    Refill:  3    This prescription was filled on 09/21/2019. Any refills authorized will be placed on file.  See below for relevant physical exam findings  See below for recent lab and imaging results reviewed  Medications, allergies, PMH, PSH, SocH, [place] reviewed below    Follow-up instructions: Return in about 4 weeks (around 10/26/2020) for [facility] .                                        Exam:  BP 139/62 (BP Location: Left Arm, Patient Position: Sitting, Cuff Size: Normal)   Pulse 74   Temp 99.1 F (37.3 C) (Oral)   Wt 138 lb 1.9 oz (62.7 kg)   BMI 26.10 kg/m   Constitutional: VS see above. General Appearance: alert,  well-developed, well-nourished, NAD  Neck: No masses, trachea midline.   Respiratory: Normal respiratory effort. no wheeze, no rhonchi, no rales  Cardiovascular: S1/S2 normal, RRR  Musculoskeletal: Gait normal.  Neurological: Normal balance/coordination. No tremor.  Skin: warm, dry, intact.   Psychiatric: Fair judgment/insight. Normal mood and affect. Oriented x person, place.   Current Meds  Medication Sig  . amLODipine (NORVASC) 5 MG tablet Take 1 tablet (5 mg total) by mouth daily.  . Ascorbic Acid (VITAMIN C) 1000 MG tablet Take 1,000 mg by mouth daily.  [name] aspirin EC 81 MG tablet Take 1 tablet (81 mg total) by mouth daily.  . chlorthalidone (HYGROTEN) 15 MG tablet Take 1 tablet (15 mg total) by mouth 2 (two) times a week. Tuesdays and Fridays once a day.  . Coenzyme Q10 (COQ10) 100 MG CAPS Take 100 mg by mouth daily.   . Cyanocobalamin (VITAMIN B-12 PO) Take 1 tablet by mouth every other day.  . diazepam (VALIUM) 2 MG tablet Take 0.5-1 tablets (1-2 mg total) by mouth every 8 (eight) hours as needed (dizziness/severe anxiety). #30 for 90 days  . gabapentin (NEURONTIN) 100 MG capsule   . levothyroxine (SYNTHROID) 50 MCG tablet Take 1 tablet (50 mcg total) by mouth daily.  [name] losartan (COZAAR) 25 MG tablet Take 1 tablet (25 mg total) by mouth daily.  . Multiple Vitamin (MULTIVITAMIN WITH MINERALS) TABS tablet Take 1 tablet by mouth daily.  [name] oxycodone (OXY-IR) 5 MG capsule Take 1 capsule (5 mg total) by mouth every 4 (four) hours as needed (severe pain).  . Red Yeast Rice 600 MG CAPS Take 600 mg by mouth daily.   . timolol (TIMOPTIC) 0.25 % ophthalmic solution Place 1 drop into both eyes 2 (two) times daily.   . [DISCONTINUED] escitalopram (LEXAPRO) 5 MG tablet Take 1 tablet (5 mg total) by mouth at bedtime.    Allergies  Allergen Reactions  . Alendronate Sodium     Esophagitis   . Ace Inhibitors     Cough  . Ezetimibe Other (See Comments)    Unknown rxn  . Fenofibrate      Arthralgia   . Rosuvastatin Calcium     Muscle aches  . Statins Other (See Comments)    Muscle aches    Patient Active Problem List   Diagnosis Date Noted  . Elevated vitamin B12 level 03/24/2019  . High serum vitamin B12 02/18/2019  . PAD (peripheral artery disease) ([place]) 12/04/2018  . 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 Schatzki's ring of distal esophagus 11/26/2018  . Hiatal hernia 11/26/2018  . Spinal stenosis of lumbar region at multiple levels 03/13/2018  . Advance directive discussed with patient 01/16/2018  . Memory loss 01/16/2018  . Chronic venous  insufficiency 09/26/2017  . Elevated blood pressure reading with diagnosis of hypertension 09/26/2017  . Ulcer of left lower extremity with fat layer exposed ([place]) 09/26/2017  . Renal cyst, left 07/18/2017  . Chronic pain syndrome 05/25/2017  . Degenerative scoliosis 05/15/2017  . Degenerative spondylolisthesis 05/15/2017  . Neuropathic pain 05/15/2017  . Orthostatic dizziness 03/07/2017  . CKD (chronic kidney disease) stage 4, GFR 15-29 ml/min (HCC) 02/23/2016  . Trochanteric bursitis of left hip 01/17/2016  . Left ankle pain 01/17/2016  . Hypothyroidism 07/05/2015  . Osteoporosis 05/26/2015  . Cataract 05/26/2015  . Bilateral knee pain 05/26/2015  . Chronic low back pain 05/26/2015  . Essential hypertension 05/26/2015  . Allergic rhinitis due to pollen 02/14/2015  . Gastroesophageal reflux disease without esophagitis 02/14/2015  . History of esophageal stricture 02/14/2015  . Carotid bruit 08/28/2011  . Hyperlipidemia, unspecified 01/22/2011    Family History  Problem Relation Age of Onset  . Stroke Mother   . Lung cancer Father   . Heart disease Sister   . Hypertension Sister   . Hypertension Daughter   . Rheum arthritis Daughter   . Fibromyalgia Daughter   . Colon cancer Neg Hx   . Esophageal cancer Neg Hx     Social History   Tobacco Use  Smoking Status Former Smoker  . Years: 40.00  . Types:  Cigarettes  Smokeless Tobacco Never Used  Tobacco Comment   quit 2003    Past Surgical History:  Procedure Laterality Date  . ABDOMINAL AORTOGRAM W/LOWER EXTREMITY Left 12/29/2018   Procedure: ABDOMINAL AORTOGRAM W/LOWER EXTREMITY;  Surgeon: Waynetta Sandy, MD;  Location: [place] [facility];  Service: Cardiovascular;  Laterality: Left;  . ABDOMINAL HYSTERECTOMY    . COLONOSCOPY  2015   [facility]  . ENDOVENOUS ABLATION SAPHENOUS VEIN W/ LASER Right 12/27/2016   endovenous laser ablation right greater saphenous vein by [name] MD  . ESOPHAGOGASTRODUODENOSCOPY  2015   x2 or 3 times in [place]  . KNEE ARTHROSCOPY    . PERIPHERAL VASCULAR ATHERECTOMY  12/29/2018   Procedure: PERIPHERAL VASCULAR ATHERECTOMY;  Surgeon: Waynetta Sandy, MD;  Location: [place] [facility];  Service: Cardiovascular;;  left SFA  . PERIPHERAL VASCULAR BALLOON ANGIOPLASTY  12/29/2018   Procedure: PERIPHERAL VASCULAR BALLOON ANGIOPLASTY;  Surgeon: Waynetta Sandy, MD;  Location: [place] [facility];  Service: Cardiovascular;;  Left SFA  . VARICOSE VEIN SURGERY      Immunization History  Administered Date(s) Administered  . Fluad Quad(high Dose 65+) 01/20/2019  . Influenza Split 03/02/2009, 02/06/2010, 01/31/2011  . Influenza, High Dose Seasonal PF 03/07/2017, 03/24/2018  . Influenza, Seasonal, Injecte, Preservative Fre 02/10/2014  . Influenza,inj,Quad PF,6+ Mos 06/02/2015, 02/23/2016  . Influenza,inj,quad, With Preservative 05/14/2016  . Influenza-Unspecified 02/13/2012, 03/08/2020  . PFIZER(Purple Top)SARS-COV-2 Vaccination 07/12/2019, 08/11/2019, 03/08/2020  . Pneumococcal Conjugate-13 03/24/2014  . Pneumococcal Polysaccharide-23 02/13/2012  . Td 04/26/2009  . Tdap 11/19/2013  . Zoster 03/25/2009    Recent Results (from the past 2160 hour(s))  CBC     Status: None   Collection Time: 08/17/20  1:43 PM  Result Value Ref Range   WBC 6.6 3.8 - 10.8  Thousand/uL   RBC 3.92 3.80 - 5.10 Million/uL   Hemoglobin 12.4 11.7 - 15.5 g/dL   HCT 37.6 35.0 - 45.0 %   MCV 95.9 80.0 - 100.0 fL   MCH 31.6 27.0 - 33.0 pg   MCHC 33.0 32.0 - 36.0 g/dL   RDW 12.6 11.0 -  15.0 %   Platelets 293 140 - 400 Thousand/uL   MPV 9.9 7.5 - 12.5 fL  COMPLETE METABOLIC PANEL WITH GFR     Status: Abnormal   Collection Time: 08/17/20  1:43 PM  Result Value Ref Range   Glucose, Bld 79 65 - 139 mg/dL    Comment: .        Non-fasting reference interval .    BUN 38 (H) 7 - 25 mg/dL   Creat 1.84 (H) 0.60 - 0.88 mg/dL    Comment: For patients >69 years of age, the reference limit for Creatinine is approximately 13% higher for people identified as African-American. .    GFR, Est Non African American 25 (L) > OR = 60 mL/min/1.39m   GFR, Est African American 28 (L) > OR = 60 mL/min/1.775m  BUN/Creatinine Ratio 21 6 - 22 (calc)   Sodium 142 135 - 146 mmol/L   Potassium 4.7 3.5 - 5.3 mmol/L   Chloride 106 98 - 110 mmol/L   CO2 27 20 - 32 mmol/L   Calcium 9.7 8.6 - 10.4 mg/dL   Total Protein 7.0 6.1 - 8.1 g/dL   Albumin 4.4 3.6 - 5.1 g/dL   Globulin 2.6 1.9 - 3.7 g/dL (calc)   AG Ratio 1.7 1.0 - 2.5 (calc)   Total Bilirubin 0.4 0.2 - 1.2 mg/dL   Alkaline phosphatase (APISO) 73 37 - 153 U/L   AST 22 10 - 35 U/L   ALT 12 6 - 29 U/L  TSH     Status: None   Collection Time: 08/17/20  1:43 PM  Result Value Ref Range   TSH 3.43 0.40 - 4.50 mIU/L  Vitamin B12     Status: None   Collection Time: 08/17/20  1:43 PM  Result Value Ref Range   Vitamin B-12 925 200 - 1,100 pg/mL    No results found.     All questions at time of visit were answered - patient instructed to contact office with any additional concerns or updates. ER/RTC precautions were reviewed with the patient as applicable.   Please note: manual typing as well as voice recognition software may have been used to produce this document - typos may escape review. Please contact Dr. AlSheppard Coilor  any needed clarifications.   Total encounter time on date of service, 09/28/20, was 40 minutes spent addressing problems/issues as noted above in [place] time spent in discussion with patient regarding the HPI, ROS, confirming history, reviewing Assessment & Plan, as well as time spent on coordination of care, record review.

## 2020-09-28 NOTE — Patient Instructions (Signed)
Thalitone and Chlorthalidone are same medication Should be taking the one I sent in: 15 mg twice weekly   I think it's potentially dangerous for Laura Ellis to be managing finances independently and I believe her neurologist would agree that the dementia diagnosis means it's time to give up at least some control of major finances. Would recommend the family consult a lawyer to establish financial power of attorney.   OK to increase Lexapro from 5 mg to 10 mg. If this is helping with depression, nothing more to do. If depression still an issue, can add Wellbutrin. I've also sent referral to counselor to discuss moods/grief and depression.

## 2020-09-29 ENCOUNTER — Telehealth: Payer: Self-pay | Admitting: Osteopathic Medicine

## 2020-09-29 NOTE — Chronic Care Management (AMB) (Signed)
  Chronic Care Management   Outreach Note  09/29/2020 Name: Laura Ellis MRN: [id number] DOB: 28-Dec-1932  Referred by: Emeterio Reeve, DO Reason for referral : No chief complaint on file.   An unsuccessful telephone outreach was attempted today. The patient was referred to the pharmacist for assistance with care management and care coordination.   Follow Up Plan:   [name] Upstream Scheduler

## 2020-09-30 NOTE — Telephone Encounter (Signed)
Prior authorization for [employer] approved by insurance. Approved exception valid until 05/13/2021.

## 2020-10-03 ENCOUNTER — Ambulatory Visit: Payer: Medicare Other | Admitting: Osteopathic Medicine

## 2020-10-13 ENCOUNTER — Telehealth: Payer: Self-pay | Admitting: Osteopathic Medicine

## 2020-10-13 NOTE — Progress Notes (Signed)
  Chronic Care Management   Outreach Note  10/13/2020 Name: Laura Ellis MRN: [id number] DOB: 07-26-1932  Referred by: Emeterio Reeve, DO Reason for referral : No chief complaint on file.   A second unsuccessful telephone outreach was attempted today. The patient was referred to pharmacist for assistance with care management and care coordination.  Follow Up Plan:   [name] Upstream Scheduler

## 2020-10-26 ENCOUNTER — Ambulatory Visit: Payer: Medicare Other | Admitting: Osteopathic Medicine

## 2020-10-28 ENCOUNTER — Telehealth: Payer: Self-pay

## 2020-10-28 NOTE — Telephone Encounter (Signed)
Patient called requesting to reschedule her appointment that she missed earlier this week. Please contact patient for scheduling. Thanks in advance.

## 2020-10-31 NOTE — Telephone Encounter (Signed)
Please review the previous note. Thanks in advance.

## 2020-10-31 NOTE — Telephone Encounter (Signed)
Laura Ellis has rescheduled patient for tomorrow with PCP. AM

## 2020-11-01 ENCOUNTER — Telehealth: Payer: Self-pay | Admitting: Osteopathic Medicine

## 2020-11-01 ENCOUNTER — Ambulatory Visit (INDEPENDENT_AMBULATORY_CARE_PROVIDER_SITE_OTHER): Payer: Medicare Other | Admitting: Osteopathic Medicine

## 2020-11-01 ENCOUNTER — Other Ambulatory Visit: Payer: Self-pay

## 2020-11-01 VITALS — BP 137/81 | HR 63 | Temp 97.9°F | Wt 138.0 lb

## 2020-11-01 DIAGNOSIS — Z9889 Other specified postprocedural states: Secondary | ICD-10-CM

## 2020-11-01 DIAGNOSIS — M810 Age-related osteoporosis without current pathological fracture: Secondary | ICD-10-CM

## 2020-11-01 DIAGNOSIS — R1310 Dysphagia, unspecified: Secondary | ICD-10-CM

## 2020-11-01 MED ORDER — ESCITALOPRAM OXALATE 10 MG PO TABS
10.0000 mg | ORAL_TABLET | Freq: Every day | ORAL | 3 refills | Status: DC
Start: 2020-11-01 — End: 2021-01-02

## 2020-11-01 NOTE — Telephone Encounter (Signed)
No PA is required for Prolia under her plan. I tried to call patient to schedule. Phone rang, no answer.   Does she need to have labs redone? Last CMP was in April.

## 2020-11-01 NOTE — Telephone Encounter (Signed)
Intolerant to fosamax, can we Rx prolia

## 2020-11-01 NOTE — Telephone Encounter (Signed)
Unable to reach patient.

## 2020-11-01 NOTE — Telephone Encounter (Signed)
Yes will need labs - I've ordered bone density test, she's due for that anyway. Can get labs done when she's here for bone density test (blood draw either up here or at [facility] downstairs is fine).

## 2020-11-01 NOTE — Progress Notes (Signed)
Laura Ellis is a 87 y.o. female who presents to  [place] at [facility]  today, 11/01/20, seeking care for the following:  Last visit about a month ago, we discussed increasing Lexapro from 5 mg to 10 mg. Consider adding Wellbutrin if still no benefit with higher dose SSRI. Today patient reports doing much better since last visit and daughter agrees there is a positive difference in mood.  Only other complaint today is occasional pain w/ swallowing, has history of esophageal dilatation and feels similar to how she did before that procedure. Requests GI referral, thinks she saw Dr Shary Key in the past      [place] with other pertinent findings:  The primary encounter diagnosis was History of esophageal dilatation. Diagnoses of Dysphagia, unspecified type and Age-related osteoporosis without current pathological fracture were also pertinent to this visit.   Continue Lexapro 10 mg  Will get Prolia arranged    There are no Patient Instructions on file for this visit.  Orders Placed This Encounter  Procedures   DG Bone Density   Ambulatory referral to Gastroenterology    Meds ordered this encounter  Medications   escitalopram (LEXAPRO) 10 MG tablet    Sig: Take 1 tablet (10 mg total) by mouth daily.    Dispense:  90 tablet    Refill:  3    REPLACES LEXAPRO 5 MG DAILY - PLEASE CANCEL 5 MG DOSE THANKS     See below for relevant physical exam findings  See below for recent lab and imaging results reviewed  Medications, allergies, PMH, PSH, SocH, FamH reviewed below    Follow-up instructions: Return in about 6 months (around 05/03/2021) for ROUTINE CHECK UP, SEE [place] SOONER IF NEEDED. CALL/MESSAGE W/ QUESTIONS.                                        Exam:  BP 137/81 (BP Location: Left Arm, Patient Position: Sitting, Cuff Size: Normal)   Pulse 63   Temp 97.9 F (36.6 C) (Oral)   Wt  138 lb 0.6 oz (62.6 kg)   BMI 26.08 kg/m  Constitutional: VS see above. General Appearance: alert, well-developed, well-nourished, NAD Neck: No masses, trachea midline.  Respiratory: Normal respiratory effort. no wheeze, no rhonchi, no rales Cardiovascular: S1/S2 normal, no rub/gallop auscultated. RRR.  Musculoskeletal: Gait normal. Symmetric and independent movement of all extremities Neurological: Normal balance/coordination. No tremor. Skin: warm, dry, intact.  Psychiatric: Normal judgment/insight. Normal mood and affect. Oriented x3.   Current Meds  Medication Sig   amLODipine (NORVASC) 5 MG tablet Take 1 tablet (5 mg total) by mouth daily.   Ascorbic Acid (VITAMIN C) 1000 MG tablet Take 1,000 mg by mouth daily.   aspirin EC 81 MG tablet Take 1 tablet (81 mg total) by mouth daily.   chlorthalidone (HYGROTEN) 15 MG tablet Take 1 tablet (15 mg total) by mouth 2 (two) times a week. Tuesdays and Fridays once a day.   Coenzyme Q10 (COQ10) 100 MG CAPS Take 100 mg by mouth daily.    Cyanocobalamin (VITAMIN B-12 PO) Take 1 tablet by mouth every other day.   diazepam (VALIUM) 2 MG tablet Take 0.5-1 tablets (1-2 mg total) by mouth every 8 (eight) hours as needed (dizziness/severe anxiety). #30 for 90 days   donepezil (ARICEPT) 5 MG tablet Take 5 mg by mouth at bedtime.  gabapentin (NEURONTIN) 100 MG capsule    levothyroxine (SYNTHROID) 50 MCG tablet Take 1 tablet (50 mcg total) by mouth daily.   losartan (COZAAR) 25 MG tablet Take 1 tablet (25 mg total) by mouth daily.   Multiple Vitamin (MULTIVITAMIN WITH MINERALS) TABS tablet Take 1 tablet by mouth daily.   oxycodone (OXY-IR) 5 MG capsule Take 1 capsule (5 mg total) by mouth every 4 (four) hours as needed (severe pain).   Red Yeast Rice 600 MG CAPS Take 600 mg by mouth daily.    timolol (TIMOPTIC) 0.25 % ophthalmic solution Place 1 drop into both eyes 2 (two) times daily.    [DISCONTINUED] escitalopram (LEXAPRO) 5 MG tablet Take 2 tablets  (10 mg total) by mouth at bedtime.    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   Schatzki's ring of distal esophagus 11/26/2018   Hiatal hernia 11/26/2018   Spinal stenosis of lumbar region at multiple levels 03/13/2018   Advance directive discussed with patient 01/16/2018   Memory loss 01/16/2018   Chronic venous insufficiency 09/26/2017   Elevated blood pressure reading with diagnosis of hypertension 09/26/2017   Ulcer of left lower extremity with fat layer exposed ([place]) 09/26/2017   Renal cyst, left 07/18/2017   Chronic pain syndrome 05/25/2017   Degenerative scoliosis 05/15/2017   Degenerative spondylolisthesis 05/15/2017   Neuropathic pain 05/15/2017   Orthostatic dizziness 03/07/2017   CKD (chronic kidney disease) stage 4, GFR 15-29 ml/min (HCC) 02/23/2016   Trochanteric bursitis of left hip 01/17/2016   Left ankle pain 01/17/2016   Hypothyroidism 07/05/2015   Osteoporosis 05/26/2015   Cataract 05/26/2015   Bilateral knee pain 05/26/2015   Chronic low back pain 05/26/2015   Essential hypertension 05/26/2015   Allergic rhinitis due to pollen 02/14/2015   Gastroesophageal reflux disease without esophagitis 02/14/2015   History of esophageal stricture 02/14/2015   Carotid bruit 08/28/2011   Hyperlipidemia, unspecified 01/22/2011    Family History  Problem Relation Age of Onset   Stroke Mother    Lung cancer Father    Heart disease Sister    Hypertension Sister    Hypertension Daughter    Rheum arthritis Daughter     Fibromyalgia Daughter    Colon cancer Neg Hx    Esophageal cancer Neg Hx     Social History   Tobacco Use  Smoking Status Former   Years: 40.00   Pack years: 0.00   Types: Cigarettes  Smokeless Tobacco Never  Tobacco Comments   quit 2003    Past Surgical History:  Procedure Laterality Date   ABDOMINAL AORTOGRAM W/LOWER EXTREMITY Left 12/29/2018   Procedure: ABDOMINAL AORTOGRAM W/LOWER EXTREMITY;  Surgeon: Waynetta Sandy, MD;  Location: [facility];  Service: Cardiovascular;  Laterality: Left;   ABDOMINAL HYSTERECTOMY     COLONOSCOPY  2015   [facility]   ENDOVENOUS ABLATION SAPHENOUS VEIN W/ LASER Right 12/27/2016   endovenous laser ablation right  greater saphenous vein by [name] MD   ESOPHAGOGASTRODUODENOSCOPY  2015   x2 or 3 times in    KNEE ARTHROSCOPY     PERIPHERAL VASCULAR ATHERECTOMY  12/29/2018   Procedure: PERIPHERAL VASCULAR ATHERECTOMY;  Surgeon: Waynetta Sandy, MD;  Location: [place] [facility];  Service: Cardiovascular;;  left SFA   PERIPHERAL VASCULAR BALLOON ANGIOPLASTY  12/29/2018   Procedure: PERIPHERAL VASCULAR BALLOON ANGIOPLASTY;  Surgeon: Waynetta Sandy, MD;  Location: [facility] [facility];  Service: Cardiovascular;;  Left SFA   VARICOSE VEIN SURGERY      Immunization History  Administered Date(s) Administered   Fluad Quad(high Dose 65+) 01/20/2019   Influenza Split 03/02/2009, 02/06/2010, 01/31/2011   Influenza, High Dose Seasonal PF 03/07/2017, 03/24/2018   Influenza, Seasonal, Injecte, Preservative Fre 02/10/2014   Influenza,inj,Quad PF,6+ Mos 06/02/2015, 02/23/2016   Influenza,inj,quad, With Preservative 05/14/2016   Influenza-Unspecified 02/13/2012, 03/08/2020   PFIZER(Purple Top)SARS-COV-2 Vaccination 07/12/2019, 08/11/2019, 03/08/2020   Pneumococcal Conjugate-13 03/24/2014   Pneumococcal Polysaccharide-23 02/13/2012   Td 04/26/2009   Tdap 11/19/2013   Zoster, Live  03/25/2009    Recent Results (from the past 2160 hour(s))  CBC     Status: None   Collection Time: 08/17/20  1:43 PM  Result Value Ref Range   WBC 6.6 3.8 - 10.8 Thousand/uL   RBC 3.92 3.80 - 5.10 Million/uL   Hemoglobin 12.4 11.7 - 15.5 g/dL   HCT 37.6 35.0 - 45.0 %   MCV 95.9 80.0 - 100.0 fL   MCH 31.6 27.0 - 33.0 pg   MCHC 33.0 32.0 - 36.0 g/dL   RDW 12.6 11.0 - 15.0 %   Platelets 293 140 - 400 Thousand/uL   MPV 9.9 7.5 - 12.5 fL  COMPLETE METABOLIC PANEL WITH GFR     Status: Abnormal   Collection Time: 08/17/20  1:43 PM  Result Value Ref Range   Glucose, Bld 79 65 - 139 mg/dL    Comment: .        Non-fasting reference interval .    BUN 38 (H) 7 - 25 mg/dL   Creat 1.84 (H) 0.60 - 0.88 mg/dL    Comment: For patients >80 years of age, the reference limit for Creatinine is approximately 13% higher for people identified as African-American. .    GFR, Est Non African American 25 (L) > OR = 60 mL/min/1.47m   GFR, Est African American 28 (L) > OR = 60 mL/min/1.724m  BUN/Creatinine Ratio 21 6 - 22 (calc)   Sodium 142 135 - 146 mmol/L   Potassium 4.7 3.5 - 5.3 mmol/L   Chloride 106 98 - 110 mmol/L   CO2 27 20 - 32 mmol/L   Calcium 9.7 8.6 - 10.4 mg/dL   Total Protein 7.0 6.1 - 8.1 g/dL   Albumin 4.4 3.6 - 5.1 g/dL   Globulin 2.6 1.9 - 3.7 g/dL (calc)   AG Ratio 1.7 1.0 - 2.5 (calc)   Total Bilirubin 0.4 0.2 - 1.2 mg/dL   Alkaline phosphatase (APISO) 73 37 - 153 U/L   AST 22 10 - 35 U/L   ALT 12 6 - 29 U/L  TSH     Status: None   Collection Time: 08/17/20  1:43 PM  Result Value Ref Range   TSH 3.43 0.40 - 4.50 mIU/L  Vitamin B12     Status: None   Collection Time: 08/17/20  1:43 PM  Result Value Ref Range   Vitamin B-12 925 200 - 1,100 pg/mL  No results found.     All questions at time of visit were answered - patient instructed to contact office with any additional concerns or updates. ER/RTC precautions were reviewed with the patient as applicable.    Please note: manual typing as well as voice recognition software may have been used to produce this document - typos may escape review. Please contact Dr. Sheppard Coil for any needed clarifications.

## 2020-11-07 ENCOUNTER — Other Ambulatory Visit: Payer: Self-pay | Admitting: Family Medicine

## 2020-11-07 ENCOUNTER — Other Ambulatory Visit: Payer: Self-pay | Admitting: Osteopathic Medicine

## 2020-11-07 MED ORDER — CHLORTHALIDONE 15 MG PO TABS
15.0000 mg | ORAL_TABLET | ORAL | 1 refills | Status: DC
Start: 2020-11-07 — End: 2021-02-28

## 2020-11-07 NOTE — Telephone Encounter (Signed)
Routing to covering provider. [facility] requesting med refill for Thalitone. Rx not listed in active med list. Pls advise. Thanks in advance.

## 2020-11-08 ENCOUNTER — Telehealth: Payer: Self-pay | Admitting: Osteopathic Medicine

## 2020-11-08 NOTE — Chronic Care Management (AMB) (Signed)
  Chronic Care Management   Note  11/08/2020 Name: Laura Ellis MRN: [id number] DOB: May 12, 1933  Laura Ellis is a 87 y.o. year old female who is a primary care patient of Emeterio Reeve, DO. I reached out to [employer] by phone today in response to a referral sent by Laura Ellis's PCP, Emeterio Reeve, DO.   Laura Ellis was given information about Chronic Care Management services today including:  CCM service includes personalized support from designated clinical staff supervised by her physician, including individualized plan of care and coordination with other care providers 24/7 contact phone numbers for assistance for urgent and routine care needs. Service will only be billed when office clinical staff spend 20 minutes or more in a month to coordinate care. Only one practitioner may furnish and bill the service in a calendar month. The patient may stop CCM services at any time (effective at the end of the month) by phone call to the office staff.   Patient agreed to services and verbal consent obtained.   Follow up plan:   [name] [facility] Scheduler

## 2020-11-15 NOTE — Telephone Encounter (Signed)
Patient scheduled, labs ordered. Patient is aware.

## 2020-11-18 ENCOUNTER — Telehealth: Payer: Self-pay | Admitting: Pharmacist

## 2020-11-18 NOTE — Chronic Care Management (AMB) (Signed)
Chronic Care Management Pharmacy Assistant   Name: Laura Ellis  MRN: [id number] DOB: 1932-07-16  Laura Ellis is an 87 y.o. year old female who presents for her initial CCM visit with the clinical pharmacist.  Reason for Encounter: Initial CCM Visit   Recent office visits:  11/01/20 Emeterio Reeve DO(PCP)- Patient seen for general follow up.Will get Prolia arranged . Patient was referred to Gastroenterology and Bone density tests were ordered. Follow up in 6 months.  09/28/20 Emeterio Reeve DO(PCP)- Patient was seen for follow up. Referral placed to behavioral health. Lexapro was increased to 10 mg daily. Follow up in 4 weeks.  08/17/20 Emeterio Reeve DO(PCP)- Patient was seen for general follow up. Chlorthalidone was decreased to 15 mg. Labs ordered. Follow up in 6 weeks.  Recent consult visits:  07/11/20 Cammy Brochure MD(Neurology)- Patient was seen for memory changes. Referral placed for formal neuropsych testing. MRI and Carotid ultrasound ordered.  06/06/20 [name] MD(Vascular)- Patient seen for peripheral artery disease. Follow up in 1 year.  Hospital visits:  None in previous 6 months  Medications: Outpatient Encounter Medications as of 11/18/2020  Medication Sig   amLODipine (NORVASC) 5 MG tablet Take 1 tablet (5 mg total) by mouth daily.   Ascorbic Acid (VITAMIN C) 1000 MG tablet Take 1,000 mg by mouth daily.   aspirin EC 81 MG tablet Take 1 tablet (81 mg total) by mouth daily.   chlorthalidone (HYGROTEN) 15 MG tablet Take 1 tablet (15 mg total) by mouth 2 (two) times a week. Tuesdays and Fridays once a day.   Coenzyme Q10 (COQ10) 100 MG CAPS Take 100 mg by mouth daily.    Cyanocobalamin (VITAMIN B-12 PO) Take 1 tablet by mouth every other day.   diazepam (VALIUM) 2 MG tablet Take 0.5-1 tablets (1-2 mg total) by mouth every 8 (eight) hours as needed (dizziness/severe anxiety). #30 for 90 days   donepezil (ARICEPT) 5 MG tablet Take 5 mg by mouth at bedtime.    escitalopram (LEXAPRO) 10 MG tablet Take 1 tablet (10 mg total) by mouth daily.   gabapentin (NEURONTIN) 100 MG capsule    levothyroxine (SYNTHROID) 50 MCG tablet Take 1 tablet (50 mcg total) by mouth daily.   losartan (COZAAR) 25 MG tablet Take 1 tablet (25 mg total) by mouth daily.   Multiple Vitamin (MULTIVITAMIN WITH MINERALS) TABS tablet Take 1 tablet by mouth daily.   oxycodone (OXY-IR) 5 MG capsule Take 1 capsule (5 mg total) by mouth every 4 (four) hours as needed (severe pain).   Red Yeast Rice 600 MG CAPS Take 600 mg by mouth daily.    timolol (TIMOPTIC) 0.25 % ophthalmic solution Place 1 drop into both eyes 2 (two) times daily.    No facility-administered encounter medications on file as of 11/18/2020.   amLODipine (NORVASC) 5 MG tablet last filled 08/17/20 90 DS Ascorbic Acid (VITAMIN C) 1000 MG tablet last filled aspirin EC 81 MG tablet last filled chlorthalidone (HYGROTEN) 15 MG tablet last filled 11/07/20 28 DS Coenzyme Q10 (COQ10) 100 MG CAPS last filled Cyanocobalamin (VITAMIN B-12 PO) last filled diazepam (VALIUM) 2 MG tablet last filled 05/12/19 30 DS donepezil (ARICEPT) 5 MG tablet last filled 11/07/20 30 DS escitalopram (LEXAPRO) 10 MG tablet last filled( 5 MG) 11/16/20 90 DS gabapentin (NEURONTIN) 100 MG capsule last filled 10/26/20 30 DS levothyroxine (SYNTHROID) 50 MCG tablet last filled 08/17/20 90 DS  losartan (COZAAR) 25 MG tablet last filled 08/30/20 90 DS Multiple Vitamin (MULTIVITAMIN WITH  MINERALS) TABS tablet last filled oxycodone (OXY-IR) 5 MG capsule last filled 05/11/20 5 DS Red Yeast Rice 600 MG CAPS last filled timolol (TIMOPTIC) 0.25 % ophthalmic solution last filled 11/16/20 25 DS    Star Rating Drugs:  Losartan (COZAAR) 25 MG tablet last filled 08/30/20 90 DS  [facility] Clinical Pharmacist Assistant [phone]

## 2020-11-22 ENCOUNTER — Telehealth: Payer: Self-pay | Admitting: Pharmacist

## 2020-11-22 ENCOUNTER — Telehealth: Payer: Medicare Other

## 2020-11-22 NOTE — Telephone Encounter (Signed)
Attempted to contact patient x2 and left voicemails, unsuccessful outcome for initial CCM visit with pharmacist via telephone.  Routing to schedule team for reschedule.  Laura Ellis

## 2020-11-22 NOTE — Telephone Encounter (Signed)
Patient has been removed from schedule for today. AM

## 2020-11-22 NOTE — Progress Notes (Deleted)
Current Barriers:  {PHARMCACYBARRIERS:21091514}  Pharmacist Clinical Goal(s):  Over the next *** days, patient will {PHARMACYGOALCHOICES:25079} through collaboration with PharmD and provider.   Interventions: 1:1 collaboration with Laura Reeve, DO regarding development and update of comprehensive plan of care as evidenced by provider attestation and co-signature Inter-disciplinary care team collaboration (see longitudinal plan of care) Comprehensive medication review performed; medication list updated in electronic medical record  Hypertension:  Uncontrolled/controlled; current treatment:***;   Current home readings:   Denies/reports hypotensive/hypertensive symptoms  {PHARMACYINTERVENTION:21091513},  Hyperlipidemia:  Uncontrolled/controlled; current treatment:***;   Medications previously tried: ***   Current dietary patterns: ***  {PHARMACYINTERVENTION:21091513} Osteoporosis:  Current treatment:***;   Last DEXA: ***, t score ***  Supplementation: ***  Patient Goals/Self-Care Activities Over the next *** days, patient will:  {PHARMACYPATIENTGOALS:25081}  Follow Up Plan: {CM FOLLOW UP YBWL:89373} ***     Chronic Care Management Pharmacy Note  11/22/2020 Name:  Laura Ellis MRN:  [id number] DOB:  1932-07-12  Summary:  Recommendations/Changes made from today's visit:  Plan:  Subjective: Laura Ellis is an 87 y.o. year old female who is a primary patient of Laura Reeve, DO.  The CCM team was consulted for assistance with disease management and care coordination needs.    {CCMTELEPHONEFACETOFACE:21091510} for {CCMINITIALFOLLOWUPCHOICE:21091511} in response to provider referral for pharmacy case management and/or care coordination services.   Consent to Services:  {CCMCONSENTOPTIONS:25074}  Patient Care Team: Laura Reeve, DO as PCP - General (Osteopathic Medicine) Darius Bump, [facility] as Pharmacist (Pharmacist)  Recent office visits:   11/01/20 Laura Reeve DO(PCP)- Patient seen for general follow up.Will get Prolia arranged . Patient was referred to Gastroenterology and Bone density tests were ordered. Follow up in 6 months.   09/28/20 Laura Reeve DO(PCP)- Patient was seen for follow up. Referral placed to behavioral health. Lexapro was increased to 10 mg daily. Follow up in 4 weeks.   08/17/20 Laura Reeve DO(PCP)- Patient was seen for general follow up. Chlorthalidone was decreased to 15 mg. Labs ordered. Follow up in 6 weeks.   Recent consult visits:  07/11/20 Cammy Brochure MD(Neurology)- Patient was seen for memory changes. Referral placed for formal neuropsych testing. MRI and Carotid ultrasound ordered.   06/06/20 [name] MD(Vascular)- Patient seen for peripheral artery disease. Follow up in 1 year.   Hospital visits:  None in previous 6 months  Objective:  Lab Results  Component Value Date   CREATININE 1.84 (H) 08/17/2020   CREATININE 1.5 (A) 04/21/2020   CREATININE 1.69 (H) 10/19/2019    No results found for: HGBA1C Last diabetic Eye exam: No results found for: HMDIABEYEEXA  Last diabetic Foot exam: No results found for: HMDIABFOOTEX      Component Value Date/Time   CHOL 209 (H) 10/19/2019 1450   TRIG 135 10/19/2019 1450   HDL 71 10/19/2019 1450   CHOLHDL 2.9 10/19/2019 1450   VLDL 31 (H) 02/23/2016 1516   LDLCALC 113 (H) 10/19/2019 1450    Hepatic Function Latest Ref Rng & Units 08/17/2020 10/19/2019 11/26/2018  Total Protein 6.1 - 8.1 g/dL 7.0 7.2 6.8  Albumin 3.6 - 5.1 g/dL - - -  AST 10 - 35 U/L 22 23 34  ALT 6 - 29 U/L $Remo'12 13 16  'SegmK$ Alk Phosphatase 25 - 125 - - -  Total Bilirubin 0.2 - 1.2 mg/dL 0.4 0.4 0.5    Lab Results  Component Value Date/Time   TSH 3.43 08/17/2020 01:43 PM   TSH 9.03 (H) 10/19/2019 02:50 PM   FREET4 1.4  11/26/2018 03:23 PM   FREET4 1.3 03/24/2018 02:31 PM    CBC Latest Ref Rng & Units 08/17/2020 04/21/2020 10/19/2019  WBC 3.8 - 10.8 Thousand/uL 6.6 6.8 6.6   Hemoglobin 11.7 - 15.5 g/dL 12.4 12.3 12.3  Hematocrit 35.0 - 45.0 % 37.6 38 38.2  Platelets 140 - 400 Thousand/uL 293 284 291    Lab Results  Component Value Date/Time   VD25OH 42 10/16/2016 02:06 PM   VD25OH 38 02/23/2016 03:16 PM    Clinical ASCVD: {YES/NO:21197} The ASCVD Risk score Mikey Bussing DC Jr., et al., 2013) failed to calculate for the following reasons:   The 2013 ASCVD risk score is only valid for ages 22 to 70    Other: (CHADS2VASc if Afib, PHQ9 if depression, MMRC or CAT for COPD, ACT, DEXA)  Social History   Tobacco Use  Smoking Status Former   Years: 40.00   Pack years: 0.00   Types: Cigarettes  Smokeless Tobacco Never  Tobacco Comments   quit 2003   BP Readings from Last 3 Encounters:  11/01/20 137/81  09/28/20 139/62  08/17/20 (!) 150/60   Pulse Readings from Last 3 Encounters:  11/01/20 63  09/28/20 74  08/17/20 79   Wt Readings from Last 3 Encounters:  11/01/20 138 lb 0.6 oz (62.6 kg)  09/28/20 138 lb 1.9 oz (62.7 kg)  08/17/20 140 lb 1.9 oz (63.6 kg)    Assessment: Review of patient past medical history, allergies, medications, health status, including review of consultants reports, laboratory and other test data, was performed as part of comprehensive evaluation and provision of chronic care management services.   SDOH:  (Social Determinants of Health) assessments and interventions performed:    CCM Care Plan  Allergies  Allergen Reactions   Alendronate Sodium     Esophagitis    Ace Inhibitors     Cough   Ezetimibe Other (See Comments)    Unknown rxn   Fenofibrate     Arthralgia    Rosuvastatin Calcium     Muscle aches   Statins Other (See Comments)    Muscle aches    Medications Reviewed Today     Reviewed by Mertha Finders, CMA (Certified Medical Assistant) on 09/28/20 at 1343  Med List Status: <None>   Medication Order Taking? Sig Documenting Provider Last Dose Status Informant  amLODipine (NORVASC) 5 MG tablet  [id number] Yes Take 1 tablet (5 mg total) by mouth daily. Laura Reeve, DO Taking Active   Ascorbic Acid (VITAMIN C) 1000 MG tablet [id number] Yes Take 1,000 mg by mouth daily. [provider] Taking Active Family Member  aspirin EC 81 MG tablet [id number] Yes Take 1 tablet (81 mg total) by mouth daily. Dagoberto Ligas, PA-C Taking Active   chlorthalidone (HYGROTEN) 15 MG tablet [id number] Yes Take 1 tablet (15 mg total) by mouth 2 (two) times a week. Tuesdays and Fridays once a day. Laura Reeve, DO Taking Active   Coenzyme Q10 (COQ10) 100 MG CAPS [id number] Yes Take 100 mg by mouth daily.  [provider] Taking Active Family Member  Cyanocobalamin (VITAMIN B-12 PO) [id number] Yes Take 1 tablet by mouth every other day. [provider] Taking Active Family Member  diazepam (VALIUM) 2 MG tablet [id number] Yes Take 0.5-1 tablets (1-2 mg total) by mouth every 8 (eight) hours as needed (dizziness/severe anxiety). #30 for 90 days Laura Reeve, DO Taking Active   escitalopram (LEXAPRO) 5 MG tablet [id number] Yes Take 1 tablet (5 mg total) by mouth at bedtime.  Laura Reeve, DO Taking Active   gabapentin (NEURONTIN) 100 MG capsule [id number] Yes  [provider] Taking Active   levothyroxine (SYNTHROID) 50 MCG tablet [id number] Yes Take 1 tablet (50 mcg total) by mouth daily. Laura Reeve, DO Taking Active   losartan (COZAAR) 25 MG tablet [id number] Yes Take 1 tablet (25 mg total) by mouth daily. Laura Reeve, DO Taking Active   Multiple Vitamin (MULTIVITAMIN WITH MINERALS) TABS tablet [id number] Yes Take 1 tablet by mouth daily. [provider] Taking Active Family Member  oxycodone (OXY-IR) 5 MG capsule [id number] Yes Take 1 capsule (5 mg total) by mouth every 4 (four) hours as needed (severe pain). Laura Reeve, DO Taking Active   Red Yeast Rice 600 MG CAPS [id number] Yes Take 600 mg by mouth daily.  [provider] Taking Active  Family Member  timolol (TIMOPTIC) 0.25 % ophthalmic solution [id number] Yes Place 1 drop into both eyes 2 (two) times daily.  [provider] Taking Active Family Member  Med List Note Isidor Holts, [facility] 12/24/18 1157): Daughter Clarise Cruz requests we speak to her or her sister Sharyn Lull rather than the patient due to dementia.            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   Schatzki's ring of distal esophagus 11/26/2018   Hiatal hernia 11/26/2018   Spinal stenosis of lumbar region at multiple levels 03/13/2018   Advance directive discussed with patient 01/16/2018   Memory loss 01/16/2018   Chronic venous insufficiency 09/26/2017   Elevated blood pressure reading with diagnosis of hypertension 09/26/2017   Ulcer of left lower extremity with fat layer exposed ([place]) 09/26/2017   Renal cyst, left 07/18/2017   Chronic pain syndrome 05/25/2017   Degenerative scoliosis 05/15/2017   Degenerative spondylolisthesis 05/15/2017   Neuropathic pain 05/15/2017   Orthostatic dizziness 03/07/2017   CKD (chronic kidney disease) stage 4, GFR 15-29 ml/min (HCC) 02/23/2016   Trochanteric bursitis of left hip 01/17/2016   Left ankle pain 01/17/2016   Hypothyroidism 07/05/2015   Osteoporosis 05/26/2015   Cataract 05/26/2015   Bilateral knee pain 05/26/2015   Chronic low back pain 05/26/2015   Essential hypertension 05/26/2015   Allergic rhinitis due to pollen 02/14/2015   Gastroesophageal reflux disease without esophagitis 02/14/2015   History of esophageal stricture 02/14/2015   Carotid bruit 08/28/2011   Hyperlipidemia, unspecified 01/22/2011    Immunization History  Administered Date(s) Administered   Fluad Quad(high Dose 65+) 01/20/2019   Influenza Split 03/02/2009, 02/06/2010, 01/31/2011   Influenza,  High Dose Seasonal PF 03/07/2017, 03/24/2018   Influenza, Seasonal, Injecte, Preservative Fre 02/10/2014   Influenza,inj,Quad PF,6+ Mos 06/02/2015, 02/23/2016   Influenza,inj,quad, With Preservative 05/14/2016   Influenza-Unspecified 02/13/2012, 03/08/2020   PFIZER(Purple Top)SARS-COV-2 Vaccination 07/12/2019, 08/11/2019, 03/08/2020   Pneumococcal Conjugate-13 03/24/2014   Pneumococcal Polysaccharide-23 02/13/2012   Td 04/26/2009   Tdap 11/19/2013   Zoster, Live 03/25/2009    Conditions to be addressed/monitored: {CCM ASSESSMENT DISEASE OPTIONS:25047}  There are no care plans that you recently modified to display for this patient.   Medication Assistance: {MEDASSISTANCEINFO:25044}  Patient's preferred pharmacy is:  [facility], [place] - [address] [place] [address] Phone: [phone] Fax: [place], [place] - [address]  [place] [address] Phone: [phone] Fax: [phone]  Uses pill box? {Yes or If no, why not?:20788} Pt endorses ***% compliance  Follow Up:  {FOLLOWUP:24991}  Plan: {CM FOLLOW UP PLAN:25073}  SIG***

## 2020-11-25 LAB — COMPLETE METABOLIC PANEL WITH GFR
AG Ratio: 1.6 (calc) (ref 1.0–2.5)
ALT: 13 U/L (ref 6–29)
AST: 24 U/L (ref 10–35)
Albumin: 4 g/dL (ref 3.6–5.1)
Alkaline phosphatase (APISO): 67 U/L (ref 37–153)
BUN/Creatinine Ratio: 19 (calc) (ref 6–22)
BUN: 33 mg/dL — ABNORMAL HIGH (ref 7–25)
CO2: 26 mmol/L (ref 20–32)
Calcium: 9.5 mg/dL (ref 8.6–10.4)
Chloride: 105 mmol/L (ref 98–110)
Creat: 1.75 mg/dL — ABNORMAL HIGH (ref 0.60–0.95)
Globulin: 2.5 g/dL (calc) (ref 1.9–3.7)
Glucose, Bld: 79 mg/dL (ref 65–99)
Potassium: 4.9 mmol/L (ref 3.5–5.3)
Sodium: 139 mmol/L (ref 135–146)
Total Bilirubin: 0.4 mg/dL (ref 0.2–1.2)
Total Protein: 6.5 g/dL (ref 6.1–8.1)
eGFR: 28 mL/min/{1.73_m2} — ABNORMAL LOW (ref >=60)

## 2020-11-30 ENCOUNTER — Ambulatory Visit: Payer: Medicare Other

## 2020-12-13 ENCOUNTER — Other Ambulatory Visit: Payer: Self-pay | Admitting: Osteopathic Medicine

## 2020-12-31 ENCOUNTER — Other Ambulatory Visit: Payer: Self-pay | Admitting: Osteopathic Medicine

## 2021-01-02 ENCOUNTER — Other Ambulatory Visit: Payer: Self-pay

## 2021-01-02 MED ORDER — ESCITALOPRAM OXALATE 10 MG PO TABS
10.0000 mg | ORAL_TABLET | Freq: Every day | ORAL | 3 refills | Status: DC
Start: 2021-01-02 — End: 2021-03-29

## 2021-02-15 ENCOUNTER — Ambulatory Visit: Payer: Medicare Other | Admitting: Family Medicine

## 2021-02-15 ENCOUNTER — Encounter: Payer: Self-pay | Admitting: Family Medicine

## 2021-02-28 ENCOUNTER — Other Ambulatory Visit: Payer: Self-pay | Admitting: Family Medicine

## 2021-02-28 ENCOUNTER — Other Ambulatory Visit: Payer: Self-pay | Admitting: Osteopathic Medicine

## 2021-02-28 MED ORDER — CHLORTHALIDONE 15 MG PO TABS
15.0000 mg | ORAL_TABLET | ORAL | 1 refills | Status: DC
Start: 2021-03-02 — End: 2022-02-15

## 2021-02-28 NOTE — Telephone Encounter (Signed)
Spoke with pt who states that she needs refills on donepezil, gabapentin and chlorthalidone. We have not prescribed the gabapentin and donepezil for the patient before.  Please authorize if appropriate.  Charyl Bigger, CMA

## 2021-02-28 NOTE — Telephone Encounter (Signed)
See Dr. Rodena Piety note.  2 of the meds are by her neuro, not [place]

## 2021-02-28 NOTE — Telephone Encounter (Signed)
LVM for pt to return call for clarification of which medications she needs refills on.  Charyl Bigger, CMA

## 2021-02-28 NOTE — Telephone Encounter (Signed)
Pt called. She is needing refills on her Gabapentin, Donetezil Hcl and Thalitonea. She has scheduled an appointment with Dr. Zigmund Daniel for November 16th.  Thank you.

## 2021-03-01 NOTE — Telephone Encounter (Signed)
Attempted to contact pt.  Line was busy.  Charyl Bigger, CMA

## 2021-03-01 NOTE — Telephone Encounter (Signed)
Attempted to contact.  Phone was answered but no one seemed to be on line and did not respond when I would say "hello".  Charyl Bigger, CMA

## 2021-03-02 NOTE — Telephone Encounter (Signed)
Refills for these medications need to come from neuro per Dr. Zigmund Daniel.

## 2021-03-02 NOTE — Telephone Encounter (Signed)
LVM for pt to call to discuss.  T. Keely Drennan, CMA  

## 2021-03-29 ENCOUNTER — Ambulatory Visit (INDEPENDENT_AMBULATORY_CARE_PROVIDER_SITE_OTHER): Payer: Medicare Other | Admitting: Family Medicine

## 2021-03-29 ENCOUNTER — Other Ambulatory Visit: Payer: Self-pay

## 2021-03-29 ENCOUNTER — Encounter: Payer: Self-pay | Admitting: Family Medicine

## 2021-03-29 VITALS — BP 141/75 | HR 67 | Temp 97.7°F | Ht 61.0 in | Wt 136.0 lb

## 2021-03-29 DIAGNOSIS — M25511 Pain in right shoulder: Secondary | ICD-10-CM | POA: Diagnosis not present

## 2021-03-29 DIAGNOSIS — Z23 Encounter for immunization: Secondary | ICD-10-CM | POA: Diagnosis not present

## 2021-03-29 DIAGNOSIS — R413 Other amnesia: Secondary | ICD-10-CM

## 2021-03-29 DIAGNOSIS — R4701 Aphasia: Secondary | ICD-10-CM | POA: Diagnosis not present

## 2021-03-29 DIAGNOSIS — M810 Age-related osteoporosis without current pathological fracture: Secondary | ICD-10-CM | POA: Diagnosis not present

## 2021-03-29 DIAGNOSIS — F02B4 Dementia in other diseases classified elsewhere, moderate, with anxiety: Secondary | ICD-10-CM

## 2021-03-29 DIAGNOSIS — M4310 Spondylolisthesis, site unspecified: Secondary | ICD-10-CM

## 2021-03-29 DIAGNOSIS — E039 Hypothyroidism, unspecified: Secondary | ICD-10-CM

## 2021-03-29 DIAGNOSIS — G301 Alzheimer's disease with late onset: Secondary | ICD-10-CM

## 2021-03-29 DIAGNOSIS — Z1231 Encounter for screening mammogram for malignant neoplasm of breast: Secondary | ICD-10-CM

## 2021-03-29 MED ORDER — ESCITALOPRAM OXALATE 20 MG PO TABS: 20.0000 mg | ORAL_TABLET | Freq: Every day | ORAL | 1 refills | Status: DC

## 2021-03-29 MED ORDER — GABAPENTIN 100 MG PO CAPS: 200.0000 mg | ORAL_CAPSULE | Freq: Two times a day (BID) | ORAL | 3 refills | Status: DC

## 2021-03-29 NOTE — Patient Instructions (Signed)
Nice to meet you today! Try increasing gabapentin to 200mg  twice per day.  Increase lexapro to 20mg  daily.  See me again in 3 months.

## 2021-04-02 ENCOUNTER — Encounter: Payer: Self-pay | Admitting: Family Medicine

## 2021-04-02 DIAGNOSIS — M25511 Pain in right shoulder: Secondary | ICD-10-CM | POA: Insufficient documentation

## 2021-04-02 DIAGNOSIS — F0390 Unspecified dementia without behavioral disturbance: Secondary | ICD-10-CM | POA: Insufficient documentation

## 2021-04-02 DIAGNOSIS — R4701 Aphasia: Secondary | ICD-10-CM | POA: Insufficient documentation

## 2021-04-02 NOTE — Assessment & Plan Note (Signed)
Stable with current dose of levothyroxine.  Continue.

## 2021-04-02 NOTE — Assessment & Plan Note (Addendum)
She has degenerative spondylolisthesis with associated neuropathy.  Neuropathic pain is not well controlled.  We will increase gabapentin to 200 mg up to 3 times per day.

## 2021-04-02 NOTE — Progress Notes (Signed)
Laura Ellis - 87 y.o. female MRN [id number]  Date of birth: 05/08/1933  Subjective Chief Complaint  Patient presents with   Follow-up    HPI Laura Ellis is an 87 year old female here today for follow-up visit.  She has history of hypertension, peripheral arterial disease, hypothyroidism, CKD and dementia.  She is accompanied today by her daughter.  Daughter has noticed she has had increased difficulty with aphasia and some difficulty with swallowing.  They would like referral for home health SLP.  She continues to have difficulty with anxiety.  Currently on Lexapro 10 mg daily.  Her daughter is requesting possible increase in this.  Pain often worsens her anxiety.  This is currently managed with combination of gabapentin and Tylenol.  She did have a fall previously and has had some pain and weakness in her right shoulder.  She is able to move the arm but has some decreased range of motion.  She denies numbness or tingling into the arms.  ROS:  A comprehensive ROS was completed and negative except as noted per HPI   Allergies  Allergen Reactions   Alendronate Sodium     Esophagitis    Ace Inhibitors     Cough   Ezetimibe Other (See Comments)    Unknown rxn   Fenofibrate     Arthralgia    Rosuvastatin Calcium     Muscle aches   Statins Other (See Comments)    Muscle aches    Past Medical History:  Diagnosis Date   Arthritis    Chronic low back pain 05/26/2015   CKD (chronic kidney disease) stage 4, GFR 15-29 ml/min (HCC) 02/23/2016   Essential hypertension 05/26/2015   Glaucoma    Hypertension    Hypothyroidism 07/05/2015   Osteopenia    Osteoporosis 05/26/2015   old Dexa reviewed, pt states doesn't want to be on the bisphosphonate therapy but is taking Ca and D    Spinal stenosis of lumbar region at multiple levels 03/13/2018   MRI [facility]opedics January 2019    Past Surgical History:  Procedure Laterality Date   ABDOMINAL AORTOGRAM W/LOWER EXTREMITY Left  12/29/2018   Procedure: ABDOMINAL AORTOGRAM W/LOWER EXTREMITY;  Surgeon: Waynetta Sandy, MD;  Location: [facility];  Service: Cardiovascular;  Laterality: Left;   ABDOMINAL HYSTERECTOMY     COLONOSCOPY  2015   [facility]   ENDOVENOUS ABLATION SAPHENOUS VEIN W/ LASER Right 12/27/2016   endovenous laser ablation right greater saphenous vein by [name] MD   ESOPHAGOGASTRODUODENOSCOPY  2015   x2 or 3 times in [place]   ESOPHAGOGASTRODUODENOSCOPY (EGD) WITH ESOPHAGEAL DILATION N/A    KNEE ARTHROSCOPY     PERIPHERAL VASCULAR ATHERECTOMY  12/29/2018   Procedure: PERIPHERAL VASCULAR ATHERECTOMY;  Surgeon: Waynetta Sandy, MD;  Location: [facility];  Service: Cardiovascular;;  left SFA   PERIPHERAL VASCULAR BALLOON ANGIOPLASTY  12/29/2018   Procedure: PERIPHERAL VASCULAR BALLOON ANGIOPLASTY;  Surgeon: Waynetta Sandy, MD;  Location: [facility];  Service: Cardiovascular;;  Left SFA   VARICOSE VEIN SURGERY      Social History   Socioeconomic History   Marital status: Widowed    Spouse name: Not on file   Number of children: 3   Years of education: 14   Highest education level: Some college, no degree  Occupational History   Occupation: Engineer, site    Comment: retired  Tobacco Use   Smoking status: Former    Years: 40.00  Types: Cigarettes   Smokeless tobacco: Never   Tobacco comments:    quit 2003  Vaping Use   Vaping Use: Never used  Substance and Sexual Activity   Alcohol use: Yes    Alcohol/week: 1.0 standard drink    Types: 1 Glasses of wine per week    Comment: occasionally-rare   Drug use: No   Sexual activity: Not Currently  Other Topics Concern   Not on file  Social History Narrative   Feeds all her animals- cats- rescue   Chores around the house   Watches TV   Coffee 2 cups in the am   Social Determinants of Health   Financial Resource Strain: Low Risk    Difficulty of  Paying Living Expenses: Not hard at all  Food Insecurity: No Food Insecurity   Worried About Charity fundraiser in the Last Year: Never true   Ran Out of Food in the Last Year: Never true  Transportation Needs: No Transportation Needs   Lack of Transportation (Medical): No   Lack of Transportation (Non-Medical): No  Physical Activity: Inactive   Days of Exercise per Week: 0 days   Minutes of Exercise per Session: 0 min  Stress: No Stress Concern Present   Feeling of Stress : Not at all  Social Connections: Socially Isolated   Frequency of Communication with Friends and Family: More than three times a week   Frequency of Social Gatherings with Friends and Family: More than three times a week   Attends Religious Services: Never   Marine scientist or Organizations: No   Attends Archivist Meetings: Never   Marital Status: Widowed    Family History  Problem Relation Age of Onset   Stroke Mother    Lung cancer Father    Heart disease Sister    Hypertension Sister    Hypertension Daughter    Rheum arthritis Daughter    Fibromyalgia Daughter    Colon cancer Neg Hx    Esophageal cancer Neg Hx     Health Maintenance  Topic Date Due   Zoster Vaccines- Shingrix (1 of 2) Never done   COVID-19 Vaccine (4 - Booster for Pfizer series) 05/03/2020   TETANUS/TDAP  11/20/2023   Pneumonia Vaccine 39+ Years old  Completed   INFLUENZA VACCINE  Completed   DEXA SCAN  Completed   HPV VACCINES  Aged Out     ----------------------------------------------------------------------------------------------------------------------------------------------------------------------------------------------------------------- Physical Exam BP (!) 141/75 (BP Location: Right Arm, Patient Position: Sitting, Cuff Size: Normal)   Pulse 67   Temp 97.7 F (36.5 C) (Oral)   Ht 5\' 1"  (1.549 m)   Wt 136 lb (61.7 kg)   SpO2 97%   BMI 25.70 kg/m   Physical Exam Constitutional:       Appearance: Normal appearance.  HENT:     Head: Normocephalic and atraumatic.  Eyes:     General: No scleral icterus. Cardiovascular:     Rate and Rhythm: Normal rate and regular rhythm.  Pulmonary:     Effort: Pulmonary effort is normal.     Breath sounds: Normal breath sounds.  Musculoskeletal:     Cervical back: Neck supple.     Comments: Right shoulder normal to inspection and palpation.  No bony tenderness noted.  She does have mild weakness with abduction and flexion of the arm.  Negative drop arm sign.  Decreased pain with passive range of motion.  Positive empty can.  Neurological:     General: No focal  deficit present.     Mental Status: She is alert.  Psychiatric:        Mood and Affect: Mood normal.        Behavior: Behavior normal.   Procedure note: Procedure reviewed with patient and his daughter.  Potential complications discussed.  They would like to proceed with procedure.  She was prepped in typical sterile fashion with chlorhexidine and alcohol.  Subacromial space was injected with 1 mL of 40 mg/mL of Depo-Medrol and 4 mL of 1% lidocaine using posterior approach.  She tolerated this procedure well.  Band-Aid applied afterwards.  Post procedure instructions given. ------------------------------------------------------------------------------------------------------------------------------------------------------------------------------------------------------------------- Assessment and Plan  Aphasia Mild aphasia related to dementia.  Referral placed to home health for speech and language pathology evaluation.  Hypothyroidism Stable with current dose of levothyroxine.  Continue.  Degenerative spondylolisthesis She has degenerative spondylolisthesis with associated neuropathy.  Neuropathic pain is not well controlled.  We will increase gabapentin to 200 mg up to 3 times per day.  Dementia ([place]) She has dementia with associated anxiety.  Increasing Lexapro to 20 mg  daily.  She will continue Aricept at current strength.  Right shoulder pain No bony tenderness.  I will think we need x-rays at this time.  She may have rotator cuff tear however due to her fairly advanced dementia I do not think she would be a good surgical candidate.  We did discuss injection today which her daughter agree with trying.  Injection given.  She tolerated well.  See procedure note.   Meds ordered this encounter  Medications   gabapentin (NEURONTIN) 100 MG capsule    Sig: Take 2 capsules (200 mg total) by mouth 2 (two) times daily.    Dispense:  120 capsule    Refill:  3   escitalopram (LEXAPRO) 20 MG tablet    Sig: Take 1 tablet (20 mg total) by mouth daily.    Dispense:  90 tablet    Refill:  1    Return in about 3 months (around 06/29/2021) for HTN.    This visit occurred during the SARS-CoV-2 public health emergency.  Safety protocols were in place, including screening questions prior to the visit, additional usage of staff PPE, and extensive cleaning of exam room while observing appropriate contact time as indicated for disinfecting solutions.

## 2021-04-02 NOTE — Assessment & Plan Note (Signed)
She has dementia with associated anxiety.  Increasing Lexapro to 20 mg daily.  She will continue Aricept at current strength.

## 2021-04-02 NOTE — Assessment & Plan Note (Signed)
No bony tenderness.  I will think we need x-rays at this time.  She may have rotator cuff tear however due to her fairly advanced dementia I do not think she would be a good surgical candidate.  We did discuss injection today which her daughter agree with trying.  Injection given.  She tolerated well.  See procedure note.

## 2021-04-02 NOTE — Assessment & Plan Note (Signed)
Mild aphasia related to dementia.  Referral placed to home health for speech and language pathology evaluation.

## 2021-04-03 ENCOUNTER — Other Ambulatory Visit: Payer: Self-pay | Admitting: Osteopathic Medicine

## 2021-04-19 ENCOUNTER — Ambulatory Visit: Payer: Medicare Other

## 2021-04-19 ENCOUNTER — Other Ambulatory Visit: Payer: Medicare Other

## 2021-05-03 ENCOUNTER — Ambulatory Visit: Payer: Medicare Other | Admitting: Osteopathic Medicine

## 2021-06-06 ENCOUNTER — Other Ambulatory Visit: Payer: Self-pay | Admitting: Osteopathic Medicine

## 2021-06-29 ENCOUNTER — Telehealth: Payer: Self-pay

## 2021-06-29 ENCOUNTER — Encounter: Payer: Self-pay | Admitting: Family Medicine

## 2021-06-29 ENCOUNTER — Other Ambulatory Visit: Payer: Self-pay

## 2021-06-29 ENCOUNTER — Ambulatory Visit (INDEPENDENT_AMBULATORY_CARE_PROVIDER_SITE_OTHER): Payer: Medicare Other | Admitting: Family Medicine

## 2021-06-29 DIAGNOSIS — R4701 Aphasia: Secondary | ICD-10-CM

## 2021-06-29 DIAGNOSIS — I10 Essential (primary) hypertension: Secondary | ICD-10-CM

## 2021-06-29 DIAGNOSIS — G301 Alzheimer's disease with late onset: Secondary | ICD-10-CM

## 2021-06-29 DIAGNOSIS — F02B4 Dementia in other diseases classified elsewhere, moderate, with anxiety: Secondary | ICD-10-CM | POA: Diagnosis not present

## 2021-06-29 NOTE — Assessment & Plan Note (Signed)
Followed by neurology, currently on donepezil.

## 2021-06-29 NOTE — Assessment & Plan Note (Signed)
Blood pressure well controlled on recheck at the end of her appointment.  She will continue current medications for management of hypertension.

## 2021-06-29 NOTE — Assessment & Plan Note (Signed)
New referral placed to speech therapy.

## 2021-06-29 NOTE — Telephone Encounter (Signed)
Created separate speech therapy referral due to [facility] insurance limitations.

## 2021-06-29 NOTE — Progress Notes (Signed)
Laura Ellis - 88 y.o. female MRN [id number]  Date of birth: [birth year]  Subjective Chief Complaint  Patient presents with   Hypertension    HPI Laura Ellis is an 88 year old female here today for follow-up of hypertension.  She does have history of Alzheimer's dementia as well as hypothyroidism.  She has had some issues with speech and aphasia.  Referral was placed to home health speech therapy however they were never contacted for this.  Her family still wants her to work with speech therapy.  She continues to have some progressive memory loss.  She does continue to manage her finances however her family is trying to convince her to let them help with this.  Her daughter does have power of attorney.  She does continue to see neurology as well.  Blood pressure is elevated today upon arrival.  She is taking medication as directed.  She denies side effects related to medication.  She has not had chest pain, shortness of breath, palpitations, headaches or vision changes.  ROS:  A comprehensive ROS was completed and negative except as noted per HPI  Allergies  Allergen Reactions   Alendronate Sodium     Esophagitis    Ace Inhibitors     Cough   Ezetimibe Other (See Comments)    Unknown rxn   Fenofibrate     Arthralgia    Rosuvastatin Calcium     Muscle aches   Statins Other (See Comments)    Muscle aches    Past Medical History:  Diagnosis Date   Arthritis    Chronic low back pain 05/26/2015   CKD (chronic kidney disease) stage 4, GFR 15-29 ml/min (HCC) 02/23/2016   Essential hypertension 05/26/2015   Glaucoma    Hypertension    Hypothyroidism 07/05/2015   Osteopenia    Osteoporosis 05/26/2015   old Dexa reviewed, pt states doesn't want to be on the bisphosphonate therapy but is taking Ca and D    Spinal stenosis of lumbar region at multiple levels 03/13/2018   MRI [facility] orthopedics January 2019    Past Surgical History:  Procedure Laterality Date   ABDOMINAL AORTOGRAM  W/LOWER EXTREMITY Left 12/29/2018   Procedure: ABDOMINAL AORTOGRAM W/LOWER EXTREMITY;  Surgeon: Waynetta Sandy, MD;  Location: [place] [facility];  Service: Cardiovascular;  Laterality: Left;   ABDOMINAL HYSTERECTOMY     COLONOSCOPY  2015   [facility]   ENDOVENOUS ABLATION SAPHENOUS VEIN W/ LASER Right 12/27/2016   endovenous laser ablation right greater saphenous vein by [name] MD   ESOPHAGOGASTRODUODENOSCOPY  2015   x2 or 3 times in [place]   ESOPHAGOGASTRODUODENOSCOPY (EGD) WITH ESOPHAGEAL DILATION N/A    KNEE ARTHROSCOPY     PERIPHERAL VASCULAR ATHERECTOMY  12/29/2018   Procedure: PERIPHERAL VASCULAR ATHERECTOMY;  Surgeon: Waynetta Sandy, MD;  Location: [place] [facility];  Service: Cardiovascular;;  left SFA   PERIPHERAL VASCULAR BALLOON ANGIOPLASTY  12/29/2018   Procedure: PERIPHERAL VASCULAR BALLOON ANGIOPLASTY;  Surgeon: Waynetta Sandy, MD;  Location: [place] [facility];  Service: Cardiovascular;;  Left SFA   VARICOSE VEIN SURGERY      Social History   Socioeconomic History   Marital status: Widowed    Spouse name: Not on file   Number of children: 3   Years of education: 14   Highest education level: Some college, no degree  Occupational History   Occupation: Engineer, site    Comment: retired  Tobacco Use   Smoking status: Former  Years: 40.00    Types: Cigarettes   Smokeless tobacco: Never   Tobacco comments:    quit 2003  Vaping Use   Vaping Use: Never used  Substance and Sexual Activity   Alcohol use: Yes    Alcohol/week: 1.0 standard drink    Types: 1 Glasses of wine per week    Comment: occasionally-rare   Drug use: No   Sexual activity: Not Currently  Other Topics Concern   Not on file  Social History Narrative   Feeds all her animals- cats- rescue   Chores around the house   Watches TV   Coffee 2 cups in the am   Social Determinants of Health   Financial Resource Strain: Not on  file  Food Insecurity: Not on file  Transportation Needs: Not on file  Physical Activity: Not on file  Stress: Not on file  Social Connections: Not on file    Family History  Problem Relation Age of Onset   Stroke Mother    Lung cancer Father    Heart disease Sister    Hypertension Sister    Hypertension Daughter    Rheum arthritis Daughter    Fibromyalgia Daughter    Colon cancer Neg Hx    Esophageal cancer Neg Hx     Health Maintenance  Topic Date Due   Zoster Vaccines- Shingrix (1 of 2) Never done   COVID-19 Vaccine (4 - Booster for Pfizer series) 05/03/2020   TETANUS/TDAP  11/20/2023   Pneumonia Vaccine 67+ Years old  Completed   INFLUENZA VACCINE  Completed   DEXA SCAN  Completed   HPV VACCINES  Aged Out     ----------------------------------------------------------------------------------------------------------------------------------------------------------------------------------------------------------------- Physical Exam BP 127/76 (BP Location: Left Arm, Patient Position: Sitting, Cuff Size: Large)    Pulse 68    Ht 5\' 1"  (1.549 m)    Wt 133 lb (60.3 kg)    SpO2 97%    BMI 25.13 kg/m   Physical Exam Eyes:     General: No scleral icterus. Cardiovascular:     Rate and Rhythm: Normal rate and regular rhythm.  Pulmonary:     Effort: Pulmonary effort is normal.     Breath sounds: Normal breath sounds.  Musculoskeletal:     Cervical back: Neck supple.  Neurological:     General: No focal deficit present.     Mental Status: She is alert.  Psychiatric:        Mood and Affect: Mood normal.        Behavior: Behavior normal.    ------------------------------------------------------------------------------------------------------------------------------------------------------------------------------------------------------------------- Assessment and Plan  Aphasia New referral placed to speech therapy.  Dementia ([place]) Followed by neurology, currently on  donepezil.  Essential hypertension Blood pressure well controlled on recheck at the end of her appointment.  She will continue current medications for management of hypertension.   No orders of the defined types were placed in this encounter.   Return in about 6 months (around 12/27/2021) for HTN.    This visit occurred during the SARS-CoV-2 public health emergency.  Safety protocols were in place, including screening questions prior to the visit, additional usage of staff PPE, and extensive cleaning of exam room while observing appropriate contact time as indicated for disinfecting solutions.

## 2021-08-24 ENCOUNTER — Other Ambulatory Visit: Payer: Self-pay | Admitting: Osteopathic Medicine

## 2021-09-27 ENCOUNTER — Other Ambulatory Visit: Payer: Self-pay | Admitting: *Deleted

## 2021-09-27 DIAGNOSIS — I739 Peripheral vascular disease, unspecified: Secondary | ICD-10-CM

## 2021-10-02 ENCOUNTER — Other Ambulatory Visit: Payer: Self-pay | Admitting: Family Medicine

## 2021-10-04 ENCOUNTER — Ambulatory Visit: Payer: Medicare Other | Admitting: Vascular Surgery

## 2021-10-04 ENCOUNTER — Ambulatory Visit (INDEPENDENT_AMBULATORY_CARE_PROVIDER_SITE_OTHER): Payer: Medicare Other

## 2021-10-04 ENCOUNTER — Encounter: Payer: Self-pay | Admitting: Vascular Surgery

## 2021-10-04 VITALS — BP 158/74 | HR 61 | Temp 97.7°F | Resp 14 | Ht 66.0 in | Wt 133.4 lb

## 2021-10-04 DIAGNOSIS — I739 Peripheral vascular disease, unspecified: Secondary | ICD-10-CM | POA: Diagnosis not present

## 2021-10-04 NOTE — Progress Notes (Signed)
[facility]  Patient name: Laura Ellis MRN: [id number] DOB: [name] 19, 1933 Sex: female  REASON FOR VISIT: Follow-up of arterial and venous lower extremity disease  HPI: Laura Ellis is a 88 y.o. female is here today with her daughter.  She continues to have some memory issues and her daughter helps with her history.  She was concerned regarding some discomfort over her left lateral pretibial area where she had had difficulty with healing venous ulcer in the past.  She had undergone treatment for correction of her superficial venous hypertension in the office in 2019.  She continues to have difficulty healing her venous ulcer.  She did have known mixed arterial difficulty as well.  Her ankle arm index dropped into the 0.25 range and she underwent treatment with left distal superficial femoral atherectomy and drug-eluting balloon angioplasty with Dr.Cain in August 2020.  She reports having some difficulty with discomfort in the area of her old ulcer and was concerned that she may be having worsening problems and presents for reevaluation.  Past Medical History:  Diagnosis Date   Arthritis    Chronic low back pain 05/26/2015   CKD (chronic kidney disease) stage 4, GFR 15-29 ml/min (HCC) 02/23/2016   Essential hypertension 05/26/2015   Glaucoma    Hypertension    Hypothyroidism 07/05/2015   Osteopenia    Osteoporosis 05/26/2015   old Dexa reviewed, pt states doesn't want to be on the bisphosphonate therapy but is taking Ca and D    Spinal stenosis of lumbar region at multiple levels 03/13/2018   MRI [facility] orthopedics January 2019    Family History  Problem Relation Age of Onset   Stroke Mother    Lung cancer Father    Heart disease Sister    Hypertension Sister    Hypertension Daughter    Rheum arthritis Daughter    Fibromyalgia Daughter    Colon cancer Neg Hx    Esophageal cancer Neg Hx     SOCIAL  HISTORY: Social History   Tobacco Use   Smoking status: Former    Years: 40.00    Types: Cigarettes   Smokeless tobacco: Never   Tobacco comments:    quit 2003  Substance Use Topics   Alcohol use: Yes    Alcohol/week: 1.0 standard drink    Types: 1 Glasses of wine per week    Comment: occasionally-rare    Allergies  Allergen Reactions   Alendronate Sodium     Esophagitis    Ace Inhibitors     Cough   Ezetimibe Other (See Comments)    Unknown rxn   Fenofibrate     Arthralgia    Rosuvastatin Calcium     Muscle aches   Statins Other (See Comments)    Muscle aches    Current Outpatient Medications  Medication Sig Dispense Refill   amLODipine (NORVASC) 5 MG tablet TAKE ONE TABLET BY MOUTH EVERY DAY 90 tablet 3   Ascorbic Acid (VITAMIN C) 1000 MG tablet Take 1,000 mg by mouth daily.     aspirin EC 81 MG tablet Take 1 tablet (81 mg total) by mouth daily.     chlorthalidone (HYGROTEN) 15 MG tablet Take 1 tablet (15 mg total) by mouth 2 (two) times a week. Tuesdays and Fridays once a day. 30 tablet 1   Coenzyme Q10 (COQ10) 100 MG CAPS Take 100 mg by mouth daily.      Cyanocobalamin (VITAMIN B-12 PO) Take 1 tablet by  mouth every other day.     diazepam (VALIUM) 2 MG tablet Take 0.5-1 tablets (1-2 mg total) by mouth every 8 (eight) hours as needed (dizziness/severe anxiety). #30 for 90 days 30 tablet 0   donepezil (ARICEPT) 5 MG tablet Take 5 mg by mouth at bedtime.     escitalopram (LEXAPRO) 20 MG tablet Take 1 tablet (20 mg total) by mouth daily. 90 tablet 0   famotidine (PEPCID) 20 MG tablet Take by mouth in the morning and at bedtime.     levothyroxine (SYNTHROID) 50 MCG tablet TAKE ONE TABLET BY MOUTH EVERY DAY 90 tablet 3   Multiple Vitamin (MULTIVITAMIN WITH MINERALS) TABS tablet Take 1 tablet by mouth daily.     Red Yeast Rice 600 MG CAPS Take 600 mg by mouth daily.      timolol (TIMOPTIC) 0.25 % ophthalmic solution Place 1 drop into both eyes 2 (two) times daily.       gabapentin (NEURONTIN) 100 MG capsule Take 2 capsules (200 mg total) by mouth 2 (two) times daily. 120 capsule 3   losartan (COZAAR) 25 MG tablet Take 1 tablet (25 mg total) by mouth daily. 30 tablet 0   No current facility-administered medications for this visit.    REVIEW OF SYSTEMS:  [X]  denotes positive finding, [ ]  denotes negative finding Cardiac  Comments:  Chest pain or chest pressure:    Shortness of breath upon exertion:    Short of breath when lying flat:    Irregular heart rhythm:        Vascular    Pain in calf, thigh, or hip brought on by ambulation:    Pain in feet at night that wakes you up from your sleep:     Blood clot in your veins:    Leg swelling:           PHYSICAL EXAM: Vitals:   10/04/21 1249  BP: (!) 158/74  Pulse: 61  Resp: 14  Temp: 97.7 F (36.5 C)  TempSrc: Temporal  SpO2: 94%  Weight: 133 lb 6.4 oz (60.5 kg)  Height: 5\' 6"  (1.676 m)    GENERAL: The patient is a well-nourished female, in no acute distress. The vital signs are documented above. CARDIOVASCULAR: She does have palpable left popliteal pulse.  I do not palpate pedal pulse. PULMONARY: There is good air exchange  MUSCULOSKELETAL: There are no major deformities or cyanosis. NEUROLOGIC: No focal weakness or paresthesias are detected. SKIN: There are no ulcers or rashes noted. PSYCHIATRIC: The patient has a normal affect.  DATA:  Noninvasive studies today revealed no significant change from her prior study a year ago.  Ankle arm index is 0.83 on the right and 0.67 on the left  Ultrasound imaging shows moderate stenosis in the superficial femoral artery but it is patent  MEDICAL ISSUES: I discussed these findings with the patient.  I feel that she is stable from arterial and venous standpoint.  She will notify should she develop any worsening swelling or ulceration.  Otherwise will be seen on an as-needed basis    Rosetta Posner, MD FACS [facility] Tel [phone]  Note: Portions of this report may have been transcribed using voice recognition software.  Every effort has been made to ensure accuracy; however, inadvertent computerized transcription errors may still be present.

## 2021-10-11 ENCOUNTER — Ambulatory Visit: Payer: Medicare Other | Admitting: Vascular Surgery

## 2022-01-03 ENCOUNTER — Encounter: Payer: Self-pay | Admitting: General Practice

## 2022-01-03 ENCOUNTER — Ambulatory Visit (INDEPENDENT_AMBULATORY_CARE_PROVIDER_SITE_OTHER): Payer: Medicare Other | Admitting: Family Medicine

## 2022-01-03 ENCOUNTER — Encounter: Payer: Self-pay | Admitting: Family Medicine

## 2022-01-03 VITALS — BP 156/65 | HR 87 | Temp 97.9°F | Wt 126.1 lb

## 2022-01-03 DIAGNOSIS — F02B4 Dementia in other diseases classified elsewhere, moderate, with anxiety: Secondary | ICD-10-CM | POA: Diagnosis not present

## 2022-01-03 DIAGNOSIS — G301 Alzheimer's disease with late onset: Secondary | ICD-10-CM

## 2022-01-03 DIAGNOSIS — E039 Hypothyroidism, unspecified: Secondary | ICD-10-CM

## 2022-01-03 DIAGNOSIS — I10 Essential (primary) hypertension: Secondary | ICD-10-CM

## 2022-01-03 NOTE — Progress Notes (Addendum)
Laura Ellis Imm - 88 y.o. female MRN [id number]  Date of birth: 01-[name]-1933  Subjective Chief Complaint  Patient presents with   Hypertension   Fall    HPI Laura Ellis is a 88 y.o. female here today for follow up visit.  She is accompanied by her 2 daughters today.  They report that she had a fall a few days ago after chasing a cat around her home.  Fell on her face.  Has extensive facial bruising as well as bruising on her arms.  She denies any significant pain at this time other than some pain along her nose.  She denies any loss of consciousness.  Her daughters report that she has had increasing confusion and aggression over the past couple months.  She has been very uncooperative and has discontinued all her medications.  She refuses to restart these.  She has been angry often to the point of throwing things at her daughters during disagreements.  Her daughters feel that she needs to be skilled nursing for at least rehabilitation and adamant that something needs to be done today.  ROS:  A comprehensive ROS was completed and negative except as noted per HPI  Allergies  Allergen Reactions   Alendronate Sodium     Esophagitis    Ace Inhibitors     Cough   Ezetimibe Other (See Comments)    Unknown rxn   Fenofibrate     Arthralgia    Other     Other reaction(s): dizziness   Rosuvastatin Calcium     Muscle aches   Statins Other (See Comments)    Muscle aches    Past Medical History:  Diagnosis Date   Arthritis    Chronic low back pain 05/26/2015   CKD (chronic kidney disease) stage 4, GFR 15-29 ml/min (HCC) 02/23/2016   Essential hypertension 05/26/2015   Glaucoma    Hypertension    Hypothyroidism 07/05/2015   Osteopenia    Osteoporosis 05/26/2015   old Dexa reviewed, pt states doesn't want to be on the bisphosphonate therapy but is taking Ca and D    Spinal stenosis of lumbar region at multiple levels 03/13/2018   MRI [facility] orthopedics January 2019    Past  Surgical History:  Procedure Laterality Date   ABDOMINAL AORTOGRAM W/LOWER EXTREMITY Left 12/29/2018   Procedure: ABDOMINAL AORTOGRAM W/LOWER EXTREMITY;  Surgeon: Waynetta Sandy, MD;  Location: [place] [facility];  Service: Cardiovascular;  Laterality: Left;   ABDOMINAL HYSTERECTOMY     COLONOSCOPY  2015   [facility]   ENDOVENOUS ABLATION SAPHENOUS VEIN W/ LASER Right 12/27/2016   endovenous laser ablation right greater saphenous vein by [name] MD   ESOPHAGOGASTRODUODENOSCOPY  2015   x2 or 3 times in [place]   ESOPHAGOGASTRODUODENOSCOPY (EGD) WITH ESOPHAGEAL DILATION N/A    KNEE ARTHROSCOPY     PERIPHERAL VASCULAR ATHERECTOMY  12/29/2018   Procedure: PERIPHERAL VASCULAR ATHERECTOMY;  Surgeon: Waynetta Sandy, MD;  Location: [place] [facility];  Service: Cardiovascular;;  left SFA   PERIPHERAL VASCULAR BALLOON ANGIOPLASTY  12/29/2018   Procedure: PERIPHERAL VASCULAR BALLOON ANGIOPLASTY;  Surgeon: Waynetta Sandy, MD;  Location: [place] [facility];  Service: Cardiovascular;;  Left SFA   VARICOSE VEIN SURGERY      Social History   Socioeconomic History   Marital status: Widowed    Spouse name: Not on file   Number of children: 3   Years of education: 14   Highest education level: Some  college, no degree  Occupational History   Occupation: Engineer, site    Comment: retired  Tobacco Use   Smoking status: Former    Years: 40.00    Types: Cigarettes   Smokeless tobacco: Never   Tobacco comments:    quit 2003  Vaping Use   Vaping Use: Never used  Substance and Sexual Activity   Alcohol use: Yes    Alcohol/week: 1.0 standard drink of alcohol    Types: 1 Glasses of wine per week    Comment: occasionally-rare   Drug use: No   Sexual activity: Not Currently  Other Topics Concern   Not on file  Social History Narrative   Feeds all her animals- cats- rescue   Chores around the house   Watches TV   Coffee 2 cups in  the am   Social Determinants of Health   Financial Resource Strain: Low Risk  (05/10/2020)   Overall Financial Resource Strain (CARDIA)    Difficulty of Paying Living Expenses: Not hard at all  Food Insecurity: No Food Insecurity (05/10/2020)   Hunger Vital Sign    Worried About Running Out of Food in the Last Year: Never true    Ran Out of Food in the Last Year: Never true  Transportation Needs: No Transportation Needs (05/10/2020)   PRAPARE - Hydrologist (Medical): No    Lack of Transportation (Non-Medical): No  Physical Activity: Inactive (05/10/2020)   Exercise Vital Sign    Days of Exercise per Week: 0 days    Minutes of Exercise per Session: 0 min  Stress: No Stress Concern Present (05/10/2020)   [place]    Feeling of Stress : Not at all  Social Connections: Socially Isolated (05/10/2020)   Social Connection and Isolation Panel [NHANES]    Frequency of Communication with Friends and Family: More than three times a week    Frequency of Social Gatherings with Friends and Family: More than three times a week    Attends Religious Services: Never    Marine scientist or Organizations: No    Attends Archivist Meetings: Never    Marital Status: Widowed    Family History  Problem Relation Age of Onset   Stroke Mother    Lung cancer Father    Heart disease Sister    Hypertension Sister    Hypertension Daughter    Rheum arthritis Daughter    Fibromyalgia Daughter    Colon cancer Neg Hx    Esophageal cancer Neg Hx     Health Maintenance  Topic Date Due   Zoster Vaccines- Shingrix (1 of 2) Never done   COVID-19 Vaccine (4 - Pfizer series) 05/03/2020   INFLUENZA VACCINE  12/12/2021   TETANUS/TDAP  11/20/2023   Pneumonia Vaccine 50+ Years old  Completed   DEXA SCAN  Completed   HPV VACCINES  Aged Out      ----------------------------------------------------------------------------------------------------------------------------------------------------------------------------------------------------------------- Physical Exam BP (!) 156/65 (BP Location: Left Arm, Patient Position: Sitting, Cuff Size: Normal)   Pulse 87   Temp 97.9 F (36.6 C) (Oral)   Wt 126 lb 1.9 oz (57.2 kg)   SpO2 99%   BMI 20.36 kg/m   Physical Exam Constitutional:      Appearance: Normal appearance.  HENT:     Head:     Comments: She has extensive facial bruising of the forehead and around the eyes. Cardiovascular:     Rate  and Rhythm: Normal rate and regular rhythm.  Pulmonary:     Effort: Pulmonary effort is normal.  Musculoskeletal:     Cervical back: Neck supple.  Neurological:     Mental Status: She is alert.     ------------------------------------------------------------------------------------------------------------------------------------------------------------------------------------------------------------------- Assessment and Plan  Dementia ([place]) She has had increased behavioral disturbances.  They are adamant about having skilled nursing admission.  Discussed that we could get this set up but they would need to find a facility where they would want her placed.  Alternatively discussed having her evaluated in the emergency room given her recent fall and extensive facial bruising to rule out facial fractures or intracranial process remains if we can have her admitted to skilled nursing from hospital.  Additionally they can check for other causes of her visual changes including infectious etiology such as UTI.  Additionally she has been off all medications including her thyroid medication which may be contributing some.  Essential hypertension She refuses to take any of her medication at this time.  Blood pressure is elevated today.  She is unable to comprehend importance of taking her  medications at this time.  35 minutes spent including pre visit preparation, review of prior notes and labs, encounter with patient and same day documentation.  No orders of the defined types were placed in this encounter.   No follow-ups on file.    This visit occurred during the SARS-CoV-2 public health emergency.  Safety protocols were in place, including screening questions prior to the visit, additional usage of staff PPE, and extensive cleaning of exam room while observing appropriate contact time as indicated for disinfecting solutions.

## 2022-01-07 ENCOUNTER — Encounter: Payer: Self-pay | Admitting: Family Medicine

## 2022-01-07 NOTE — Assessment & Plan Note (Addendum)
She has had increased behavioral disturbances.  They are adamant about having skilled nursing admission.  Discussed that we could get this set up but they would need to find a facility where they would want her placed.  Alternatively discussed having her evaluated in the emergency room given her recent fall and extensive facial bruising to rule out facial fractures or intracranial process remains if we can have her admitted to skilled nursing from hospital.  Additionally they can check for other causes of her visual changes including infectious etiology such as UTI.  Additionally she has been off all medications including her thyroid medication which may be contributing some.

## 2022-01-07 NOTE — Assessment & Plan Note (Addendum)
She refuses to take any of her medication at this time.  Blood pressure is elevated today.  She is unable to comprehend importance of taking her medications at this time.

## 2022-01-08 ENCOUNTER — Telehealth: Payer: Self-pay | Admitting: General Practice

## 2022-01-08 NOTE — Telephone Encounter (Signed)
Transition Care Management Follow-up Telephone Call Date of discharge and from where: 01/06/22 from [facility] How have you been since you were released from the hospital? Patient's daughter, Sharyn Lull, stated that patient is still not taking her meds. Daughter is interested in skilled nursing facility.  Any questions or concerns? No  Items Reviewed: Did the pt receive and understand the discharge instructions provided? Yes  Medications obtained and verified? No  Other? No  Any new allergies since your discharge? No  Dietary orders reviewed? Yes Do you have support at home? Yes   Home Care and Equipment/Supplies: Were home health services ordered? Yes. She has not heard anything yet.  Functional Questionnaire: (I = Independent and D = Dependent) ADLs: I  Bathing/Dressing- I  Meal Prep- I  Eating- I  Maintaining continence- I  Transferring/Ambulation- I  Managing Meds- I  Follow up appointments reviewed:  PCP Hospital f/u appt confirmed? No  Her daughter, Sharyn Lull would like to call back to schedule a follow up. [facility] f/u appt confirmed? No   Are transportation arrangements needed? No  If their condition worsens, is the pt aware to call PCP or go to the Emergency Dept.? Yes Was the patient provided with contact information for the PCP's office or ED? Yes Was to pt encouraged to call back with questions or concerns? Yes

## 2022-02-06 ENCOUNTER — Inpatient Hospital Stay: Payer: Medicare Other | Admitting: Family Medicine

## 2022-02-14 ENCOUNTER — Encounter: Payer: Self-pay | Admitting: Family Medicine

## 2022-02-14 ENCOUNTER — Ambulatory Visit (INDEPENDENT_AMBULATORY_CARE_PROVIDER_SITE_OTHER): Payer: Medicare Other | Admitting: Family Medicine

## 2022-02-14 DIAGNOSIS — E039 Hypothyroidism, unspecified: Secondary | ICD-10-CM

## 2022-02-14 DIAGNOSIS — F02B4 Dementia in other diseases classified elsewhere, moderate, with anxiety: Secondary | ICD-10-CM | POA: Diagnosis not present

## 2022-02-14 DIAGNOSIS — G301 Alzheimer's disease with late onset: Secondary | ICD-10-CM | POA: Diagnosis not present

## 2022-02-14 DIAGNOSIS — I10 Essential (primary) hypertension: Secondary | ICD-10-CM | POA: Diagnosis not present

## 2022-02-15 ENCOUNTER — Other Ambulatory Visit: Payer: Self-pay | Admitting: Family Medicine

## 2022-02-15 LAB — CBC WITH DIFFERENTIAL/PLATELET
Absolute Monocytes: 610 cells/uL (ref 200–950)
Basophils Absolute: 60 cells/uL (ref 0–200)
Basophils Relative: 0.9 %
Eosinophils Absolute: 302 cells/uL (ref 15–500)
Eosinophils Relative: 4.5 %
HCT: 36.7 % (ref 35.0–45.0)
Hemoglobin: 12.2 g/dL (ref 11.7–15.5)
Lymphs Abs: 1722 cells/uL (ref 850–3900)
MCH: 32.1 pg (ref 27.0–33.0)
MCHC: 33.2 g/dL (ref 32.0–36.0)
MCV: 96.6 fL (ref 80.0–100.0)
MPV: 9.7 fL (ref 7.5–12.5)
Monocytes Relative: 9.1 %
Neutro Abs: 4007 cells/uL (ref 1500–7800)
Neutrophils Relative %: 59.8 %
Platelets: 270 10*3/uL (ref 140–400)
RBC: 3.8 10*6/uL (ref 3.80–5.10)
RDW: 12.7 % (ref 11.0–15.0)
Total Lymphocyte: 25.7 %
WBC: 6.7 10*3/uL (ref 3.8–10.8)

## 2022-02-15 LAB — BASIC METABOLIC PANEL
BUN/Creatinine Ratio: 23 (calc) — ABNORMAL HIGH (ref 6–22)
BUN: 42 mg/dL — ABNORMAL HIGH (ref 7–25)
CO2: 24 mmol/L (ref 20–32)
Calcium: 9.1 mg/dL (ref 8.6–10.4)
Chloride: 105 mmol/L (ref 98–110)
Creat: 1.85 mg/dL — ABNORMAL HIGH (ref 0.60–0.95)
Glucose, Bld: 75 mg/dL (ref 65–99)
Potassium: 4.9 mmol/L (ref 3.5–5.3)
Sodium: 138 mmol/L (ref 135–146)

## 2022-02-15 LAB — TSH: TSH: 2.15 mIU/L (ref 0.40–4.50)

## 2022-02-15 NOTE — Progress Notes (Signed)
Laura Ellis - 88 y.o. female MRN [id number]  Date of birth: 03/10/1933  Subjective Chief Complaint  Patient presents with   Hospitalization Follow-up    HPI [name] is an 88 year old female here today for hospital follow-up.  She is accompanied by her daughter today.  Reports she is doing well at this time.  Had increased altered mental status at previous visit as well as recent fall with significant facial bruising.  Recommended ED visit at that time.  Noted to have UTI and facial fractures.  UTI treated mental status has improved some.  She is not as irritable at this time.  Her daughter has been trying to get her to sign POA papers to help with her financial affairs and medical decision making.  So far the patient has refused to do this.  She is taking her medications regularly again at this point..  ROS:  A comprehensive ROS was completed and negative except as noted per HPI    Allergies  Allergen Reactions   Alendronate Sodium     Esophagitis    Ace Inhibitors     Cough   Ezetimibe Other (See Comments)    Unknown rxn   Fenofibrate     Arthralgia    Other     Other reaction(s): dizziness   Rosuvastatin Calcium     Muscle aches   Statins Other (See Comments)    Muscle aches    Past Medical History:  Diagnosis Date   Arthritis    Chronic low back pain 05/26/2015   CKD (chronic kidney disease) stage 4, GFR 15-29 ml/min (HCC) 02/23/2016   Essential hypertension 05/26/2015   Glaucoma    Hypertension    Hypothyroidism 07/05/2015   Osteopenia    Osteoporosis 05/26/2015   old Dexa reviewed, pt states doesn't want to be on the bisphosphonate therapy but is taking Ca and D    Spinal stenosis of lumbar region at multiple levels 03/13/2018   MRI [facility] orthopedics January 2019    Past Surgical History:  Procedure Laterality Date   ABDOMINAL AORTOGRAM W/LOWER EXTREMITY Left 12/29/2018   Procedure: ABDOMINAL AORTOGRAM W/LOWER EXTREMITY;  Surgeon: Waynetta Sandy, MD;  Location: [facility] [place] [facility];  Service: Cardiovascular;  Laterality: Left;   ABDOMINAL HYSTERECTOMY     COLONOSCOPY  2015   [facility]   ENDOVENOUS ABLATION SAPHENOUS VEIN W/ LASER Right 12/27/2016   endovenous laser ablation right greater saphenous vein by [name] MD   ESOPHAGOGASTRODUODENOSCOPY  2015   x2 or 3 times in [place]   ESOPHAGOGASTRODUODENOSCOPY (EGD) WITH ESOPHAGEAL DILATION N/A    KNEE ARTHROSCOPY     PERIPHERAL VASCULAR ATHERECTOMY  12/29/2018   Procedure: PERIPHERAL VASCULAR ATHERECTOMY;  Surgeon: Waynetta Sandy, MD;  Location: [place] [facility];  Service: Cardiovascular;;  left SFA   PERIPHERAL VASCULAR BALLOON ANGIOPLASTY  12/29/2018   Procedure: PERIPHERAL VASCULAR BALLOON ANGIOPLASTY;  Surgeon: Waynetta Sandy, MD;  Location: [place] [facility];  Service: Cardiovascular;;  Left SFA   VARICOSE VEIN SURGERY      Social History   Socioeconomic History   Marital status: Widowed    Spouse name: Not on file   Number of children: 3   Years of education: 14   Highest education level: Some college, no degree  Occupational History   Occupation: Engineer, site    Comment: retired  Tobacco Use   Smoking status: Former    Years: 40.00    Types: Cigarettes  Smokeless tobacco: Never   Tobacco comments:    quit 2003  Vaping Use   Vaping Use: Never used  Substance and Sexual Activity   Alcohol use: Yes    Alcohol/week: 1.0 standard drink of alcohol    Types: 1 Glasses of wine per week    Comment: occasionally-rare   Drug use: No   Sexual activity: Not Currently  Other Topics Concern   Not on file  Social History Narrative   Feeds all her animals- cats- rescue   Chores around the house   Watches TV   Coffee 2 cups in the am   Social Determinants of Health   Financial Resource Strain: Low Risk  (05/10/2020)   Overall Financial Resource Strain (CARDIA)    Difficulty of Paying Living  Expenses: Not hard at all  Food Insecurity: No Food Insecurity (05/10/2020)   Hunger Vital Sign    Worried About Running Out of Food in the Last Year: Never true    Ran Out of Food in the Last Year: Never true  Transportation Needs: No Transportation Needs (05/10/2020)   PRAPARE - Hydrologist (Medical): No    Lack of Transportation (Non-Medical): No  Physical Activity: Inactive (05/10/2020)   Exercise Vital Sign    Days of Exercise per Week: 0 days    Minutes of Exercise per Session: 0 min  Stress: No Stress Concern Present (05/10/2020)   [place]    Feeling of Stress : Not at all  Social Connections: Socially Isolated (05/10/2020)   Social Connection and Isolation Panel [NHANES]    Frequency of Communication with Friends and Family: More than three times a week    Frequency of Social Gatherings with Friends and Family: More than three times a week    Attends Religious Services: Never    Marine scientist or Organizations: No    Attends Archivist Meetings: Never    Marital Status: Widowed    Family History  Problem Relation Age of Onset   Stroke Mother    Lung cancer Father    Heart disease Sister    Hypertension Sister    Hypertension Daughter    Rheum arthritis Daughter    Fibromyalgia Daughter    Colon cancer Neg Hx    Esophageal cancer Neg Hx     Health Maintenance  Topic Date Due   COVID-19 Vaccine (4 - Pfizer series) 03/02/2022 (Originally 05/03/2020)   Zoster Vaccines- Shingrix (1 of 2) 05/17/2022 (Originally 02/26/1985)   TETANUS/TDAP  11/20/2023   Pneumonia Vaccine 41+ Years old  Completed   INFLUENZA VACCINE  Completed   DEXA SCAN  Completed   HPV VACCINES  Aged Out      ----------------------------------------------------------------------------------------------------------------------------------------------------------------------------------------------------------------- Physical Exam There were no vitals taken for this visit.  Physical Exam Constitutional:      Appearance: Normal appearance.  Eyes:     General: No scleral icterus. Cardiovascular:     Rate and Rhythm: Normal rate and regular rhythm.  Pulmonary:     Effort: Pulmonary effort is normal.     Breath sounds: Normal breath sounds.  Musculoskeletal:     Cervical back: Neck supple.  Neurological:     Mental Status: She is alert.  Psychiatric:        Mood and Affect: Mood normal.        Behavior: Behavior normal.     ------------------------------------------------------------------------------------------------------------------------------------------------------------------------------------------------------------------- Assessment and Plan  Dementia ([place]) Altered  mental status at previous visit.  Improved with treatment of UTI.  She is back to baseline at this point.  She will continue donepezil at current strength.  I do not think she is able to make well-informed medical decisions for herself at this point.  I do think that she needs guardianship and discussed that her daughter can apply for this through the court system.  Hypothyroidism She is taking levothyroxine again.  Update TSH.  Essential hypertension Blood pressure stable at this time.   No orders of the defined types were placed in this encounter.   No follow-ups on file.    This visit occurred during the SARS-CoV-2 public health emergency.  Safety protocols were in place, including screening questions prior to the visit, additional usage of staff PPE, and extensive cleaning of exam room while observing appropriate contact time as indicated for disinfecting solutions.

## 2022-02-15 NOTE — Assessment & Plan Note (Signed)
Altered mental status at previous visit.  Improved with treatment of UTI.  She is back to baseline at this point.  She will continue donepezil at current strength.  I do not think she is able to make well-informed medical decisions for herself at this point.  I do think that she needs guardianship and discussed that her daughter can apply for this through the court system.

## 2022-02-15 NOTE — Assessment & Plan Note (Signed)
She is taking levothyroxine again.  Update TSH.

## 2022-02-15 NOTE — Assessment & Plan Note (Signed)
Blood pressure stable at this time. °

## 2022-03-09 ENCOUNTER — Telehealth: Payer: Self-pay

## 2022-03-09 NOTE — Telephone Encounter (Signed)
Received vm from [facility] @ [facility] [phone]). VO's (per Dr. Novella Olive for 6 visit for ST coginitive retraining.

## 2022-05-10 ENCOUNTER — Telehealth: Payer: Self-pay

## 2022-05-10 NOTE — Telephone Encounter (Signed)
Received vm from [facility] @ [facility] [phone].  Laura Ellis is being discharged from [place] also she's reached Home Health maximum. Patient has severe dementia.

## 2022-05-15 ENCOUNTER — Other Ambulatory Visit: Payer: Self-pay | Admitting: Family Medicine

## 2022-05-21 ENCOUNTER — Ambulatory Visit (INDEPENDENT_AMBULATORY_CARE_PROVIDER_SITE_OTHER): Payer: Medicare Other | Admitting: Family Medicine

## 2022-05-21 ENCOUNTER — Encounter: Payer: Self-pay | Admitting: Family Medicine

## 2022-05-21 ENCOUNTER — Ambulatory Visit (INDEPENDENT_AMBULATORY_CARE_PROVIDER_SITE_OTHER): Payer: Medicare Other

## 2022-05-21 VITALS — BP 149/70 | HR 79 | Ht 66.0 in

## 2022-05-21 DIAGNOSIS — F02B4 Dementia in other diseases classified elsewhere, moderate, with anxiety: Secondary | ICD-10-CM | POA: Diagnosis not present

## 2022-05-21 DIAGNOSIS — I10 Essential (primary) hypertension: Secondary | ICD-10-CM | POA: Diagnosis not present

## 2022-05-21 DIAGNOSIS — M545 Low back pain, unspecified: Secondary | ICD-10-CM | POA: Insufficient documentation

## 2022-05-21 DIAGNOSIS — W19XXXA Unspecified fall, initial encounter: Secondary | ICD-10-CM

## 2022-05-21 DIAGNOSIS — R410 Disorientation, unspecified: Secondary | ICD-10-CM

## 2022-05-21 DIAGNOSIS — L89522 Pressure ulcer of left ankle, stage 2: Secondary | ICD-10-CM | POA: Insufficient documentation

## 2022-05-21 DIAGNOSIS — G301 Alzheimer's disease with late onset: Secondary | ICD-10-CM | POA: Diagnosis not present

## 2022-05-21 MED ORDER — DUODERM CGF EXTRA THIN EX MISC: CUTANEOUS | 1 refills | Status: AC

## 2022-05-21 NOTE — Assessment & Plan Note (Signed)
We discussed trying to offload and/or float the ankle at home.  Adding DuoDERM dressing to area no signs of infection at this time.

## 2022-05-21 NOTE — Assessment & Plan Note (Signed)
Blood pressure mildly elevated however acceptable for her age. Recommend continuation of current medications.

## 2022-05-21 NOTE — Assessment & Plan Note (Signed)
Doing well with donepezil at current strength.  Additionally we will continue Lexapro for coexisting depression and anxiety.

## 2022-05-21 NOTE — Assessment & Plan Note (Signed)
Acute on chronic low back pain.  She did have a recent fall.  Updated x-rays of lumbar spine ordered today.

## 2022-05-21 NOTE — Progress Notes (Signed)
Laura Ellis - 89 y.o. female MRN [id number]  Date of birth: Apr 13, 1933  Subjective Chief Complaint  Patient presents with   Follow-up    HPI Laura Ellis is an 89 year old female brought in today by her daughter.  Reports she is doing fairly well at this time.  Has had some increased back pain as of recent.  She did have a fall about 2 weeks ago.  No radiation of pain into the legs, numbness, tingling or increased incontinence.  She has had some urinary frequency.  Denies burning with urination or increased confusion.  She has not had any fever or chills.  She does have an ulceration on the lateral right ankle.  Her daughter has been using antibiotic ointment and covering.  Mild pain.  No bleeding or drainage.  Remains on donepezil and Lexapro for management of dementia and associated anxiety.  Doing well with this at this time.  Blood pressure is mildly elevated.  Continues on losartan and amlodipine.  Denies chest pain, shortness of breath, palpitations, headaches or vision changes.  ROS:  A comprehensive ROS was completed and negative except as noted per HPI  Allergies  Allergen Reactions   Alendronate Sodium     Esophagitis    Ace Inhibitors     Cough   Ezetimibe Other (See Comments)    Unknown rxn   Fenofibrate     Arthralgia    Other     Other reaction(s): dizziness   Rosuvastatin Calcium     Muscle aches   Statins Other (See Comments)    Muscle aches    Past Medical History:  Diagnosis Date   Arthritis    Chronic low back pain 05/26/2015   CKD (chronic kidney disease) stage 4, GFR 15-29 ml/min (HCC) 02/23/2016   Essential hypertension 05/26/2015   Glaucoma    Hypertension    Hypothyroidism 07/05/2015   Osteopenia    Osteoporosis 05/26/2015   old Dexa reviewed, pt states doesn't want to be on the bisphosphonate therapy but is taking Ca and D    Spinal stenosis of lumbar region at multiple levels 03/13/2018   MRI [facility] orthopedics January 2019    Past  Surgical History:  Procedure Laterality Date   ABDOMINAL AORTOGRAM W/LOWER EXTREMITY Left 12/29/2018   Procedure: ABDOMINAL AORTOGRAM W/LOWER EXTREMITY;  Surgeon: Waynetta Sandy, MD;  Location: [place] [facility];  Service: Cardiovascular;  Laterality: Left;   ABDOMINAL HYSTERECTOMY     COLONOSCOPY  2015   [facility]   ENDOVENOUS ABLATION SAPHENOUS VEIN W/ LASER Right 12/27/2016   endovenous laser ablation right greater saphenous vein by [name] MD   ESOPHAGOGASTRODUODENOSCOPY  2015   x2 or 3 times in [place]   ESOPHAGOGASTRODUODENOSCOPY (EGD) WITH ESOPHAGEAL DILATION N/A    KNEE ARTHROSCOPY     PERIPHERAL VASCULAR ATHERECTOMY  12/29/2018   Procedure: PERIPHERAL VASCULAR ATHERECTOMY;  Surgeon: Waynetta Sandy, MD;  Location: [place] [facility];  Service: Cardiovascular;;  left SFA   PERIPHERAL VASCULAR BALLOON ANGIOPLASTY  12/29/2018   Procedure: PERIPHERAL VASCULAR BALLOON ANGIOPLASTY;  Surgeon: Waynetta Sandy, MD;  Location: [place] [facility];  Service: Cardiovascular;;  Left SFA   VARICOSE VEIN SURGERY      Social History   Socioeconomic History   Marital status: Widowed    Spouse name: Not on file   Number of children: 3   Years of education: 14   Highest education level: Some college, no degree  Occupational History   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  Tobacco Use   Smoking status: Former    Years: 40.00    Types: Cigarettes   Smokeless tobacco: Never   Tobacco comments:    quit 2003  Vaping Use   Vaping Use: Never used  Substance and Sexual Activity   Alcohol use: Yes    Alcohol/week: 1.0 standard drink of alcohol    Types: 1 Glasses of wine per week    Comment: occasionally-rare   Drug use: No   Sexual activity: Not Currently  Other Topics Concern   Not on file  Social History Narrative   Feeds all her animals- cats- rescue   Chores around the house   Watches TV   Coffee 2 cups in  the am   Social Determinants of Health   Financial Resource Strain: Low Risk  (05/10/2020)   Overall Financial Resource Strain (CARDIA)    Difficulty of Paying Living Expenses: Not hard at all  Food Insecurity: No Food Insecurity (05/10/2020)   Hunger Vital Sign    Worried About Running Out of Food in the Last Year: Never true    Ran Out of Food in the Last Year: Never true  Transportation Needs: No Transportation Needs (05/10/2020)   PRAPARE - Hydrologist (Medical): No    Lack of Transportation (Non-Medical): No  Physical Activity: Inactive (05/10/2020)   Exercise Vital Sign    Days of Exercise per Week: 0 days    Minutes of Exercise per Session: 0 min  Stress: No Stress Concern Present (05/10/2020)   [place]    Feeling of Stress : Not at all  Social Connections: Socially Isolated (05/10/2020)   Social Connection and Isolation Panel [NHANES]    Frequency of Communication with Friends and Family: More than three times a week    Frequency of Social Gatherings with Friends and Family: More than three times a week    Attends Religious Services: Never    Marine scientist or Organizations: No    Attends Archivist Meetings: Never    Marital Status: Widowed    Family History  Problem Relation Age of Onset   Stroke Mother    Lung cancer Father    Heart disease Sister    Hypertension Sister    Hypertension Daughter    Rheum arthritis Daughter    Fibromyalgia Daughter    Colon cancer Neg Hx    Esophageal cancer Neg Hx     Health Maintenance  Topic Date Due   Zoster Vaccines- Shingrix (1 of 2) Never done   Medicare Annual Wellness (AWV)  05/10/2021   DTaP/Tdap/Td (3 - Td or Tdap) 11/20/2023   Pneumonia Vaccine 61+ Years old  Completed   INFLUENZA VACCINE  Completed   DEXA SCAN  Completed   COVID-19 Vaccine  Completed   HPV VACCINES  Aged Out      ----------------------------------------------------------------------------------------------------------------------------------------------------------------------------------------------------------------- Physical Exam BP (!) 149/70 (BP Location: Right Arm, Patient Position: Sitting, Cuff Size: Small)   Pulse 79   Ht 5\' 6"  (1.676 m)   SpO2 97%   BMI 20.36 kg/m   Physical Exam Constitutional:      Appearance: Normal appearance.  HENT:     Head: Normocephalic and atraumatic.  Cardiovascular:     Rate and Rhythm: Normal rate and regular rhythm.  Pulmonary:     Effort: Pulmonary effort is normal.     Breath  sounds: Normal breath sounds.  Musculoskeletal:     Cervical back: Neck supple.  Skin:    Comments: Ulceration of the right lateral malleolus.  Some surrounding erythema with sloughing of the epidermis.  Neurological:     Mental Status: She is alert.     ------------------------------------------------------------------------------------------------------------------------------------------------------------------------------------------------------------------- Assessment and Plan  Elevated blood pressure reading with diagnosis of hypertension Blood pressure mildly elevated however acceptable for her age. Recommend continuation of current medications.  Dementia ([place]) Doing well with donepezil at current strength.  Additionally we will continue Lexapro for coexisting depression and anxiety.  Acute midline low back pain without sciatica Acute on chronic low back pain.  She did have a recent fall.  Updated x-rays of lumbar spine ordered today.  Pressure ulcer of left ankle, stage 2 (HCC) We discussed trying to offload and/or float the ankle at home.  Adding DuoDERM dressing to area no signs of infection at this time.   Meds ordered this encounter  Medications   Control Gel Formula Dressing (DUODERM CGF EXTRA THIN) MISC    Sig: Apply every 72 hours     Dispense:  10 each    Refill:  1    No follow-ups on file.    This visit occurred during the SARS-CoV-2 public health emergency.  Safety protocols were in place, including screening questions prior to the visit, additional usage of staff PPE, and extensive cleaning of exam room while observing appropriate contact time as indicated for disinfecting solutions.

## 2022-06-14 ENCOUNTER — Other Ambulatory Visit: Payer: Self-pay | Admitting: Family Medicine

## 2022-06-25 ENCOUNTER — Other Ambulatory Visit: Payer: Self-pay | Admitting: Family Medicine

## 2022-07-02 ENCOUNTER — Telehealth: Payer: Self-pay | Admitting: Family Medicine

## 2022-07-02 NOTE — Telephone Encounter (Signed)
Prescription sent in for wound on ankle (duoderm) was sent to [facility]. Pharmacy states that they do not prescribe duoderm and patient will need to go to a medical supply store. Also states that X-Ray was done on her back but she never got the results and would like a call about it. Laura Ellis

## 2022-07-02 NOTE — Telephone Encounter (Signed)
It has been almost 1.5 months since this was sent.  Does she still need this?  Please see previous result note sent via mychart for imaging results.

## 2022-07-02 NOTE — Telephone Encounter (Signed)
Contacted Laura Ellis to schedule their annual wellness visit. Patient declined to schedule AWV at this time.  [name] Patient Arts administrator II Direct Dial: [phone]

## 2022-07-03 NOTE — Telephone Encounter (Signed)
Attempted call to patient. Left a voice mail message requesting a return call.

## 2022-07-04 NOTE — Telephone Encounter (Signed)
Attempted call to patient. Left voice mail message requesting a return call.

## 2022-07-12 NOTE — Telephone Encounter (Signed)
Patient's daughter advised.  

## 2022-08-31 ENCOUNTER — Other Ambulatory Visit: Payer: Self-pay | Admitting: Family Medicine

## 2022-09-17 ENCOUNTER — Telehealth: Payer: Self-pay | Admitting: Family Medicine

## 2022-09-17 ENCOUNTER — Other Ambulatory Visit: Payer: Self-pay | Admitting: Family Medicine

## 2022-09-17 NOTE — Telephone Encounter (Signed)
Patient called requesting a refill of diazepam (VALIUM) 2 MG tablet [[id number]]  DISCONTINUED . Patient only has 2 pills left. Only takes it once every 6 months.   Pharmacy -  [facility] - [place], [place] - [address]

## 2022-09-19 ENCOUNTER — Other Ambulatory Visit: Payer: Self-pay | Admitting: Family Medicine

## 2022-09-19 NOTE — Telephone Encounter (Signed)
We'll need a visit since this is not something I have prescribed for her in the past.  CM

## 2022-09-19 NOTE — Telephone Encounter (Signed)
[name]'s daughter called and states she take diazepam rarely for vertigo. She last had a prescription filled in 2020. She wanted to know if Dr Ashley Royalty would take over prescribing this medication.   She is currently having dizzy spells.

## 2022-09-20 NOTE — Telephone Encounter (Signed)
I called Maralyn Sago back and advised her that [name] will need an appointment. She will call back to schedule.

## 2023-02-15 ENCOUNTER — Emergency Department (HOSPITAL_COMMUNITY): Payer: Medicare Other

## 2023-02-15 ENCOUNTER — Inpatient Hospital Stay (HOSPITAL_COMMUNITY)
Admission: EM | Admit: 2023-02-15 | Discharge: 2023-02-22 | DRG: 481 | Disposition: A | Discharge status: Skilled Nursing Facility | Payer: Medicare Other | Attending: Internal Medicine | Admitting: Internal Medicine

## 2023-02-15 ENCOUNTER — Encounter (HOSPITAL_COMMUNITY): Payer: Self-pay | Admitting: Emergency Medicine

## 2023-02-15 DIAGNOSIS — W010XXA Fall on same level from slipping, tripping and stumbling without subsequent striking against object, initial encounter: Secondary | ICD-10-CM | POA: Diagnosis present

## 2023-02-15 DIAGNOSIS — F0154 Vascular dementia, unspecified severity, with anxiety: Secondary | ICD-10-CM | POA: Diagnosis present

## 2023-02-15 DIAGNOSIS — I10 Essential (primary) hypertension: Secondary | ICD-10-CM | POA: Diagnosis present

## 2023-02-15 DIAGNOSIS — Z79890 Hormone replacement therapy (postmenopausal): Secondary | ICD-10-CM

## 2023-02-15 DIAGNOSIS — Z8249 Family history of ischemic heart disease and other diseases of the circulatory system: Secondary | ICD-10-CM | POA: Diagnosis not present

## 2023-02-15 DIAGNOSIS — Z87891 Personal history of nicotine dependence: Secondary | ICD-10-CM | POA: Diagnosis not present

## 2023-02-15 DIAGNOSIS — Z7189 Other specified counseling: Secondary | ICD-10-CM | POA: Diagnosis not present

## 2023-02-15 DIAGNOSIS — S72141A Displaced intertrochanteric fracture of right femur, initial encounter for closed fracture: Principal | ICD-10-CM | POA: Diagnosis present

## 2023-02-15 DIAGNOSIS — Z79899 Other long term (current) drug therapy: Secondary | ICD-10-CM

## 2023-02-15 DIAGNOSIS — M48061 Spinal stenosis, lumbar region without neurogenic claudication: Secondary | ICD-10-CM | POA: Diagnosis present

## 2023-02-15 DIAGNOSIS — F0153 Vascular dementia, unspecified severity, with mood disturbance: Secondary | ICD-10-CM | POA: Diagnosis present

## 2023-02-15 DIAGNOSIS — I129 Hypertensive chronic kidney disease with stage 1 through stage 4 chronic kidney disease, or unspecified chronic kidney disease: Secondary | ICD-10-CM | POA: Diagnosis present

## 2023-02-15 DIAGNOSIS — S7291XA Unspecified fracture of right femur, initial encounter for closed fracture: Secondary | ICD-10-CM | POA: Diagnosis present

## 2023-02-15 DIAGNOSIS — Z801 Family history of malignant neoplasm of trachea, bronchus and lung: Secondary | ICD-10-CM

## 2023-02-15 DIAGNOSIS — D62 Acute posthemorrhagic anemia: Secondary | ICD-10-CM | POA: Diagnosis not present

## 2023-02-15 DIAGNOSIS — R627 Adult failure to thrive: Secondary | ICD-10-CM | POA: Diagnosis present

## 2023-02-15 DIAGNOSIS — E872 Acidosis, unspecified: Secondary | ICD-10-CM | POA: Diagnosis not present

## 2023-02-15 DIAGNOSIS — N184 Chronic kidney disease, stage 4 (severe): Secondary | ICD-10-CM | POA: Diagnosis present

## 2023-02-15 DIAGNOSIS — G301 Alzheimer's disease with late onset: Secondary | ICD-10-CM | POA: Diagnosis not present

## 2023-02-15 DIAGNOSIS — I739 Peripheral vascular disease, unspecified: Secondary | ICD-10-CM | POA: Diagnosis present

## 2023-02-15 DIAGNOSIS — E785 Hyperlipidemia, unspecified: Secondary | ICD-10-CM | POA: Diagnosis present

## 2023-02-15 DIAGNOSIS — Z66 Do not resuscitate: Secondary | ICD-10-CM | POA: Diagnosis not present

## 2023-02-15 DIAGNOSIS — E039 Hypothyroidism, unspecified: Secondary | ICD-10-CM | POA: Diagnosis present

## 2023-02-15 DIAGNOSIS — N179 Acute kidney failure, unspecified: Secondary | ICD-10-CM | POA: Diagnosis not present

## 2023-02-15 DIAGNOSIS — S72351A Displaced comminuted fracture of shaft of right femur, initial encounter for closed fracture: Secondary | ICD-10-CM | POA: Diagnosis not present

## 2023-02-15 DIAGNOSIS — Z90710 Acquired absence of both cervix and uterus: Secondary | ICD-10-CM | POA: Diagnosis not present

## 2023-02-15 DIAGNOSIS — Z7982 Long term (current) use of aspirin: Secondary | ICD-10-CM

## 2023-02-15 DIAGNOSIS — Y92009 Unspecified place in unspecified non-institutional (private) residence as the place of occurrence of the external cause: Secondary | ICD-10-CM

## 2023-02-15 DIAGNOSIS — Z9862 Peripheral vascular angioplasty status: Secondary | ICD-10-CM

## 2023-02-15 DIAGNOSIS — K219 Gastro-esophageal reflux disease without esophagitis: Secondary | ICD-10-CM | POA: Diagnosis present

## 2023-02-15 DIAGNOSIS — S728X1A Other fracture of right femur, initial encounter for closed fracture: Secondary | ICD-10-CM | POA: Diagnosis not present

## 2023-02-15 DIAGNOSIS — F02B4 Dementia in other diseases classified elsewhere, moderate, with anxiety: Secondary | ICD-10-CM

## 2023-02-15 DIAGNOSIS — F32A Depression, unspecified: Secondary | ICD-10-CM | POA: Diagnosis present

## 2023-02-15 DIAGNOSIS — S72001A Fracture of unspecified part of neck of right femur, initial encounter for closed fracture: Secondary | ICD-10-CM

## 2023-02-15 DIAGNOSIS — Z515 Encounter for palliative care: Secondary | ICD-10-CM | POA: Diagnosis not present

## 2023-02-15 DIAGNOSIS — H9190 Unspecified hearing loss, unspecified ear: Secondary | ICD-10-CM | POA: Diagnosis present

## 2023-02-15 DIAGNOSIS — H409 Unspecified glaucoma: Secondary | ICD-10-CM | POA: Diagnosis present

## 2023-02-15 DIAGNOSIS — I959 Hypotension, unspecified: Secondary | ICD-10-CM | POA: Diagnosis not present

## 2023-02-15 DIAGNOSIS — Z9109 Other allergy status, other than to drugs and biological substances: Secondary | ICD-10-CM

## 2023-02-15 DIAGNOSIS — F0390 Unspecified dementia without behavioral disturbance: Secondary | ICD-10-CM | POA: Diagnosis present

## 2023-02-15 DIAGNOSIS — D72829 Elevated white blood cell count, unspecified: Secondary | ICD-10-CM | POA: Diagnosis present

## 2023-02-15 DIAGNOSIS — G8929 Other chronic pain: Secondary | ICD-10-CM | POA: Diagnosis present

## 2023-02-15 DIAGNOSIS — Z823 Family history of stroke: Secondary | ICD-10-CM | POA: Diagnosis not present

## 2023-02-15 DIAGNOSIS — M810 Age-related osteoporosis without current pathological fracture: Secondary | ICD-10-CM | POA: Diagnosis present

## 2023-02-15 DIAGNOSIS — M1990 Unspecified osteoarthritis, unspecified site: Secondary | ICD-10-CM | POA: Diagnosis present

## 2023-02-15 LAB — BASIC METABOLIC PANEL
Anion gap: 9 (ref 5–15)
BUN: 34 mg/dL — ABNORMAL HIGH (ref 8–23)
CO2: 18 mmol/L — ABNORMAL LOW (ref 22–32)
Calcium: 8.7 mg/dL — ABNORMAL LOW (ref 8.9–10.3)
Chloride: 111 mmol/L (ref 98–111)
Creatinine, Ser: 1.79 mg/dL — ABNORMAL HIGH (ref 0.44–1.00)
GFR, Estimated: 27 mL/min — ABNORMAL LOW (ref >60)
Glucose, Bld: 99 mg/dL (ref 70–99)
Potassium: 5.1 mmol/L (ref 3.5–5.1)
Sodium: 138 mmol/L (ref 135–145)

## 2023-02-15 LAB — CBC
HCT: 35.7 % — ABNORMAL LOW (ref 36.0–46.0)
Hemoglobin: 11.2 g/dL — ABNORMAL LOW (ref 12.0–15.0)
MCH: 31.9 pg (ref 26.0–34.0)
MCHC: 31.4 g/dL (ref 30.0–36.0)
MCV: 101.7 fL — ABNORMAL HIGH (ref 80.0–100.0)
Platelets: 288 10*3/uL (ref 150–400)
RBC: 3.51 MIL/uL — ABNORMAL LOW (ref 3.87–5.11)
RDW: 13.1 % (ref 11.5–15.5)
WBC: 16.5 10*3/uL — ABNORMAL HIGH (ref 4.0–10.5)
nRBC: 0 % (ref 0.0–0.2)

## 2023-02-15 MED ORDER — ACETAMINOPHEN 650 MG RE SUPP: 650.0000 mg | Freq: Four times a day (QID) | RECTAL | Status: DC | PRN

## 2023-02-15 MED ORDER — MORPHINE SULFATE (PF) 2 MG/ML IV SOLN: 2.0000 mg | INTRAVENOUS | Status: DC | PRN

## 2023-02-15 MED ORDER — ESCITALOPRAM OXALATE 10 MG PO TABS
20.0000 mg | ORAL_TABLET | Freq: Every day | ORAL | Status: DC
  Administered 2023-02-18 – 2023-02-22 (×4): 20 mg via ORAL
  Filled 2023-02-15 (×4): qty 2

## 2023-02-15 MED ORDER — OLANZAPINE 2.5 MG PO TABS: 2.5000 mg | ORAL_TABLET | Freq: Two times a day (BID) | ORAL | Status: DC | PRN

## 2023-02-15 MED ORDER — ONDANSETRON HCL 4 MG PO TABS: 4.0000 mg | ORAL_TABLET | Freq: Four times a day (QID) | ORAL | Status: DC | PRN

## 2023-02-15 MED ORDER — ONDANSETRON HCL 4 MG/2ML IJ SOLN
4.0000 mg | Freq: Once | INTRAMUSCULAR | Status: AC
  Administered 2023-02-15: 4 mg via INTRAVENOUS
  Filled 2023-02-15: qty 2

## 2023-02-15 MED ORDER — DONEPEZIL HCL 5 MG PO TABS
5.0000 mg | ORAL_TABLET | Freq: Every day | ORAL | Status: DC
  Administered 2023-02-15 – 2023-02-21 (×6): 5 mg via ORAL
  Filled 2023-02-15 (×7): qty 1

## 2023-02-15 MED ORDER — CHLORTHALIDONE 15 MG PO TABS: 15.0000 mg | ORAL_TABLET | ORAL | Status: DC

## 2023-02-15 MED ORDER — LOSARTAN POTASSIUM 50 MG PO TABS: 25.0000 mg | ORAL_TABLET | Freq: Every day | ORAL | Status: DC

## 2023-02-15 MED ORDER — CETIRIZINE HCL 10 MG PO TABS
10.0000 mg | ORAL_TABLET | Freq: Every evening | ORAL | Status: DC
  Administered 2023-02-16 – 2023-02-21 (×4): 10 mg via ORAL
  Filled 2023-02-15 (×7): qty 1

## 2023-02-15 MED ORDER — ACETAMINOPHEN 325 MG PO TABS: 650.0000 mg | ORAL_TABLET | Freq: Four times a day (QID) | ORAL | Status: DC | PRN

## 2023-02-15 MED ORDER — MORPHINE SULFATE (PF) 4 MG/ML IV SOLN
4.0000 mg | Freq: Once | INTRAVENOUS | Status: AC
  Administered 2023-02-15: 4 mg via INTRAVENOUS
  Filled 2023-02-15: qty 1

## 2023-02-15 MED ORDER — DIAZEPAM 2 MG PO TABS: 1.0000 mg | ORAL_TABLET | Freq: Every day | ORAL | Status: DC | PRN

## 2023-02-15 MED ORDER — ONDANSETRON HCL 4 MG/2ML IJ SOLN: 4.0000 mg | Freq: Four times a day (QID) | INTRAMUSCULAR | Status: DC | PRN

## 2023-02-15 MED ORDER — TIMOLOL MALEATE 0.25 % OP SOLN
1.0000 [drp] | Freq: Two times a day (BID) | OPHTHALMIC | Status: DC
  Administered 2023-02-15 – 2023-02-22 (×14): 1 [drp] via OPHTHALMIC
  Filled 2023-02-15: qty 5

## 2023-02-15 MED ORDER — AMLODIPINE BESYLATE 5 MG PO TABS: 5.0000 mg | ORAL_TABLET | Freq: Every day | ORAL | Status: DC

## 2023-02-15 MED ORDER — LEVOTHYROXINE SODIUM 50 MCG PO TABS
50.0000 ug | ORAL_TABLET | Freq: Every day | ORAL | Status: DC
  Administered 2023-02-16 – 2023-02-22 (×7): 50 ug via ORAL
  Filled 2023-02-15 (×7): qty 1

## 2023-02-15 MED ORDER — GABAPENTIN 100 MG PO CAPS: 100.0000 mg | ORAL_CAPSULE | Freq: Two times a day (BID) | ORAL | Status: DC | PRN

## 2023-02-15 MED ORDER — OXYCODONE HCL 5 MG PO TABS
5.0000 mg | ORAL_TABLET | ORAL | Status: DC | PRN
  Administered 2023-02-15 – 2023-02-17 (×4): 5 mg via ORAL
  Filled 2023-02-15 (×5): qty 1

## 2023-02-15 NOTE — ED Triage Notes (Signed)
Pt here from ome with c/o fall with right side hip pain , 4 morphine given for pain , no blood thinners, no loc

## 2023-02-15 NOTE — ED Provider Notes (Signed)
[place] EMERGENCY DEPARTMENT AT [facility] Provider Note   CSN: [id number] Arrival date & time: 02/15/23  1715     History  Chief Complaint  Patient presents with   Laura Ellis is a 89 y.o. female.  Pt s/p fall. Indicates mechanical fall, no faintness or dizziness. C/o right hip pain post fall. No headache. No neck or back pain. Denies chest pain or sob. No abd pain or nv. No other extremity pain or injury. Skin intact. No anticoagulant use.   The history is provided by the patient, medical records and the EMS personnel.       Home Medications Prior to Admission medications   Medication Sig Start Date End Date Taking? Authorizing Provider  B Complex-C-Folic Acid (DIALYVITE 800) 0.8 MG TABS Take 1 tablet by mouth daily. 01/22/23  Yes [provider]  diazepam (VALIUM) 2 MG tablet Take 1-2 mg by mouth daily as needed. 10/22/22  Yes [provider]  amLODipine (NORVASC) 5 MG tablet TAKE ONE TABLET BY MOUTH EVERY DAY 09/19/22   Everrett Coombe, DO  Ascorbic Acid (VITAMIN C) 1000 MG tablet Take 1,000 mg by mouth daily.    [provider]  aspirin EC 81 MG tablet Take 1 tablet (81 mg total) by mouth daily. 02/03/19   Emilie Rutter, PA-C  Coenzyme Q10 (COQ10) 100 MG CAPS Take 100 mg by mouth daily.    [provider]  Control Gel Formula Dressing (DUODERM CGF EXTRA THIN) MISC Apply every 72 hours 05/21/22   Everrett Coombe, DO  Cyanocobalamin (VITAMIN B-12 PO) Take 1 tablet by mouth every other day.    [provider]  donepezil (ARICEPT) 5 MG tablet Take 5 mg by mouth at bedtime. 09/28/20   [provider]  escitalopram (LEXAPRO) 20 MG tablet Take 1 tablet (20 mg total) by mouth daily. 06/25/22 06/20/23  Everrett Coombe, DO  famotidine (PEPCID) 20 MG tablet Take by mouth in the morning and at bedtime.    [provider]  levocetirizine (XYZAL) 5 MG tablet Take 5 mg by mouth every evening. 01/07/23   [provider]  levothyroxine (SYNTHROID) 50 MCG tablet TAKE ONE TABLET BY MOUTH EVERY DAY 06/25/22   Everrett Coombe, DO  losartan (COZAAR) 25 MG tablet TAKE ONE TABLET BY MOUTH EVERY DAY 06/14/22   Everrett Coombe, DO  Multiple Vitamin (MULTIVITAMIN WITH MINERALS) TABS tablet Take 1 tablet by mouth daily.    [provider]  OLANZapine (ZYPREXA) 2.5 MG tablet Take 2.5 mg by mouth 2 (two) times daily as needed. 01/08/22   [provider]  Red Yeast Rice 600 MG CAPS Take 600 mg by mouth daily.    [provider]  THALITONE 15 MG tablet Take 1 tablet (15 mg total) by mouth 2 (two) times a week. Tuesdays and Fridays once a day. Patient taking differently: Take 15 mg by mouth 2 (two) times a week. 08/31/22   Everrett Coombe, DO  timolol (TIMOPTIC) 0.25 % ophthalmic solution Place 1 drop into both eyes 2 (two) times daily. 10/02/18   [provider]      Allergies    Alendronate sodium, Ace inhibitors, Ezetimibe, Fenofibrate, Other, Rosuvastatin calcium, and Statins    Review of Systems   Review of Systems  Constitutional:  Negative for chills and fever.  HENT:  Negative for nosebleeds.   Eyes:  Negative for redness.  Respiratory:  Negative for shortness of breath.   Cardiovascular:  Negative for chest pain.  Gastrointestinal:  Negative for abdominal pain and vomiting.  Genitourinary:  Negative for dysuria and flank pain.  Musculoskeletal:  Negative for back pain and neck pain.  Skin:  Negative for wound.  Neurological:  Negative for weakness, numbness and headaches.    Physical Exam Updated Vital Signs BP (!) 128/59   Pulse 65   Temp 97.7 F (36.5 C) (Oral)   Resp 14   SpO2 97%  Physical Exam Vitals and nursing note reviewed.  Constitutional:      Appearance: Normal appearance. She is well-developed.  HENT:     Head: Atraumatic.     Nose: Nose normal.     Mouth/Throat:     Mouth: Mucous membranes are moist.  Eyes:     General: No scleral  icterus.    Conjunctiva/sclera: Conjunctivae normal.     Pupils: Pupils are equal, round, and reactive to light.  Neck:     Vascular: No carotid bruit.     Trachea: No tracheal deviation.  Cardiovascular:     Rate and Rhythm: Normal rate and regular rhythm.     Pulses: Normal pulses.     Heart sounds: Normal heart sounds. No murmur heard.    No friction rub. No gallop.  Pulmonary:     Effort: Pulmonary effort is normal. No respiratory distress.     Breath sounds: Normal breath sounds.  Chest:     Chest wall: No tenderness.  Abdominal:     General: Bowel sounds are normal. There is no distension.     Palpations: Abdomen is soft. There is no mass.     Tenderness: There is no abdominal tenderness. There is no guarding.  Genitourinary:    Comments: No cva tenderness.  Musculoskeletal:        General: No swelling or tenderness.     Cervical back: Normal range of motion and neck supple. No rigidity or tenderness. No muscular tenderness.     Comments: CTLS spine, non tender, aligned, no step off. Shortening RLE. Right hip pain. Compartments of RLE soft, not tense, distal pulses palp. Otherwise good rom bil extremities without other pain or focal bony tenderness.   Skin:    General: Skin is warm and dry.     Findings: No rash.  Neurological:     Mental Status: She is alert.     Comments: Alert, speech normal. Motor/sens grossly intact bil. RLE nvi.   Psychiatric:        Mood and Affect: Mood normal.     ED Results / Procedures / Treatments   Labs (all labs ordered are listed, but only abnormal results are displayed) Results for orders placed or performed during the hospital encounter of 02/15/23  Basic metabolic panel  Result Value Ref Range   Sodium 138 135 - 145 mmol/L   Potassium 5.1 3.5 - 5.1 mmol/L   Chloride 111 98 - 111 mmol/L   CO2 18 (L) 22 - 32 mmol/L   Glucose, Bld 99 70 - 99 mg/dL   BUN 34 (H) 8 - 23 mg/dL   Creatinine, Ser 1.61 (H) 0.44 - 1.00 mg/dL   Calcium  8.7 (L) 8.9 - 10.3 mg/dL   GFR, Estimated 27 (L) >60 mL/min   Anion gap 9 5 - 15  CBC  Result Value Ref Range   WBC 16.5 (H) 4.0 - 10.5 K/uL   RBC 3.51 (L) 3.87 - 5.11 MIL/uL   Hemoglobin 11.2 (L) 12.0 - 15.0 g/dL  HCT 35.7 (L) 36.0 - 46.0 %   MCV 101.7 (H) 80.0 - 100.0 fL   MCH 31.9 26.0 - 34.0 pg   MCHC 31.4 30.0 - 36.0 g/dL   RDW 16.1 09.6 - 04.5 %   Platelets 288 150 - 400 K/uL   nRBC 0.0 0.0 - 0.2 %  Type and screen  Result Value Ref Range   ABO/RH(D) O POS    Antibody Screen NEG    Sample Expiration      02/18/2023,2359 Performed at [facility] Lab, [address], [place], [place] [address]     EKG EKG Interpretation Date/Time:  Friday February 15 2023 17:39:19 EDT Ventricular Rate:  58 PR Interval:  184 QRS Duration:  91 QT Interval:  461 QTC Calculation: 453 R Axis:   -73  Text Interpretation: Sinus rhythm Left anterior fascicular block Confirmed by Cathren Laine [phone]) on 02/15/2023 5:47:17 PM  Radiology No results found.  Procedures Procedures    Medications Ordered in ED Medications  morphine (PF) 4 MG/ML injection 4 mg (has no administration in time range)  morphine (PF) 4 MG/ML injection 4 mg (4 mg Intravenous Given 02/15/23 1801)  ondansetron (ZOFRAN) injection 4 mg (4 mg Intravenous Given 02/15/23 1802)    ED Course/ Medical Decision Making/ A&P                                 Medical Decision Making Problems Addressed: Closed right hip fracture, initial encounter [facility]): acute illness or injury with systemic symptoms that poses a threat to life or bodily functions Fall from slip, trip, or stumble, initial encounter: acute illness or injury with systemic symptoms that poses a threat to life or bodily functions Stage 4 chronic kidney disease (HCC): acute illness or injury with systemic symptoms that poses a threat to life or bodily functions  Amount and/or Complexity of Data Reviewed Labs: ordered. Radiology: ordered. ECG/medicine tests:  ordered. Discussion of management or test interpretation with external provider(s): Ortho/medicine  Risk Prescription drug management. Parenteral controlled substances. Decision regarding hospitalization.   Iv ns. Continuous pulse ox and cardiac monitoring. Labs ordered/sent. Imaging ordered.   Differential diagnosis includes hip fx, etc. Dispo decision including potential need for admission considered - will get labs and imaging and reassess.   Reviewed nursing notes and prior charts for additional history. External reports reviewed. Additional history from: EMS.   Cardiac monitor: sinus rhythm, rate 70.  Morphine iv, zofran iv.   Labs reviewed/interpreted by me - ckd c/w baseline. Hct 36.   Xrays reviewed/interpreted by me - right hip fx.   Pain improved.   Ortho consulted re hip fx. Discussed pt  w Dr Hulda Humphrey - will see on consult.   Pain returns, additional morphine iv. Pain improved. Distal pulses palp. No new c/o.   Medicine consulted for admission.            Final Clinical Impression(s) / ED Diagnoses Final diagnoses:  Fall from slip, trip, or stumble, initial encounter  Closed right hip fracture, initial encounter [facility])  Stage 4 chronic kidney disease (HCC)    Rx / DC Orders ED Discharge Orders     None         Cathren Laine, MD 02/15/23 2041

## 2023-02-15 NOTE — H&P (Signed)
History and Physical    Patient: Laura Ellis UEA:[id number] DOB: 09/09/1932 DOA: 02/15/2023 DOS: the patient was seen and examined on 02/15/2023 PCP: Patient, No Pcp Per  Patient coming from: {Point_of_Origin:26777}  Chief Complaint:  Chief Complaint  Patient presents with   Fall   HPI: Laura Ellis is a 89 y.o. female with medical history significant of ***  Review of Systems: {ROS_Text:26778} Past Medical History:  Diagnosis Date   Arthritis    Chronic low back pain 05/26/2015   CKD (chronic kidney disease) stage 4, GFR 15-29 ml/min (HCC) 02/23/2016   Essential hypertension 05/26/2015   Glaucoma    Hypertension    Hypothyroidism 07/05/2015   Osteopenia    Osteoporosis 05/26/2015   old Dexa reviewed, pt states doesn't want to be on the bisphosphonate therapy but is taking Ca and D    Spinal stenosis of lumbar region at multiple levels 03/13/2018   MRI [facility] orthopedics January 2019   Past Surgical History:  Procedure Laterality Date   ABDOMINAL AORTOGRAM W/LOWER EXTREMITY Left 12/29/2018   Procedure: ABDOMINAL AORTOGRAM W/LOWER EXTREMITY;  Surgeon: Maeola Harman, MD;  Location: [facility] INVASIVE CV LAB;  Service: Cardiovascular;  Laterality: Left;   ABDOMINAL HYSTERECTOMY     COLONOSCOPY  2015   [facility]   ENDOVENOUS ABLATION SAPHENOUS VEIN W/ LASER Right 12/27/2016   endovenous laser ablation right greater saphenous vein by Gretta Began MD   ESOPHAGOGASTRODUODENOSCOPY  2015   x2 or 3 times in [place]   ESOPHAGOGASTRODUODENOSCOPY (EGD) WITH ESOPHAGEAL DILATION N/A    KNEE ARTHROSCOPY     PERIPHERAL VASCULAR ATHERECTOMY  12/29/2018   Procedure: PERIPHERAL VASCULAR ATHERECTOMY;  Surgeon: Maeola Harman, MD;  Location: [facility] INVASIVE CV LAB;  Service: Cardiovascular;;  left SFA   PERIPHERAL VASCULAR BALLOON ANGIOPLASTY  12/29/2018   Procedure: PERIPHERAL VASCULAR BALLOON ANGIOPLASTY;  Surgeon: Maeola Harman, MD;   Location: [facility] INVASIVE CV LAB;  Service: Cardiovascular;;  Left SFA   VARICOSE VEIN SURGERY     Social History:  reports that she has quit smoking. Her smoking use included cigarettes. She has never used smokeless tobacco. She reports current alcohol use of about 1.0 standard drink of alcohol per week. She reports that she does not use drugs.  Allergies  Allergen Reactions   Alendronate Sodium     Esophagitis    Ace Inhibitors     Cough   Ezetimibe Other (See Comments)    Unknown rxn   Fenofibrate     Arthralgia    Other     Other reaction(s): dizziness   Rosuvastatin Calcium     Muscle aches   Statins Other (See Comments)    Muscle aches    Family History  Problem Relation Age of Onset   Stroke Mother    Lung cancer Father    Heart disease Sister    Hypertension Sister    Hypertension Daughter    Rheum arthritis Daughter    Fibromyalgia Daughter    Colon cancer Neg Hx    Esophageal cancer Neg Hx     Prior to Admission medications   Medication Sig Start Date End Date Taking? Authorizing Provider  amLODipine (NORVASC) 5 MG tablet TAKE ONE TABLET BY MOUTH EVERY DAY 09/19/22  Yes Everrett Coombe, DO  aspirin EC 81 MG tablet Take 1 tablet (81 mg total) by mouth daily. 02/03/19  Yes Emilie Rutter, PA-C  Coenzyme Q10 (COQ10) 100 MG CAPS Take 100 mg by mouth daily.  Yes [provider]  Control Gel Formula Dressing (DUODERM CGF EXTRA THIN) MISC Apply every 72 hours Patient taking differently: as needed (skin tears). Apply every 72 hours 05/21/22  Yes Everrett Coombe, DO  diazepam (VALIUM) 2 MG tablet Take 1-2 mg by mouth daily as needed. 10/22/22  Yes [provider]  donepezil (ARICEPT) 5 MG tablet Take 5 mg by mouth at bedtime. 09/28/20  Yes [provider]  escitalopram (LEXAPRO) 20 MG tablet Take 1 tablet (20 mg total) by mouth daily. 06/25/22 06/20/23 Yes Everrett Coombe, DO  gabapentin (NEURONTIN) 100 MG capsule Take 100 mg by mouth 2 (two) times daily  as needed (nerve pain).   Yes [provider]  levocetirizine (XYZAL) 5 MG tablet Take 5 mg by mouth every evening. 01/07/23  Yes [provider]  levothyroxine (SYNTHROID) 50 MCG tablet TAKE ONE TABLET BY MOUTH EVERY DAY 06/25/22  Yes Everrett Coombe, DO  losartan (COZAAR) 25 MG tablet TAKE ONE TABLET BY MOUTH EVERY DAY 06/14/22  Yes Everrett Coombe, DO  oxycodone (OXY-IR) 5 MG capsule Take 5 mg by mouth every 4 (four) hours as needed for pain.   Yes [provider]  THALITONE 15 MG tablet Take 1 tablet (15 mg total) by mouth 2 (two) times a week. Tuesdays and Fridays once a day. Patient taking differently: Take 15 mg by mouth 2 (two) times a week. 08/31/22  Yes Everrett Coombe, DO  timolol (TIMOPTIC) 0.25 % ophthalmic solution Place 1 drop into both eyes 2 (two) times daily. 10/02/18  Yes [provider]  Ascorbic Acid (VITAMIN C) 1000 MG tablet Take 1,000 mg by mouth daily. Patient not taking: Reported on 02/15/2023    [provider]  Cyanocobalamin (VITAMIN B-12 PO) Take 1 tablet by mouth every other day. Patient not taking: Reported on 02/15/2023    [provider]  famotidine (PEPCID) 20 MG tablet Take by mouth in the morning and at bedtime. Patient not taking: Reported on 02/15/2023    [provider]  OLANZapine (ZYPREXA) 2.5 MG tablet Take 2.5 mg by mouth 2 (two) times daily as needed. 01/08/22   [provider]    Physical Exam: Vitals:   02/15/23 1730 02/15/23 1930 02/15/23 1945 02/15/23 2000  BP: (!) 150/62 (!) 134/58 103/65 (!) 128/59  Pulse: (!) 58 72 68 65  Resp: 20 15 14 14   Temp: 97.7 F (36.5 C)     TempSrc: Oral     SpO2: 100% 100% 100% 97%   *** Data Reviewed: {Tip this will not be part of the note when signed- Document your independent interpretation of telemetry tracing, EKG, lab, Radiology test or any other diagnostic tests. Add any new diagnostic test ordered  today. (Optional):26781} {Results:26384}  Assessment and Plan: No notes have been filed under this hospital service. Service: Hospitalist     Advance Care Planning:   Code Status: Full Code ***  Consults: ***  Family Communication: ***  Severity of Illness: {Observation/Inpatient:21159}  AuthorLonia Blood, MD 02/15/2023 9:04 PM  For on call review [email or web address].

## 2023-02-15 NOTE — ED Notes (Addendum)
ED TO INPATIENT HANDOFF REPORT  ED Nurse Name and Phone #: Marcello Moores [phone]  S Name/Age/Gender Laura Ellis 89 y.o. female Room/Bed: 024C/024C  Code Status   Code Status: Full Code  Home/SNF/Other Home Patient oriented to: self Is this baseline? Yes   Triage Complete: Triage complete  Chief Complaint Right femoral fracture (HCC) [S72.91XA]  Triage Note Pt here from ome with c/o fall with right side hip pain , 4 morphine given for pain , no blood thinners, no loc    Allergies Allergies  Allergen Reactions   Alendronate Sodium     Esophagitis    Ace Inhibitors     Cough   Ezetimibe Other (See Comments)    Unknown rxn   Fenofibrate     Arthralgia    Other     Other reaction(s): dizziness   Rosuvastatin Calcium     Muscle aches   Statins Other (See Comments)    Muscle aches    Level of Care/Admitting Diagnosis ED Disposition     ED Disposition  Admit   Condition  --   Comment  Hospital Area: [facility] [[id number]]  Level of Care: Med-Surg [16]  May admit patient to [facility] or [facility] if equivalent level of care is available:: No  Covid Evaluation: Asymptomatic - no recent exposure (last 10 days) testing not required  Diagnosis: Right femoral fracture [facility]) [191478]  Admitting Physician: Rometta Emery [[id number]]  Attending Physician: Rometta Emery [[id number]]  Certification:: I certify this patient will need inpatient services for at least 2 midnights  Expected Medical Readiness: 02/19/2023          B Medical/Surgery History Past Medical History:  Diagnosis Date   Arthritis    Chronic low back pain 05/26/2015   CKD (chronic kidney disease) stage 4, GFR 15-29 ml/min (HCC) 02/23/2016   Essential hypertension 05/26/2015   Glaucoma    Hypertension    Hypothyroidism 07/05/2015   Osteopenia    Osteoporosis 05/26/2015   old Dexa reviewed, pt states doesn't want to be on the bisphosphonate therapy but is taking Ca and D     Spinal stenosis of lumbar region at multiple levels 03/13/2018   MRI [facility] orthopedics January 2019   Past Surgical History:  Procedure Laterality Date   ABDOMINAL AORTOGRAM W/LOWER EXTREMITY Left 12/29/2018   Procedure: ABDOMINAL AORTOGRAM W/LOWER EXTREMITY;  Surgeon: Maeola Harman, MD;  Location: [facility] INVASIVE CV LAB;  Service: Cardiovascular;  Laterality: Left;   ABDOMINAL HYSTERECTOMY     COLONOSCOPY  2015   [facility]   ENDOVENOUS ABLATION SAPHENOUS VEIN W/ LASER Right 12/27/2016   endovenous laser ablation right greater saphenous vein by Gretta Began MD   ESOPHAGOGASTRODUODENOSCOPY  2015   x2 or 3 times in [place]   ESOPHAGOGASTRODUODENOSCOPY (EGD) WITH ESOPHAGEAL DILATION N/A    KNEE ARTHROSCOPY     PERIPHERAL VASCULAR ATHERECTOMY  12/29/2018   Procedure: PERIPHERAL VASCULAR ATHERECTOMY;  Surgeon: Maeola Harman, MD;  Location: [facility] INVASIVE CV LAB;  Service: Cardiovascular;;  left SFA   PERIPHERAL VASCULAR BALLOON ANGIOPLASTY  12/29/2018   Procedure: PERIPHERAL VASCULAR BALLOON ANGIOPLASTY;  Surgeon: Maeola Harman, MD;  Location: [facility] INVASIVE CV LAB;  Service: Cardiovascular;;  Left SFA   VARICOSE VEIN SURGERY       A IV Location/Drains/Wounds Patient Lines/Drains/Airways Status     Active Line/Drains/Airways     Name Placement date Placement time Site Days   Peripheral IV 02/15/23 22 G Anterior;Left Hand  02/15/23  1717  Hand  less than 1            Intake/Output Last 24 hours No intake or output data in the 24 hours ending 02/15/23 2127  Labs/Imaging Results for orders placed or performed during the hospital encounter of 02/15/23 (from the past 48 hour(s))  Basic metabolic panel     Status: Abnormal   Collection Time: 02/15/23  5:30 PM  Result Value Ref Range   Sodium 138 135 - 145 mmol/L   Potassium 5.1 3.5 - 5.1 mmol/L   Chloride 111 98 - 111 mmol/L   CO2 18 (L) 22 - 32 mmol/L   Glucose, Bld 99 70 -  99 mg/dL    Comment: Glucose reference range applies only to samples taken after fasting for at least 8 hours.   BUN 34 (H) 8 - 23 mg/dL   Creatinine, Ser 6.44 (H) 0.44 - 1.00 mg/dL   Calcium 8.7 (L) 8.9 - 10.3 mg/dL   GFR, Estimated 27 (L) >60 mL/min    Comment: (NOTE) Calculated using the CKD-EPI Creatinine Equation (2021)    Anion gap 9 5 - 15    Comment: Performed at [facility], [address], [place], [place] [address]  CBC     Status: Abnormal   Collection Time: 02/15/23  5:30 PM  Result Value Ref Range   WBC 16.5 (H) 4.0 - 10.5 K/uL   RBC 3.51 (L) 3.87 - 5.11 MIL/uL   Hemoglobin 11.2 (L) 12.0 - 15.0 g/dL   HCT 25.9 (L) 56.3 - 87.5 %   MCV 101.7 (H) 80.0 - 100.0 fL   MCH 31.9 26.0 - 34.0 pg   MCHC 31.4 30.0 - 36.0 g/dL   RDW 64.3 32.9 - 51.8 %   Platelets 288 150 - 400 K/uL   nRBC 0.0 0.0 - 0.2 %    Comment: Performed at [facility], [address], [place], [place] [address]  Type and screen     Status: None   Collection Time: 02/15/23  6:12 PM  Result Value Ref Range   ABO/RH(D) O POS    Antibody Screen NEG    Sample Expiration      02/18/2023,2359 Performed at [facility], [address] [place], [place] [address]    DG Chest 1 View  Result Date: 02/15/2023 CLINICAL DATA:  Right hip fracture. Medical clearance for surgical intervention. EXAM: CHEST  1 VIEW COMPARISON:  None Available. FINDINGS: Lungs are clear. No pneumothorax or pleural effusion. Cardiac size within normal limits. Pulmonary vascularity is normal. No acute bone abnormality. IMPRESSION: 1. No active disease. Electronically Signed   By: Helyn Numbers M.D.   On: 02/15/2023 21:16   DG HIP UNILAT W OR W/O PELVIS 2-3 VIEWS RIGHT  Result Date: 02/15/2023 CLINICAL DATA:  Fall, right hip pain EXAM: DG HIP (WITH OR WITHOUT PELVIS) 2-3V RIGHT COMPARISON:  None Available. FINDINGS: There is an acute, comminuted, overriding intratrochanteric right hip fracture with varus angulation of the  major distal fracture fragment. No dislocation with the femoral head seated within the acetabulum. Superimposed mild bilateral degenerative hip arthritis. Pelvis and visualized left hip appear intact. Vascular calcifications noted. IMPRESSION: 1. Acute, comminuted, overriding intratrochanteric right hip fracture. Electronically Signed   By: Helyn Numbers M.D.   On: 02/15/2023 21:15    Pending Labs [employer] (From admission, onward)     Start     Ordered   Signed and Held  CBC  Tomorrow morning,   R        Signed and Held   Signed and Held  Comprehensive metabolic panel  Tomorrow morning,   R        Signed and Held            Vitals/Pain Today's Vitals   02/15/23 1730 02/15/23 1930 02/15/23 1945 02/15/23 2000  BP: (!) 150/62 (!) 134/58 103/65 (!) 128/59  Pulse: (!) 58 72 68 65  Resp: 20 15 14 14   Temp: 97.7 F (36.5 C)     TempSrc: Oral     SpO2: 100% 100% 100% 97%  PainSc:        Isolation Precautions No active isolations  Medications Medications  morphine (PF) 4 MG/ML injection 4 mg (4 mg Intravenous Given 02/15/23 1801)  ondansetron (ZOFRAN) injection 4 mg (4 mg Intravenous Given 02/15/23 1802)  morphine (PF) 4 MG/ML injection 4 mg (4 mg Intravenous Given 02/15/23 2043)    Mobility walks with device     Focused Assessments     R Recommendations: See Admitting Provider Note  Report given to:   Additional Notes:  Right hip fx

## 2023-02-15 NOTE — Care Plan (Signed)
Brief Care Note-Full consult note to be completed  Mrs. Omar is an 9F. Orthopedics consulted for a right intertrochanteric hip fracture after mechanical fall earlier today.  Plan: Patient will require right hip intramedullary nail tomorrow Admission to medial service-appreciate management and optimization NPO at midnight NWB RLE Ok for 1 dose of lovenox tonight for dvt ppx, hold any dvt ppx in the morning Pain control per medical team

## 2023-02-16 ENCOUNTER — Inpatient Hospital Stay (HOSPITAL_COMMUNITY): Payer: Medicare Other | Admitting: Anesthesiology

## 2023-02-16 ENCOUNTER — Encounter (HOSPITAL_COMMUNITY)
Admission: EM | Disposition: A | Discharge status: Skilled Nursing Facility | Payer: Self-pay | Source: Home / Self Care | Attending: Internal Medicine

## 2023-02-16 ENCOUNTER — Inpatient Hospital Stay (HOSPITAL_COMMUNITY): Payer: Medicare Other

## 2023-02-16 ENCOUNTER — Other Ambulatory Visit: Payer: Self-pay

## 2023-02-16 ENCOUNTER — Encounter (HOSPITAL_COMMUNITY): Payer: Self-pay | Admitting: Internal Medicine

## 2023-02-16 DIAGNOSIS — S72141A Displaced intertrochanteric fracture of right femur, initial encounter for closed fracture: Secondary | ICD-10-CM

## 2023-02-16 DIAGNOSIS — E039 Hypothyroidism, unspecified: Secondary | ICD-10-CM

## 2023-02-16 HISTORY — PX: INTRAMEDULLARY (IM) NAIL INTERTROCHANTERIC: SHX5875

## 2023-02-16 LAB — URINALYSIS, ROUTINE W REFLEX MICROSCOPIC
Bilirubin Urine: NEGATIVE
Glucose, UA: NEGATIVE mg/dL
Hgb urine dipstick: NEGATIVE
Ketones, ur: NEGATIVE mg/dL
Nitrite: NEGATIVE
Protein, ur: 30 mg/dL — AB
Specific Gravity, Urine: 1.013 (ref 1.005–1.030)
WBC, UA: >50 WBC/hpf (ref 0–5)
pH: 6 (ref 5.0–8.0)

## 2023-02-16 LAB — CBC
HCT: 32.8 % — ABNORMAL LOW (ref 36.0–46.0)
Hemoglobin: 10.4 g/dL — ABNORMAL LOW (ref 12.0–15.0)
MCH: 32.5 pg (ref 26.0–34.0)
MCHC: 31.7 g/dL (ref 30.0–36.0)
MCV: 102.5 fL — ABNORMAL HIGH (ref 80.0–100.0)
Platelets: 283 10*3/uL (ref 150–400)
RBC: 3.2 MIL/uL — ABNORMAL LOW (ref 3.87–5.11)
RDW: 13.2 % (ref 11.5–15.5)
WBC: 8.8 10*3/uL (ref 4.0–10.5)
nRBC: 0 % (ref 0.0–0.2)

## 2023-02-16 LAB — BASIC METABOLIC PANEL
Anion gap: 12 (ref 5–15)
Anion gap: 10 (ref 5–15)
BUN: 38 mg/dL — ABNORMAL HIGH (ref 8–23)
BUN: 38 mg/dL — ABNORMAL HIGH (ref 8–23)
CO2: 16 mmol/L — ABNORMAL LOW (ref 22–32)
CO2: 18 mmol/L — ABNORMAL LOW (ref 22–32)
Calcium: 8.7 mg/dL — ABNORMAL LOW (ref 8.9–10.3)
Calcium: 8.5 mg/dL — ABNORMAL LOW (ref 8.9–10.3)
Chloride: 109 mmol/L (ref 98–111)
Chloride: 111 mmol/L (ref 98–111)
Creatinine, Ser: 2.8 mg/dL — ABNORMAL HIGH (ref 0.44–1.00)
Creatinine, Ser: 3.09 mg/dL — ABNORMAL HIGH (ref 0.44–1.00)
GFR, Estimated: 16 mL/min — ABNORMAL LOW (ref >60)
GFR, Estimated: 14 mL/min — ABNORMAL LOW (ref >60)
Glucose, Bld: 88 mg/dL (ref 70–99)
Glucose, Bld: 124 mg/dL — ABNORMAL HIGH (ref 70–99)
Potassium: 5.3 mmol/L — ABNORMAL HIGH (ref 3.5–5.1)
Potassium: 5.7 mmol/L — ABNORMAL HIGH (ref 3.5–5.1)
Sodium: 137 mmol/L (ref 135–145)
Sodium: 139 mmol/L (ref 135–145)

## 2023-02-16 LAB — SURGICAL PCR SCREEN
MRSA, PCR: NEGATIVE
Staphylococcus aureus: POSITIVE — AB

## 2023-02-16 LAB — POTASSIUM: Potassium: 5.5 mmol/L — ABNORMAL HIGH (ref 3.5–5.1)

## 2023-02-16 SURGERY — FIXATION, FRACTURE, INTERTROCHANTERIC, WITH INTRAMEDULLARY ROD
Anesthesia: General | Laterality: Right

## 2023-02-16 MED ORDER — CHLORHEXIDINE GLUCONATE 0.12 % MT SOLN: 15.0000 mL | Freq: Once | OROMUCOSAL | Status: DC

## 2023-02-16 MED ORDER — FENTANYL CITRATE (PF) 250 MCG/5ML IJ SOLN
INTRAMUSCULAR | Status: DC | PRN
  Administered 2023-02-16: 25 ug via INTRAVENOUS

## 2023-02-16 MED ORDER — FENTANYL CITRATE (PF) 250 MCG/5ML IJ SOLN
INTRAMUSCULAR | Status: AC
  Filled 2023-02-16: qty 5

## 2023-02-16 MED ORDER — ROCURONIUM BROMIDE 10 MG/ML (PF) SYRINGE
PREFILLED_SYRINGE | INTRAVENOUS | Status: DC | PRN
Start: 2023-02-16 — End: 2023-02-16
  Administered 2023-02-16: 10 mg via INTRAVENOUS
  Administered 2023-02-16: 30 mg via INTRAVENOUS
  Administered 2023-02-16: 20 mg via INTRAVENOUS

## 2023-02-16 MED ORDER — PHENYLEPHRINE 80 MCG/ML (10ML) SYRINGE FOR IV PUSH (FOR BLOOD PRESSURE SUPPORT)
PREFILLED_SYRINGE | INTRAVENOUS | Status: DC | PRN
  Administered 2023-02-16: 160 ug via INTRAVENOUS
  Administered 2023-02-16: 240 ug via INTRAVENOUS
  Administered 2023-02-16: 160 ug via INTRAVENOUS
  Administered 2023-02-16 (×2): 240 ug via INTRAVENOUS
  Administered 2023-02-16: 160 ug via INTRAVENOUS
  Administered 2023-02-16: 240 ug via INTRAVENOUS

## 2023-02-16 MED ORDER — ONDANSETRON HCL 4 MG/2ML IJ SOLN
INTRAMUSCULAR | Status: DC | PRN
  Administered 2023-02-16: 4 mg via INTRAVENOUS

## 2023-02-16 MED ORDER — CALCIUM GLUCONATE-NACL 1-0.675 GM/50ML-% IV SOLN
1.0000 g | Freq: Once | INTRAVENOUS | Status: AC
  Administered 2023-02-16: 1000 mg via INTRAVENOUS
  Filled 2023-02-16: qty 50

## 2023-02-16 MED ORDER — ALBUMIN HUMAN 5 % IV SOLN
INTRAVENOUS | Status: DC | PRN
Start: 2023-02-16 — End: 2023-02-16

## 2023-02-16 MED ORDER — VASOPRESSIN 20 UNIT/ML IV SOLN
INTRAVENOUS | Status: DC | PRN
Start: 2023-02-16 — End: 2023-02-16
  Administered 2023-02-16: 1 [IU] via INTRAVENOUS

## 2023-02-16 MED ORDER — SODIUM CHLORIDE 0.9 % IV SOLN
1.0000 g | INTRAVENOUS | Status: AC
  Administered 2023-02-16 – 2023-02-18 (×3): 1 g via INTRAVENOUS
  Filled 2023-02-16 (×5): qty 10

## 2023-02-16 MED ORDER — LACTATED RINGERS IV SOLN: INTRAVENOUS | Status: DC

## 2023-02-16 MED ORDER — VASOPRESSIN 20 UNIT/ML IV SOLN
INTRAVENOUS | Status: AC
  Filled 2023-02-16: qty 1

## 2023-02-16 MED ORDER — SODIUM ZIRCONIUM CYCLOSILICATE 10 G PO PACK
10.0000 g | PACK | Freq: Once | ORAL | Status: AC
  Administered 2023-02-16: 10 g via ORAL
  Filled 2023-02-16: qty 1

## 2023-02-16 MED ORDER — ROCURONIUM BROMIDE 10 MG/ML (PF) SYRINGE
PREFILLED_SYRINGE | INTRAVENOUS | Status: AC
  Filled 2023-02-16: qty 10

## 2023-02-16 MED ORDER — LIDOCAINE 2% (20 MG/ML) 5 ML SYRINGE
INTRAMUSCULAR | Status: AC
  Filled 2023-02-16: qty 5

## 2023-02-16 MED ORDER — PHENYLEPHRINE 80 MCG/ML (10ML) SYRINGE FOR IV PUSH (FOR BLOOD PRESSURE SUPPORT)
PREFILLED_SYRINGE | INTRAVENOUS | Status: AC
  Filled 2023-02-16: qty 20

## 2023-02-16 MED ORDER — CEFAZOLIN SODIUM-DEXTROSE 1-4 GM/50ML-% IV SOLN
1.0000 g | Freq: Once | INTRAVENOUS | Status: AC
  Administered 2023-02-16: 1 g via INTRAVENOUS
  Filled 2023-02-16: qty 50

## 2023-02-16 MED ORDER — CHLORHEXIDINE GLUCONATE 0.12 % MT SOLN
OROMUCOSAL | Status: AC
  Filled 2023-02-16: qty 15

## 2023-02-16 MED ORDER — PROPOFOL 10 MG/ML IV BOLUS
INTRAVENOUS | Status: DC | PRN
  Administered 2023-02-16 (×2): 10 mg via INTRAVENOUS
  Administered 2023-02-16: 40 mg via INTRAVENOUS
  Administered 2023-02-16: 20 mg via INTRAVENOUS

## 2023-02-16 MED ORDER — MUPIROCIN 2 % EX OINT
1.0000 | TOPICAL_OINTMENT | Freq: Two times a day (BID) | CUTANEOUS | Status: AC
  Administered 2023-02-16 – 2023-02-21 (×10): 1 via NASAL
  Filled 2023-02-16 (×3): qty 22

## 2023-02-16 MED ORDER — ALBUMIN HUMAN 5 % IV SOLN
12.5000 g | Freq: Once | INTRAVENOUS | Status: AC
  Administered 2023-02-16: 12.5 g via INTRAVENOUS

## 2023-02-16 MED ORDER — PHENYLEPHRINE HCL-NACL 20-0.9 MG/250ML-% IV SOLN
INTRAVENOUS | Status: DC | PRN
Start: 2023-02-16 — End: 2023-02-16
  Administered 2023-02-16: 50 ug/min via INTRAVENOUS

## 2023-02-16 MED ORDER — LIDOCAINE HCL (CARDIAC) PF 100 MG/5ML IV SOSY
PREFILLED_SYRINGE | INTRAVENOUS | Status: DC | PRN
  Administered 2023-02-16: 20 mg via INTRAVENOUS

## 2023-02-16 MED ORDER — ONDANSETRON HCL 4 MG/2ML IJ SOLN
INTRAMUSCULAR | Status: AC
  Filled 2023-02-16: qty 2

## 2023-02-16 MED ORDER — SODIUM BICARBONATE 8.4 % IV SOLN
INTRAVENOUS | Status: DC
  Filled 2023-02-16 (×4): qty 1000

## 2023-02-16 MED ORDER — CHLORHEXIDINE GLUCONATE CLOTH 2 % EX PADS
6.0000 | MEDICATED_PAD | Freq: Every day | CUTANEOUS | Status: AC
  Administered 2023-02-17 – 2023-02-20 (×4): 6 via TOPICAL

## 2023-02-16 MED ORDER — SUGAMMADEX SODIUM 200 MG/2ML IV SOLN
INTRAVENOUS | Status: DC | PRN
Start: 2023-02-16 — End: 2023-02-16
  Administered 2023-02-16: 200 mg via INTRAVENOUS

## 2023-02-16 MED ORDER — ALBUTEROL SULFATE (2.5 MG/3ML) 0.083% IN NEBU
2.5000 mg | INHALATION_SOLUTION | Freq: Once | RESPIRATORY_TRACT | Status: AC
  Administered 2023-02-16: 2.5 mg via RESPIRATORY_TRACT
  Filled 2023-02-16: qty 3

## 2023-02-16 MED ORDER — ORAL CARE MOUTH RINSE: 15.0000 mL | Freq: Once | OROMUCOSAL | Status: DC

## 2023-02-16 MED ORDER — ALBUMIN HUMAN 5 % IV SOLN
INTRAVENOUS | Status: AC
  Filled 2023-02-16: qty 250

## 2023-02-16 MED ORDER — EPHEDRINE SULFATE-NACL 50-0.9 MG/10ML-% IV SOSY
PREFILLED_SYRINGE | INTRAVENOUS | Status: DC | PRN
Start: 2023-02-16 — End: 2023-02-16
  Administered 2023-02-16 (×2): 10 mg via INTRAVENOUS
  Administered 2023-02-16: 5 mg via INTRAVENOUS

## 2023-02-16 MED ORDER — CEFAZOLIN SODIUM-DEXTROSE 1-4 GM/50ML-% IV SOLN: 1.0000 g | Freq: Three times a day (TID) | INTRAVENOUS | Status: DC

## 2023-02-16 MED ORDER — 0.9 % SODIUM CHLORIDE (POUR BTL) OPTIME
TOPICAL | Status: DC | PRN
  Administered 2023-02-16: 1000 mL

## 2023-02-16 SURGICAL SUPPLY — 38 items
BAG COUNTER SPONGE SURGICOUNT (BAG) ×2 IMPLANT
BAG SPNG CNTER NS LX DISP (BAG) ×1
BIT DRILL INTERTAN LAG SCREW (BIT) IMPLANT
BIT DRILL LONG 4.0 (BIT) IMPLANT
COVER PERINEAL POST (MISCELLANEOUS) ×2 IMPLANT
COVER SURGICAL LIGHT HANDLE (MISCELLANEOUS) ×2 IMPLANT
DRAPE C-ARM 42X72 X-RAY (DRAPES) ×2 IMPLANT
DRAPE STERI IOBAN 125X83 (DRAPES) ×2 IMPLANT
DRILL BIT LONG 4.0 (BIT) ×1
DRSG TEGADERM 4X4.75 (GAUZE/BANDAGES/DRESSINGS) IMPLANT
DRSG XEROFORM 1X8 (GAUZE/BANDAGES/DRESSINGS) IMPLANT
DURAPREP 26ML APPLICATOR (WOUND CARE) ×2 IMPLANT
GAUZE SPONGE 4X4 12PLY STRL (GAUZE/BANDAGES/DRESSINGS) IMPLANT
GLOVE BIO SURGEON STRL SZ8 (GLOVE) IMPLANT
GLOVE BIOGEL PI IND STRL 8 (GLOVE) ×4 IMPLANT
GOWN STRL REUS W/ TWL LRG LVL3 (GOWN DISPOSABLE) ×2 IMPLANT
GOWN STRL REUS W/TWL LRG LVL3 (GOWN DISPOSABLE) ×2
GUIDE PIN 3.2X343 (PIN) ×2
GUIDE PIN 3.2X343MM (PIN) ×2
GUIDE ROD 3.0 (MISCELLANEOUS) ×1
KIT BASIN OR (CUSTOM PROCEDURE TRAY) ×2 IMPLANT
KIT TURNOVER KIT B (KITS) ×2 IMPLANT
NAIL INTERTAN 10X18 130D 10S (Nail) IMPLANT
NS IRRIG 1000ML POUR BTL (IV SOLUTION) ×2 IMPLANT
PACK GENERAL/GYN (CUSTOM PROCEDURE TRAY) ×2 IMPLANT
PAD ARMBOARD 7.5X6 YLW CONV (MISCELLANEOUS) ×4 IMPLANT
PIN GUIDE 3.2X343MM (PIN) IMPLANT
ROD GUIDE 3.0 (MISCELLANEOUS) IMPLANT
SCREW LAG COMPR KIT 95/90 (Screw) IMPLANT
SCREW TRIGEN LOW PROF 5.0X25 (Screw) IMPLANT
STAPLER VISISTAT 35W (STAPLE) ×2 IMPLANT
SUT VIC AB 0 CT1 27 (SUTURE) ×1
SUT VIC AB 0 CT1 27XBRD ANBCTR (SUTURE) IMPLANT
SUT VIC AB 2-0 CT1 27 (SUTURE) ×2
SUT VIC AB 2-0 CT1 TAPERPNT 27 (SUTURE) IMPLANT
SUT VIC AB 3-0 CT1 36 (SUTURE) IMPLANT
TOWEL GREEN STERILE (TOWEL DISPOSABLE) ×2 IMPLANT
WATER STERILE IRR 1000ML POUR (IV SOLUTION) ×2 IMPLANT

## 2023-02-16 NOTE — Anesthesia Preprocedure Evaluation (Addendum)
Anesthesia Evaluation  Patient identified by MRN, date of birth, ID band Patient confused    Reviewed: Allergy & Precautions, H&P , NPO status , Patient's Chart, lab work & pertinent test results  Airway Mallampati: II  TM Distance: >3 FB Neck ROM: Full    Dental no notable dental hx. (+) Teeth Intact, Dental Advisory Given   Pulmonary former smoker   Pulmonary exam normal breath sounds clear to auscultation       Cardiovascular hypertension, Pt. on medications + Peripheral Vascular Disease   Rhythm:Regular Rate:Normal     Neuro/Psych       Dementia negative neurological ROS     GI/Hepatic Neg liver ROS, hiatal hernia,GERD  Medicated,,  Endo/Other  Hypothyroidism    Renal/GU Renal InsufficiencyRenal disease  negative genitourinary   Musculoskeletal  (+) Arthritis , Osteoarthritis,    Abdominal   Peds  Hematology negative hematology ROS (+)   Anesthesia Other Findings   Reproductive/Obstetrics negative OB ROS                             Anesthesia Physical Anesthesia Plan  ASA: 3  Anesthesia Plan: Spinal   Post-op Pain Management: Ofirmev IV (intra-op)*   Induction: Intravenous  PONV Risk Score and Plan: 3 and Propofol infusion and Ondansetron  Airway Management Planned: Natural Airway and Simple Face Mask  Additional Equipment:   Intra-op Plan:   Post-operative Plan:   Informed Consent: I have reviewed the patients History and Physical, chart, labs and discussed the procedure including the risks, benefits and alternatives for the proposed anesthesia with the patient or authorized representative who has indicated his/her understanding and acceptance.     Dental advisory given  Plan Discussed with: CRNA  Anesthesia Plan Comments:        Anesthesia Quick Evaluation

## 2023-02-16 NOTE — Transfer of Care (Signed)
Immediate Anesthesia Transfer of Care Note  Patient: Laura Ellis  Procedure(s) Performed: INTRAMEDULLARY (IM) NAIL INTERTROCHANTERIC (Right)  Patient Location: PACU  Anesthesia Type:General  Level of Consciousness: awake  Airway & Oxygen Therapy: Patient Spontanous Breathing  Post-op Assessment: Report given to RN and Post -op Vital signs reviewed and stable  Post vital signs: Reviewed and stable  Last Vitals:  Vitals Value Taken Time  BP 111/42 02/16/23 1452  Temp    Pulse 79 02/16/23 1453  Resp 15 02/16/23 1453  SpO2 93 % 02/16/23 1453  Vitals shown include unfiled device data.  Last Pain:  Vitals:   02/16/23 1206  TempSrc: Axillary         Complications: No notable events documented.

## 2023-02-16 NOTE — Anesthesia Procedure Notes (Signed)
Procedure Name: Intubation Date/Time: 02/16/2023 1:13 PM  Performed by: Yolonda Kida, CRNAPre-anesthesia Checklist: Patient identified, Emergency Drugs available, Suction available and Patient being monitored Patient Re-evaluated:Patient Re-evaluated prior to induction Oxygen Delivery Method: Circle System Utilized Preoxygenation: Pre-oxygenation with 100% oxygen Induction Type: IV induction Ventilation: Mask ventilation without difficulty Laryngoscope Size: Mac and 3 Grade View: Grade I Tube type: Oral Number of attempts: 1 Airway Equipment and Method: Stylet Placement Confirmation: ETT inserted through vocal cords under direct vision, positive ETCO2 and breath sounds checked- equal and bilateral Secured at: 21 cm Tube secured with: Tape Dental Injury: Teeth and Oropharynx as per pre-operative assessment

## 2023-02-16 NOTE — Consult Note (Addendum)
ORTHOPAEDIC CONSULTATION  REQUESTING PHYSICIAN: Burnadette Pop, MD  Chief Complaint: right hip fracture  HPI: Laura Ellis is a 89 y.o. female with  with medical history significant of essential hypertension, hypothyroidism, osteoporosis, chronic kidney disease stage IV, chronic low back pain, osteoarthritis who presented to the ER after sustaining a fall at home.  Patient was brought in by her daughter.  She apparently had some type of dementia.  Also some gait abnormalities.  She usually shuffles when she walks.  She was reaching out for dress in the back of her door on wearing some slippers that were slippery.  She slipped and fell to the floor hitting her right side.  She did not hit her head.  The daughter and hide the noise and came to the room.  She found her on the floor with the right leg laterally rotated.  Patient brought to the ER where she was seen and evaluated she appears to have right femoral neck fracture.  Orthopedic consulted and patient being admitted to the medical service for possible repair tomorrow.  Pain is now controlled.   Past Medical History:  Diagnosis Date   Arthritis    Chronic low back pain 05/26/2015   CKD (chronic kidney disease) stage 4, GFR 15-29 ml/min (HCC) 02/23/2016   Essential hypertension 05/26/2015   Glaucoma    Hypertension    Hypothyroidism 07/05/2015   Osteopenia    Osteoporosis 05/26/2015   old Dexa reviewed, pt states doesn't want to be on the bisphosphonate therapy but is taking Ca and D    Spinal stenosis of lumbar region at multiple levels 03/13/2018   MRI [facility] orthopedics January 2019   Past Surgical History:  Procedure Laterality Date   ABDOMINAL AORTOGRAM W/LOWER EXTREMITY Left 12/29/2018   Procedure: ABDOMINAL AORTOGRAM W/LOWER EXTREMITY;  Surgeon: Maeola Harman, MD;  Location: [facility] INVASIVE CV LAB;  Service: Cardiovascular;  Laterality: Left;   ABDOMINAL HYSTERECTOMY     COLONOSCOPY  2015   [facility]  [place]   ENDOVENOUS ABLATION SAPHENOUS VEIN W/ LASER Right 12/27/2016   endovenous laser ablation right greater saphenous vein by Gretta Began MD   ESOPHAGOGASTRODUODENOSCOPY  2015   x2 or 3 times in [place]   ESOPHAGOGASTRODUODENOSCOPY (EGD) WITH ESOPHAGEAL DILATION N/A    KNEE ARTHROSCOPY     PERIPHERAL VASCULAR ATHERECTOMY  12/29/2018   Procedure: PERIPHERAL VASCULAR ATHERECTOMY;  Surgeon: Maeola Harman, MD;  Location: [facility] INVASIVE CV LAB;  Service: Cardiovascular;;  left SFA   PERIPHERAL VASCULAR BALLOON ANGIOPLASTY  12/29/2018   Procedure: PERIPHERAL VASCULAR BALLOON ANGIOPLASTY;  Surgeon: Maeola Harman, MD;  Location: [facility] INVASIVE CV LAB;  Service: Cardiovascular;;  Left SFA   VARICOSE VEIN SURGERY     Social History   Socioeconomic History   Marital status: Widowed    Spouse name: Not on file   Number of children: 3   Years of education: 14   Highest education level: Some college, no degree  Occupational History   Occupation: Scientist, research (physical sciences)    Comment: retired  Tobacco Use   Smoking status: Former    Types: Cigarettes   Smokeless tobacco: Never   Tobacco comments:    quit 2003  Vaping Use   Vaping status: Never Used  Substance and Sexual Activity   Alcohol use: Yes    Alcohol/week: 1.0 standard drink of alcohol    Types: 1 Glasses of wine per week    Comment: occasionally-rare   Drug use: No   Sexual  activity: Not Currently  Other Topics Concern   Not on file  Social History Narrative   Feeds all her animals- cats- rescue   Chores around the house   Watches TV   Coffee 2 cups in the am   Social Determinants of Health   Financial Resource Strain: Low Risk  (01/03/2022)   Received from [facility], [facility]   Overall Financial Resource Strain (CARDIA)    Difficulty of Paying Living Expenses: Not hard at all  Food Insecurity: No Food Insecurity (02/15/2023)   Hunger Vital Sign    Worried About Running Out of Food in  the Last Year: Never true    Ran Out of Food in the Last Year: Never true  Transportation Needs: No Transportation Needs (02/15/2023)   PRAPARE - Administrator, Civil Service (Medical): No    Lack of Transportation (Non-Medical): No  Physical Activity: Inactive (05/10/2020)   Exercise Vital Sign    Days of Exercise per Week: 0 days    Minutes of Exercise per Session: 0 min  Stress: No Stress Concern Present (01/03/2022)   Received from [facility], [facility] Occupational [facility] - Occupational Stress Questionnaire    Feeling of Stress : Only a little  Social Connections: Unknown (09/21/2021)   Received from [facility], [facility]   Social Network    Social Network: Not on file   Family History  Problem Relation Age of Onset   Stroke Mother    Lung cancer Father    Heart disease Sister    Hypertension Sister    Hypertension Daughter    Rheum arthritis Daughter    Fibromyalgia Daughter    Colon cancer Neg Hx    Esophageal cancer Neg Hx    Allergies  Allergen Reactions   Alendronate Sodium     Esophagitis    Ace Inhibitors     Cough   Ezetimibe Other (See Comments)    Unknown rxn   Fenofibrate     Arthralgia    Other     Other reaction(s): dizziness   Rosuvastatin Calcium     Muscle aches   Statins Other (See Comments)    Muscle aches   Prior to Admission medications   Medication Sig Start Date End Date Taking? Authorizing Provider  amLODipine (NORVASC) 5 MG tablet TAKE ONE TABLET BY MOUTH EVERY DAY 09/19/22  Yes Everrett Coombe, DO  aspirin EC 81 MG tablet Take 1 tablet (81 mg total) by mouth daily. 02/03/19  Yes Emilie Rutter, PA-C  Coenzyme Q10 (COQ10) 100 MG CAPS Take 100 mg by mouth daily.   Yes [provider]  Control Gel Formula Dressing (DUODERM CGF EXTRA THIN) MISC Apply every 72 hours Patient taking differently: as needed (skin tears). Apply every 72 hours 05/21/22  Yes Everrett Coombe, DO  diazepam  (VALIUM) 2 MG tablet Take 1-2 mg by mouth daily as needed. 10/22/22  Yes [provider]  donepezil (ARICEPT) 5 MG tablet Take 5 mg by mouth at bedtime. 09/28/20  Yes [provider]  escitalopram (LEXAPRO) 20 MG tablet Take 1 tablet (20 mg total) by mouth daily. 06/25/22 06/20/23 Yes Everrett Coombe, DO  gabapentin (NEURONTIN) 100 MG capsule Take 100 mg by mouth 2 (two) times daily as needed (nerve pain).   Yes [provider]  levocetirizine (XYZAL) 5 MG tablet Take 5 mg by mouth every evening. 01/07/23  Yes [provider]  levothyroxine (SYNTHROID) 50 MCG tablet  TAKE ONE TABLET BY MOUTH EVERY DAY 06/25/22  Yes Everrett Coombe, DO  losartan (COZAAR) 25 MG tablet TAKE ONE TABLET BY MOUTH EVERY DAY 06/14/22  Yes Everrett Coombe, DO  oxycodone (OXY-IR) 5 MG capsule Take 5 mg by mouth every 4 (four) hours as needed for pain.   Yes [provider]  THALITONE 15 MG tablet Take 1 tablet (15 mg total) by mouth 2 (two) times a week. Tuesdays and Fridays once a day. Patient taking differently: Take 15 mg by mouth 2 (two) times a week. 08/31/22  Yes Everrett Coombe, DO  timolol (TIMOPTIC) 0.25 % ophthalmic solution Place 1 drop into both eyes 2 (two) times daily. 10/02/18  Yes [provider]  Ascorbic Acid (VITAMIN C) 1000 MG tablet Take 1,000 mg by mouth daily. Patient not taking: Reported on 02/15/2023    [provider]  Cyanocobalamin (VITAMIN B-12 PO) Take 1 tablet by mouth every other day. Patient not taking: Reported on 02/15/2023    [provider]  famotidine (PEPCID) 20 MG tablet Take by mouth in the morning and at bedtime. Patient not taking: Reported on 02/15/2023    [provider]  OLANZapine (ZYPREXA) 2.5 MG tablet Take 2.5 mg by mouth 2 (two) times daily as needed. Patient not taking: Reported on 02/15/2023 01/08/22   [provider]    Family History Reviewed and non-contributory, no pertinent history of problems  with bleeding or anesthesia      Review of Systems 14 system ROS conducted and negative except for that noted in HPI   OBJECTIVE  Vitals:Patient Vitals for the past 8 hrs:  BP Temp Temp src Pulse Resp SpO2  02/16/23 0808 (!) 110/50 -- -- 73 17 91 %  02/16/23 0536 (!) 102/47 97.9 F (36.6 C) Oral 67 16 91 %   General: Alert and oriented to name only, no acute distress Cardiovascular: Warm extremities noted Respiratory: No cyanosis, no use of accessory musculature GI: No organomegaly, abdomen is soft and non-tender Skin: No lesions in the area of chief complaint other than those listed below in MSK exam.  Neurologic: Sensation intact distally save for the below mentioned MSK exam Psychiatric: Patient is not competent for consent as she is only oriented to name only Extremities  RLE: Skin intact Leg held shortened and externally rotated Pain with log roll and axial load TTP over hip Non tender rest of extremity Moving toes Sensation intact sp/dp/t 2+ dp pulse, foot wwp    Test Results Imaging DG Chest 1 View  Result Date: 02/15/2023 CLINICAL DATA:  Right hip fracture. Medical clearance for surgical intervention. EXAM: CHEST  1 VIEW COMPARISON:  None Available. FINDINGS: Lungs are clear. No pneumothorax or pleural effusion. Cardiac size within normal limits. Pulmonary vascularity is normal. No acute bone abnormality. IMPRESSION: 1. No active disease. Electronically Signed   By: Helyn Numbers M.D.   On: 02/15/2023 21:16   DG HIP UNILAT W OR W/O PELVIS 2-3 VIEWS RIGHT  Result Date: 02/15/2023 CLINICAL DATA:  Fall, right hip pain EXAM: DG HIP (WITH OR WITHOUT PELVIS) 2-3V RIGHT COMPARISON:  None Available. FINDINGS: There is an acute, comminuted, overriding intratrochanteric right hip fracture with varus angulation of the major distal fracture fragment. No dislocation with the femoral head seated within the acetabulum. Superimposed mild bilateral degenerative hip arthritis. Pelvis  and visualized left hip appear intact. Vascular calcifications noted. IMPRESSION: 1. Acute, comminuted, overriding intratrochanteric right hip fracture. Electronically Signed   By: Lyda Kalata.D.  On: 02/15/2023 21:15    Labs cbc Recent Labs    02/15/23 1730  WBC 16.5*  HGB 11.2*  HCT 35.7*  PLT 288    Labs inflam No results for input(s): "CRP" in the last 72 hours.  Invalid input(s): "ESR"  Labs coag No results for input(s): "INR", "PTT" in the last 72 hours.  Invalid input(s): "PT"  Recent Labs    02/15/23 1730  NA 138  K 5.1  CL 111  CO2 18*  GLUCOSE 99  BUN 34*  CREATININE 1.79*  CALCIUM 8.7*     ASSESSMENT AND PLAN: 89 y.o. female with the following: right intertrochanteric hip fracture  This patient requires inpatient admission to manage this problem appropriately.  Plan for right hip intramedullary nailing  Orthopedics recommends admission to a medical service and we will provide consultation and follow along  - Weight Bearing Status/Activity: NWB RLE  - Additional recommended labs/tests: NPO  -VTE Prophylaxis: Hold for OR  - Pain control: per medical team

## 2023-02-16 NOTE — Anesthesia Postprocedure Evaluation (Signed)
Anesthesia Post Note  Patient: Laura Ellis  Procedure(s) Performed: INTRAMEDULLARY (IM) NAIL INTERTROCHANTERIC (Right)     Patient location during evaluation: PACU Anesthesia Type: General Level of consciousness: confused Pain management: pain level controlled Vital Signs Assessment: post-procedure vital signs reviewed and stable Respiratory status: spontaneous breathing, nonlabored ventilation, respiratory function stable and patient connected to nasal cannula oxygen Cardiovascular status: blood pressure returned to baseline and stable Postop Assessment: no apparent nausea or vomiting Anesthetic complications: no  No notable events documented.  Last Vitals:  Vitals:   02/16/23 1545 02/16/23 1600  BP: (!) 107/49 (!) 107/58  Pulse: 75 82  Resp: 13 16  Temp:    SpO2: 98% 96%    Last Pain:  Vitals:   02/16/23 1206  TempSrc: Axillary                 Maddyx Vallie,W. EDMOND

## 2023-02-16 NOTE — Op Note (Signed)
[name]  Orthopaedic Surgery Operative Note (CSN: [id number])  Cherylann Ratel A Brim  13-Nov-1932 Date of Surgery: 02/16/2023   Diagnoses:  RIGHT INTERTROCH FRACTURE  Procedure: Right hip intramedullary nailing   Operative Finding Successful completion of the planned procedure.    Post-operative plan: The patient will be WBAT.  DVT prophylaxis Lovenox 40 mg/day until mobilizing and then consider transition in clinic to alternative medicines preferably aspirin 81mg  BID for 6 weeks post op.   Pain control with PRN pain medication preferring oral medicines.  Follow up plan will be scheduled in approximately 14 days for incision check and XR.  Post-Op Diagnosis: Same Surgeons:Primary: Laura Bank, MD Assistants:Caroline McBane PA-C Location: [facility] OR ROOM 05 Anesthesia: General Antibiotics: Ancef 1 g Tourniquet time: N/A Estimated Blood Loss: 150 cc Complications: None Specimens: None Implants: Implant Name Type Inv. Item Serial No. Manufacturer Lot No. LRB No. Used Action  NAIL INTERTAN 10X18 130D 10S - [id number] Nail NAIL INTERTAN 10X18 130D 10S  [employer] [id number] Right 1 Implanted  SCREW LAG COMPR KIT 95/90 - [id number] Screw SCREW LAG COMPR KIT 95/90  [employer] [id number] Right 1 Implanted  SCREW TRIGEN LOW PROF 5.0X25 - [id number] Screw SCREW TRIGEN LOW PROF 5.0X25  [employer] [id number] Right 1 Implanted    Indications for Surgery:   Laura Ellis is a 89 y.o. female with history of hypertension, hypothyroidism, osteoporosis, CKD stage IV, chronic back pain, dementia who presented with fall from home.  Imaging showed right intertrochanteric hip fracture .  Benefits and risks of operative and nonoperative management were discussed prior to surgery with patient/guardian(s) and informed consent form was completed.  Specific risks including bleeding, infection, need for additional surgery, need for assistive device, failure of implant,  failure to heal and anesthetic complications including stroke, myocardial infarction or even death.  Procedure:   The patient was met in the preoperative holding area and consent completed with her daughter and correct side marked. The patient was placed supine on a fracture table and appropriate reduction was obtained and visualized on fluoroscopy prior to the beginning of the procedure.  We made an incision proximal to the greater trochanter and dissected down through the fascia.  We then carefully placed our starting wire localizing under fluoroscopy prior to advancing the wire into the bone and sliding a ball-tipped guidewire through the awl into the femoral canal.  The wire was passed to an appropriate level past the isthmus.  We selected a length of nail noted above.  Entry reamer was used.  At this point we placed our nail localizing under fluoroscopy that it was at the appropriate level prior to using the outrigger device to pass a wire and then the cephalo-medullary screws.  The screws were locked proximally to avoid over collapse.  We took final shots at the proximal femur and then used the outrigger to place one distal interlock screw.  Final pictures were obtained.  The wounds were thoroughly irrigated closed in a multilayer fashion with absorable sutures.  A sterile dressing was placed.  The patient was awoken from general anesthesia and taken to the PACU in stable condition without complication.     Laura Ellis 02/16/2023, 3:02 PM Orthopaedic Surgery

## 2023-02-16 NOTE — Progress Notes (Signed)
Brief same day note:   Patient is a 89 year old female with history of hypertension, hypothyroidism, osteoporosis, CKD stage IV, chronic back pain, dementia who presented with fall from home.  Imaging showed right comminuted femoral neck fracture.  Orthopedics has been consulted.  Plan for ORIF.  Patient seen and examined the bedside this morning.  She is hemodynamically stable.  Lying in bed.  Confused.  Not in any kind of distress.  Kidney function has worsened today so started on bicarb drip.  Assessment and plan:  Right hip fracture: Presented with fall at home.  Imaging showed right comminuted femoral neck fracture.  Orthopedics already consulted and plan for surgery. PT/OT evaluation of surgery.  CKD stage 3 b-4: Baseline creatinine in the range of 1.8.  Kidney function worsened today with creatinine in the range of 2.8 this morning.  Started on IV fluids  Leukocytosis: Will check UA.  But this is  most likely reactive.  Already resolved  Hypertension: Currently blood pressure soft.  Losartan, metoprolol hold.  Continue to monitor  Hyperlipidemia: On  statin  Peripheral artery disease: On aspirin which is on hold for now.  Hypothyroidism: Continue Synthyroid  GERD: Continue PPI  Dementia: Remains confused.  Takes Aricept at home.  Continue delirium precautions, frequent reorientation

## 2023-02-16 NOTE — Plan of Care (Signed)

## 2023-02-16 NOTE — Plan of Care (Signed)
  Problem: Activity: Goal: Risk for activity intolerance will decrease Outcome: Progressing   Problem: Nutrition: Goal: Adequate nutrition will be maintained Outcome: Progressing   Problem: Coping: Goal: Level of anxiety will decrease Outcome: Progressing   

## 2023-02-16 NOTE — Brief Op Note (Signed)
02/16/2023  2:49 PM  PATIENT:  Laura Ellis  89 y.o. female  PRE-OPERATIVE DIAGNOSIS:  RIGHT INTERTROCH FRACTURE  POST-OPERATIVE DIAGNOSIS:  RIGHT INTERTROCH FRACTURE  PROCEDURE:  Procedure(s): INTRAMEDULLARY (IM) NAIL INTERTROCHANTERIC (Right)  SURGEON:  Surgeons and Role:    * Eliud Polo, Leta Baptist, MD - Primary  ASSISTANTS: none   ANESTHESIA:   general  EBL:  150 cc   BLOOD ADMINISTERED:none  DRAINS: none   LOCAL MEDICATIONS USED:  NONE  SPECIMEN:  No Specimen  DISPOSITION OF SPECIMEN:  N/A  COUNTS:  YES  TOURNIQUET:  * No tourniquets in log *  DICTATION: .Note written in EPIC  PLAN OF CARE: Admit to inpatient   PATIENT DISPOSITION:  PACU - hemodynamically stable.   Delay start of Pharmacological VTE agent (>24hrs) due to surgical blood loss or risk of bleeding: not applicable

## 2023-02-17 ENCOUNTER — Inpatient Hospital Stay (HOSPITAL_COMMUNITY): Payer: Medicare Other

## 2023-02-17 DIAGNOSIS — Z66 Do not resuscitate: Secondary | ICD-10-CM | POA: Diagnosis not present

## 2023-02-17 DIAGNOSIS — Z515 Encounter for palliative care: Secondary | ICD-10-CM

## 2023-02-17 DIAGNOSIS — S72351A Displaced comminuted fracture of shaft of right femur, initial encounter for closed fracture: Secondary | ICD-10-CM | POA: Diagnosis not present

## 2023-02-17 DIAGNOSIS — Z7189 Other specified counseling: Secondary | ICD-10-CM | POA: Diagnosis not present

## 2023-02-17 LAB — BASIC METABOLIC PANEL
Anion gap: 9 (ref 5–15)
BUN: 39 mg/dL — ABNORMAL HIGH (ref 8–23)
CO2: 23 mmol/L (ref 22–32)
Calcium: 8 mg/dL — ABNORMAL LOW (ref 8.9–10.3)
Chloride: 104 mmol/L (ref 98–111)
Creatinine, Ser: 3.39 mg/dL — ABNORMAL HIGH (ref 0.44–1.00)
GFR, Estimated: 13 mL/min — ABNORMAL LOW (ref >60)
Glucose, Bld: 131 mg/dL — ABNORMAL HIGH (ref 70–99)
Potassium: 4.4 mmol/L (ref 3.5–5.1)
Sodium: 136 mmol/L (ref 135–145)

## 2023-02-17 LAB — CBC
HCT: 19.7 % — ABNORMAL LOW (ref 36.0–46.0)
Hemoglobin: 6.4 g/dL — CL (ref 12.0–15.0)
MCH: 32.7 pg (ref 26.0–34.0)
MCHC: 32.5 g/dL (ref 30.0–36.0)
MCV: 100.5 fL — ABNORMAL HIGH (ref 80.0–100.0)
Platelets: 169 10*3/uL (ref 150–400)
RBC: 1.96 MIL/uL — ABNORMAL LOW (ref 3.87–5.11)
RDW: 13.2 % (ref 11.5–15.5)
WBC: 8.8 10*3/uL (ref 4.0–10.5)
nRBC: 0 % (ref 0.0–0.2)

## 2023-02-17 LAB — SODIUM, URINE, RANDOM: Sodium, Ur: 27 mmol/L

## 2023-02-17 LAB — ABO/RH: ABO/RH(D): O POS

## 2023-02-17 LAB — HEMOGLOBIN AND HEMATOCRIT, BLOOD
HCT: 25.5 % — ABNORMAL LOW (ref 36.0–46.0)
Hemoglobin: 8.5 g/dL — ABNORMAL LOW (ref 12.0–15.0)

## 2023-02-17 LAB — PREPARE RBC (CROSSMATCH)

## 2023-02-17 MED ORDER — HYDROMORPHONE HCL 1 MG/ML IJ SOLN: 0.5000 mg | INTRAMUSCULAR | Status: DC | PRN

## 2023-02-17 MED ORDER — MIDODRINE HCL 5 MG PO TABS
5.0000 mg | ORAL_TABLET | Freq: Three times a day (TID) | ORAL | Status: DC
  Administered 2023-02-18 – 2023-02-19 (×3): 5 mg via ORAL
  Filled 2023-02-17 (×4): qty 1

## 2023-02-17 MED ORDER — SODIUM CHLORIDE 0.9% IV SOLUTION: Freq: Once | INTRAVENOUS | Status: DC

## 2023-02-17 MED ORDER — ACETAMINOPHEN 500 MG PO TABS
1000.0000 mg | ORAL_TABLET | Freq: Three times a day (TID) | ORAL | Status: DC
  Administered 2023-02-17 – 2023-02-20 (×9): 1000 mg via ORAL
  Filled 2023-02-17 (×13): qty 2

## 2023-02-17 MED ORDER — SODIUM CHLORIDE 0.9 % IV SOLN: INTRAVENOUS | Status: DC

## 2023-02-17 NOTE — Progress Notes (Signed)
PROGRESS NOTE  Laura Ellis  ZOX:[id number] DOB: [birth year] DOA: 02/15/2023 PCP: Patient, No Pcp Per   Brief Narrative: Patient is a 89 year old female with history of hypertension, hypothyroidism, osteoporosis, CKD stage IV, chronic back pain, dementia who presented with fall from home.  Imaging showed right comminuted femoral neck fracture.  Orthopedics  consulted,S/P ORIF.  Hospital course remarkable for acute blood loss anemia, AKI on CKD   Assessment & Plan:  Principal Problem:   Right femoral fracture (HCC) Active Problems:   Osteoporosis   Essential hypertension   Hypothyroidism   CKD (chronic kidney disease) stage 4, GFR 15-29 ml/min (HCC)   PAD (peripheral artery disease) (HCC)   Gastroesophageal reflux disease without esophagitis   Hyperlipidemia, unspecified   Dementia (HCC)   Right hip fracture: Presented with fall at home.  Imaging showed right comminuted femoral neck fracture.  Status post right hip intramedullary nailing.  PT/OT evaluation pending  CKD stage 3b-4: Baseline creatinine in the range of 1.8.  Kidney function worsened today with creatinine in the range of 3.  This is most likely secondary to hemodynamic changes.  Patient was hypotensive yesterday.  Also has acute blood loss anemia from hip fracture.  Continue IV fluids.  Foley has been placed.  Monitor input and output.  Will check ultrasound of the kidneys, urine sodium. Non-anion gap metabolic acidosis has resolved. She follows [facility]  Leukocytosis: Presented with leukocytosis.  Unclear if she has dysuria because of dementia.  Plan for 3 days course of ceftriaxone  Acute blood loss anemia: From surgery, fracture.  Hemoglobin dropped from 10.4 to 6.4.  Being transfused a unit of PRBC today  Hypertension: Currently blood pressure soft.  Losartan, metoprolol hold.  Continue to monitor  Hyperlipidemia: On  statin  Peripheral artery disease: On aspirin which is on hold for  now.  Hypothyroidism: Continue Synthyroid  GERD: Continue PPI  Dementia: Remains confused.  She is HOH .Takes Aricept at home.  Continue delirium precautions, frequent reorientation  Goals of care: Elderly female with dementia now with hip fracture, progressive renal failure.  Palliative care consulted for goals of care.  She remains full code.  Prognosis guarded. She lives with her daughters.  Daughter not sure if she is a full code or DNR.  She will get back to [place].          DVT prophylaxis:SCDs Start: 02/15/23 2207     Code Status: Full Code  Family Communication:: Discussed with daughter [name]  on phone on 10/5  Patient status:Inpatient  Patient is from :Home  Anticipated discharge to:SNF  Estimated DC date:not sure   Consultants: Orthopedics  Procedures:ORIF  Antimicrobials:  Anti-infectives (From admission, onward)    Start     Dose/Rate Route Frequency Ordered Stop   02/16/23 1800  ceFAZolin (ANCEF) IVPB 1 g/50 mL premix  Status:  Discontinued        1 g 100 mL/hr over 30 Minutes Intravenous Every 8 hours 02/16/23 1511 02/16/23 1511   02/16/23 1600  cefTRIAXone (ROCEPHIN) 1 g in sodium chloride 0.9 % 100 mL IVPB        1 g 200 mL/hr over 30 Minutes Intravenous Every 24 hours 02/16/23 1244 02/19/23 1559   02/16/23 1400  ceFAZolin (ANCEF) IVPB 1 g/50 mL premix        1 g 100 mL/hr over 30 Minutes Intravenous  Once 02/16/23 1345 02/16/23 1421       Subjective: Patient seen and examined the bedside today.  Hemodynamically stable.  She lies on bed.  Very confused but not agitated.  Does not speak.  Not in acute distress.  Foley draining clear urine.  Objective: Vitals:   02/17/23 0619 02/17/23 0807 02/17/23 1000 02/17/23 1102  BP: (!) 109/59 100/80 101/61 (!) (P) 92/55  Pulse: 94 95  89  Resp: 16 19  (P) 16  Temp: 98.3 F (36.8 C) 98.6 F (37 C)  98.3 F (36.8 C)  TempSrc: Axillary Oral  Oral  SpO2: 99% 96%  100%  Weight:      Height:         Intake/Output Summary (Last 24 hours) at 02/17/2023 1111 Last data filed at 02/17/2023 0300 Gross per 24 hour  Intake 1445.73 ml  Output 150 ml  Net 1295.73 ml   Filed Weights   02/16/23 1220  Weight: 57.2 kg    Examination:  General exam: Overall comfortable, confused, lying in bed, very deconditioned HEENT: PERRL Respiratory system:  no wheezes or crackles  Cardiovascular system: S1 & S2 heard, RRR.  Gastrointestinal system: Abdomen is nondistended, soft and nontender. Central nervous system: Alert and awake but not oriented Extremities: Clean surgical wound on the right hip, no edema Skin: No rashes, no ulcers,no icterus  GU: Foley   Data Reviewed: I have personally reviewed following labs and imaging studies  CBC: Recent Labs  Lab 02/15/23 1730 02/16/23 0823 02/17/23 0753  WBC 16.5* 8.8 8.8  HGB 11.2* 10.4* 6.4*  HCT 35.7* 32.8* 19.7*  MCV 101.7* 102.5* 100.5*  PLT 288 283 169   Basic Metabolic Panel: Recent Labs  Lab 02/15/23 1730 02/16/23 0823 02/16/23 1708 02/16/23 1951 02/17/23 0753  NA 138 137 139  --  136  K 5.1 5.3* 5.7* 5.5* 4.4  CL 111 109 111  --  104  CO2 18* 16* 18*  --  23  GLUCOSE 99 88 124*  --  131*  BUN 34* 38* 38*  --  39*  CREATININE 1.79* 2.80* 3.09*  --  3.39*  CALCIUM 8.7* 8.7* 8.5*  --  8.0*     Recent Results (from the past 240 hour(s))  Surgical PCR screen     Status: Abnormal   Collection Time: 02/16/23  5:46 AM   Specimen: Nasal Mucosa; Nasal Swab  Result Value Ref Range Status   MRSA, PCR NEGATIVE NEGATIVE Final   Staphylococcus aureus POSITIVE (A) NEGATIVE Final    Comment: (NOTE) The Xpert SA Assay (FDA approved for NASAL specimens in patients 8 years of age and older), is one component of a comprehensive surveillance program. It is not intended to diagnose infection nor to guide or monitor treatment. Performed at [facility], [address] [place], [place] [address]      Radiology Studies: DG  HIP UNILAT WITH PELVIS 2-3 VIEWS RIGHT  Result Date: 02/16/2023 CLINICAL DATA:  Elective surgery. EXAM: DG HIP (WITH OR WITHOUT PELVIS) 2-3V RIGHT COMPARISON:  Preoperative imaging FINDINGS: Three fluoroscopic spot views of the right hip obtained in the operating room. Femoral intramedullary nail with trans trochanteric and distal locking screw fixation traverse proximal femur fracture. Fluoroscopy time 2 minutes 5 seconds. Dose 17.4 mGy. IMPRESSION: Intraoperative fluoroscopy during right hip ORIF. Electronically Signed   By: Narda Rutherford M.D.   On: 02/16/2023 14:54   DG C-Arm 1-60 Min-No Report  Result Date: 02/16/2023 Fluoroscopy was utilized by the requesting physician.  No radiographic interpretation.   DG C-Arm 1-60 Min-No Report  Result Date: 02/16/2023 Fluoroscopy was  utilized by the requesting physician.  No radiographic interpretation.   DG Chest 1 View  Result Date: 02/15/2023 CLINICAL DATA:  Right hip fracture. Medical clearance for surgical intervention. EXAM: CHEST  1 VIEW COMPARISON:  None Available. FINDINGS: Lungs are clear. No pneumothorax or pleural effusion. Cardiac size within normal limits. Pulmonary vascularity is normal. No acute bone abnormality. IMPRESSION: 1. No active disease. Electronically Signed   By: Helyn Numbers M.D.   On: 02/15/2023 21:16   DG HIP UNILAT W OR W/O PELVIS 2-3 VIEWS RIGHT  Result Date: 02/15/2023 CLINICAL DATA:  Fall, right hip pain EXAM: DG HIP (WITH OR WITHOUT PELVIS) 2-3V RIGHT COMPARISON:  None Available. FINDINGS: There is an acute, comminuted, overriding intratrochanteric right hip fracture with varus angulation of the major distal fracture fragment. No dislocation with the femoral head seated within the acetabulum. Superimposed mild bilateral degenerative hip arthritis. Pelvis and visualized left hip appear intact. Vascular calcifications noted. IMPRESSION: 1. Acute, comminuted, overriding intratrochanteric right hip fracture.  Electronically Signed   By: Helyn Numbers M.D.   On: 02/15/2023 21:15    Scheduled Meds:  sodium chloride   Intravenous Once   cetirizine  10 mg Oral QPM   Chlorhexidine Gluconate Cloth  6 each Topical Q0600   donepezil  5 mg Oral QHS   escitalopram  20 mg Oral Daily   levothyroxine  50 mcg Oral Daily   mupirocin ointment  1 Application Nasal BID   timolol  1 drop Both Eyes BID   Continuous Infusions:  sodium chloride     cefTRIAXone (ROCEPHIN)  IV 1 g (02/16/23 1721)     LOS: 2 days   Burnadette Pop, MD [facility] P10/10/2022, 11:11 AM

## 2023-02-17 NOTE — Consult Note (Addendum)
Palliative Medicine Inpatient Consult Note  Consulting Provider:  Burnadette Pop, MD   Reason for consult:   Palliative Care Consult Services Palliative Medicine Consult  Reason for Consult? Came wiht right hip fracture. has advanced dementia. Very deconditoned. Progressive renal failure   02/17/2023  HPI:  Per intake H&P --> Patient is a 89 year old female with history of hypertension, hypothyroidism, osteoporosis, CKD stage IV, chronic back pain, dementia who presented with fall from home.  Imaging showed right comminuted femoral neck fracture.  Orthopedics  consulted,S/P ORIF.  Hospital course remarkable for acute blood loss anemia, AKI on CKD.  Palliative care asked to get involved to assist with goals of care conversations.   Clinical Assessment/Goals of Care:  *Please note that this is a verbal dictation therefore any spelling or grammatical errors are due to the "Dragon Medical One" system interpretation.  I have reviewed medical records including EPIC notes, labs and imaging, received report from bedside RN, assessed the patient who is lying in bed mumbling when spoken to.    I met with patients daughter, [name] to further discuss diagnosis prognosis, GOC, EOL wishes, disposition and options.   I introduced Palliative Medicine as specialized medical care for people living with serious illness. It focuses on providing relief from the symptoms and stress of a serious illness. The goal is to improve quality of life for both the patient and the family.  Medical History Review and Understanding:  I reviewed Annalicia' past medical history of HTN, hypothyroidism, OP, CKDIV, back pain, and dementia.   Social History:  Shardea lives in [place], [place]. She is a widow. She has two daughters. She has four grandchildren though one passed of an overdose. 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 Her daughter shares that she use to volunteer for the [employer]eral [employer].  She is a  woman who loves her independence. She is [place].  Functional and Nutritional State:  Patient is being cared for by her daughters [name] and Marcelino Duster alternating. She had been mobilizing without a walker prior to her fall due to her being stubborn.   Palliative Symptoms:  Pain - Chronic back pain. Acute hip pain.   Advance Directives:  A detailed discussion was had today regarding advanced directives. Advanced Directives are in [place].    Code Status:  Concepts specific to code status, artifical feeding and hydration, continued IV antibiotics and rehospitalization was had.  The difference between a aggressive medical intervention path  and a palliative comfort care path for this patient at this time was had.   Arletta is a DNAR/DNI code status.   Discussion:  We discussed Kazzandra' underlying chronic disease burden in the setting of her kidney disease and dementia. [name] shares that she has vascular dementia and has carried this diagnosis for roughly two years. We reviewed the mortality statistics for hip fractures in the elderly.   [name] is very aware that Joyceann may improve though she also may worsen. She and I discussed allowing time for outcomes though if Clifton neglects to thrive the consideration of hospice care.   Discussed the importance of continued conversation with family and their  medical providers regarding overall plan of care and treatment options, ensuring decisions are within the context of the patients values and GOCs.  Provided  "Hard Choices for [employer]" booklet.   Decision Maker: Carolyn Stare (Daughter): [phone] ([place])   SUMMARY OF RECOMMENDATIONS   DNAR/DNI  AD's in [place]  Allow time for outcomes though if [facility]  should worsen or neglect to improve patients daughter is open to hospice care  Ongoing PMT support  Symptoms as below  Code Status/Advance Care Planning: DNAR/DNI   Symptom Management:  Pain Acute on Chronic: - Tylenol 1G TID -  Oxycodone 5mg  PO Q4H PRN - DC Morphine d/t AKI on CKD - Start dilaudid 0.5mg  Q3H PRN severe pain  Encephalopathy: - Strict delirium precautions  Palliative Prophylaxis:  Aspiration, Bowel Regimen, Delirium Protocol, Frequent Pain Assessment, Oral Care, Palliative Wound Care, and Turn Reposition  Additional Recommendations (Limitations, Scope, Preferences): Continue current care  Psycho-social/Spiritual:  Desire for further Chaplaincy support: Yes - Protestant Additional Recommendations: Education on dementia progression.    Prognosis: High chronic disease burden, new hip fx, increase 12 month mortality risk.   Discharge Planning: Discharge will be to skilled nursing.   Vitals:   02/17/23 1102 02/17/23 1118  BP: (!) 92/55 93/62  Pulse: 89 81  Resp: 16 16  Temp: 98.3 F (36.8 C) 98.1 F (36.7 C)  SpO2: 100% 96%    Intake/Output Summary (Last 24 hours) at 02/17/2023 1224 Last data filed at 02/17/2023 1115 Gross per 24 hour  Intake 1445.73 ml  Output 275 ml  Net 1170.73 ml   Last Weight  Most recent update: 02/16/2023 12:21 PM    Weight  57.2 kg (126 lb)            Gen:  Frail elderly Caucasian F in NAD HEENT: Dry mucous membranes CV: Regular rate and rhythm  PULM: On 3LPM [place], breathing is even and nonlabored ABD: soft/nontender  EXT: No edema  Neuro: Disoriented  PPS: 10%   This conversation/these recommendations were discussed with patient primary care team, Dr. Renford Dills  Billing based on MDM: High  Problems Addressed: One acute or chronic illness or injury that poses a threat to life or bodily function  Amount and/or Complexity of Data: Category 3:Discussion of management or test interpretation with external physician/other qualified health care professional/appropriate source (not separately reported)  Risks: Decision regarding hospitalization or escalation of hospital care ______________________________________________________ Lamarr Lulas [facility]  Health Palliative Medicine Team Team Cell Phone: [phone] Please utilize secure chat with additional questions, if there is no response within 30 minutes please call the above phone number  Palliative Medicine Team providers are available by phone from 7am to 7pm daily and can be reached through the team cell phone.  Should this patient require assistance outside of these hours, please call the patient's attending physician.

## 2023-02-17 NOTE — Progress Notes (Signed)
.  Subjective: 1 Day Post-Op Procedure(s) (LRB): INTRAMEDULLARY (IM) NAIL INTERTROCHANTERIC (Right)  Resting in bed. Baseline mental status. Transfused 1u pRBCs for Hgb 6.4.  Activity level:  WBAT Diet tolerance:  as tolerated   Objective: Vital signs in last 24 hours: Temp:  [97.9 F (36.6 C)-98.6 F (37 C)] 98.1 F (36.7 C) (10/06 1118) Pulse Rate:  [74-95] 81 (10/06 1118) Resp:  [13-19] 16 (10/06 1118) BP: (91-121)/(38-87) 93/62 (10/06 1118) SpO2:  [89 %-100 %] 96 % (10/06 1118)  Labs: Recent Labs    02/15/23 1730 02/16/23 0823 02/17/23 0753  HGB 11.2* 10.4* 6.4*   Recent Labs    02/16/23 0823 02/17/23 0753  WBC 8.8 8.8  RBC 3.20* 1.96*  HCT 32.8* 19.7*  PLT 283 169   Recent Labs    02/16/23 1708 02/16/23 1951 02/17/23 0753  NA 139  --  136  K 5.7* 5.5* 4.4  CL 111  --  104  CO2 18*  --  23  BUN 38*  --  39*  CREATININE 3.09*  --  3.39*  GLUCOSE 124*  --  131*  CALCIUM 8.5*  --  8.0*   No results for input(s): "LABPT", "INR" in the last 72 hours.  Physical Exam:  Neurovascular intact Sensation intact distally Intact pulses distally Dorsiflexion/Plantar flexion intact Incision: dressing C/D/I  Assessment/Plan:  1 Day Post-Op Procedure(s) (LRB): INTRAMEDULLARY (IM) NAIL INTERTROCHANTERIC (Right)  Hgb down to 6.4 from 10.4 post op, likely due to acute blood loss anemia from fracture/surgery. Continue to trend CBC and transfuse as necessary WBAT PT/OT Pain control as needed Likely discharge planning to SNF   Luci Bank 02/17/2023, 1:04 PM

## 2023-02-17 NOTE — Plan of Care (Signed)
  Problem: Activity: Goal: Risk for activity intolerance will decrease Outcome: Progressing   Problem: Coping: Goal: Level of anxiety will decrease Outcome: Progressing   Problem: Pain Managment: Goal: General experience of comfort will improve Outcome: Progressing   

## 2023-02-18 ENCOUNTER — Encounter (HOSPITAL_COMMUNITY): Payer: Self-pay | Admitting: Orthopedic Surgery

## 2023-02-18 DIAGNOSIS — Z515 Encounter for palliative care: Secondary | ICD-10-CM | POA: Diagnosis not present

## 2023-02-18 DIAGNOSIS — Z7189 Other specified counseling: Secondary | ICD-10-CM | POA: Diagnosis not present

## 2023-02-18 DIAGNOSIS — S72351A Displaced comminuted fracture of shaft of right femur, initial encounter for closed fracture: Secondary | ICD-10-CM | POA: Diagnosis not present

## 2023-02-18 LAB — IRON AND TIBC
Iron: 11 ug/dL — ABNORMAL LOW (ref 28–170)
Saturation Ratios: 7 % — ABNORMAL LOW (ref 10.4–31.8)
TIBC: 162 ug/dL — ABNORMAL LOW (ref 250–450)
UIBC: 151 ug/dL

## 2023-02-18 LAB — CBC
HCT: 26.5 % — ABNORMAL LOW (ref 36.0–46.0)
Hemoglobin: 8.4 g/dL — ABNORMAL LOW (ref 12.0–15.0)
MCH: 30.5 pg (ref 26.0–34.0)
MCHC: 31.7 g/dL (ref 30.0–36.0)
MCV: 96.4 fL (ref 80.0–100.0)
Platelets: 133 10*3/uL — ABNORMAL LOW (ref 150–400)
RBC: 2.75 MIL/uL — ABNORMAL LOW (ref 3.87–5.11)
RDW: 14.6 % (ref 11.5–15.5)
WBC: 9.3 10*3/uL (ref 4.0–10.5)
nRBC: 0 % (ref 0.0–0.2)

## 2023-02-18 LAB — TYPE AND SCREEN
ABO/RH(D): O POS
Antibody Screen: NEGATIVE
Unit division: 0

## 2023-02-18 LAB — BASIC METABOLIC PANEL
Anion gap: 11 (ref 5–15)
BUN: 37 mg/dL — ABNORMAL HIGH (ref 8–23)
CO2: 20 mmol/L — ABNORMAL LOW (ref 22–32)
Calcium: 7.7 mg/dL — ABNORMAL LOW (ref 8.9–10.3)
Chloride: 107 mmol/L (ref 98–111)
Creatinine, Ser: 3.22 mg/dL — ABNORMAL HIGH (ref 0.44–1.00)
GFR, Estimated: 13 mL/min — ABNORMAL LOW (ref >60)
Glucose, Bld: 100 mg/dL — ABNORMAL HIGH (ref 70–99)
Potassium: 4 mmol/L (ref 3.5–5.1)
Sodium: 138 mmol/L (ref 135–145)

## 2023-02-18 LAB — BPAM RBC
Blood Product Expiration Date: 202411012359
ISSUE DATE / TIME: 202410061058
Unit Type and Rh: 5100

## 2023-02-18 LAB — FERRITIN: Ferritin: 186 ng/mL (ref 11–307)

## 2023-02-18 NOTE — Progress Notes (Signed)
Subjective: 2 Days Post-Op Procedure(s) (LRB): INTRAMEDULLARY (IM) NAIL INTERTROCHANTERIC (Right)   Resting in bed. Baseline mental status. Transfused 1u pRBCs for Hgb 6.4 yesterday. Hgb this morning 8.4   Activity level:  WBAT Diet tolerance:  as tolerated   Objective: Vital signs in last 24 hours: Temp:  [97.9 F (36.6 C)-99.1 F (37.3 C)] 98 F (36.7 C) (10/07 0735) Pulse Rate:  [79-107] 84 (10/07 0735) Resp:  [16-18] 18 (10/07 0735) BP: (92-143)/(52-68) 143/68 (10/07 0735) SpO2:  [93 %-100 %] 99 % (10/07 0735)  Labs: Recent Labs    02/15/23 1730 02/16/23 0823 02/17/23 0753 02/17/23 1516 02/18/23 0634  HGB 11.2* 10.4* 6.4* 8.5* 8.4*   Recent Labs    02/17/23 0753 02/17/23 1516 02/18/23 0634  WBC 8.8  --  9.3  RBC 1.96*  --  2.75*  HCT 19.7* 25.5* 26.5*  PLT 169  --  133*   Recent Labs    02/17/23 0753 02/18/23 0634  NA 136 138  K 4.4 4.0  CL 104 107  CO2 23 20*  BUN 39* 37*  CREATININE 3.39* 3.22*  GLUCOSE 131* 100*  CALCIUM 8.0* 7.7*   No results for input(s): "LABPT", "INR" in the last 72 hours.  Physical Exam:  Neurovascular intact Sensation intact distally Intact pulses distally Dorsiflexion/Plantar flexion intact Incision: dressing C/D/I  Assessment/Plan:  2 Days Post-Op Procedure(s) (LRB): INTRAMEDULLARY (IM) NAIL INTERTROCHANTERIC (Right)  Hgb down to at 8.4 s/p transfusiion. Anemia is likely due to acute blood loss anemia from fracture/surgery. Continue to trend CBC and transfuse as necessary WBAT PT/OT Pain control as needed Likely discharge planning to SNF   Luci Bank 02/18/2023, 9:08 AM

## 2023-02-18 NOTE — Evaluation (Signed)
Occupational Therapy Evaluation Patient Details Name: Laura Ellis MRN: [id number] DOB: January 23, 1933 Today's Date: 02/18/2023   History of Present Illness Patient is a 89 year old female with history of hypertension, hypothyroidism, osteoporosis, CKD stage IV, chronic back pain, dementia who presented to [facility] 10/4 with fall from home.  Imaging showed right comminuted femoral neck fracture.  Orthopedics  consulted,S/P ORIF 02/16/23.  Hospital course remarkable for acute blood loss anemia, AKI on CKD.  Palliative care, nephrology consulted. PT/OT not consulted until 02/18/23.   Clinical Impression   Patient admitted for the diagnosis above.  Patient lives in her two story farm house with her daughter.  She has another daughter that also assists, but does not live there.  Patient continues to bathe and dress herself with supervision and cues, walks with HHA from her daughter and PCA assist.  Currently pain is limiting her ability to practice standing and use of a RW, but given her PLOF, Patient will benefit from continued inpatient follow up therapy, <3 hours/day prior to returning home.  OT will follow in the acute setting to address deficits.          If plan is discharge home, recommend the following: Assist for transportation;Assistance with cooking/housework    Functional Status Assessment  Patient has had a recent decline in their functional status and demonstrates the ability to make significant improvements in function in a reasonable and predictable amount of time.  Equipment Recommendations  None recommended by OT    Recommendations for Other Services       Precautions / Restrictions Precautions Precautions: Fall Precaution Comments: Watch O2 Restrictions Weight Bearing Restrictions: No      Mobility Bed Mobility Overal bed mobility: Needs Assistance Bed Mobility: Rolling, Supine to Sit, Sit to Supine Rolling: Total assist   Supine to sit: Total assist Sit to  supine: Total assist   General bed mobility comments: patient not assisting with movements. Patient Response: Cooperative  Transfers                   General transfer comment: deferred OOB to recliner      Balance Overall balance assessment: Needs assistance Sitting-balance support: Single extremity supported, Feet supported Sitting balance-Leahy Scale: Poor   Postural control: Posterior lean                                 ADL either performed or assessed with clinical judgement   ADL Overall ADL's : Needs assistance/impaired Eating/Feeding: Sitting;Set up Eating/Feeding Details (indicate cue type and reason): grasp cup and take sips of ensure Grooming: Wash/dry hands;Wash/dry face;Contact guard assist   Upper Body Bathing: Moderate assistance;Sitting   Lower Body Bathing: Total assistance;Bed level   Upper Body Dressing : Moderate assistance;Sitting   Lower Body Dressing: Total assistance;Bed level   Toilet Transfer: Maximal assistance;Squat-pivot;BSC/3in1                   Vision   Vision Assessment?: No apparent visual deficits     Perception Perception: Not tested       Praxis Praxis: Not tested       Pertinent Vitals/Pain Pain Assessment Pain Assessment: Faces Faces Pain Scale: Hurts whole lot Pain Location: R LE, but grimaces also with movement of L LE Pain Descriptors / Indicators: Discomfort, Grimacing, Guarding Pain Intervention(s): Monitored during session, Premedicated before session     Extremity/Trunk Assessment Upper Extremity Assessment Upper Extremity Assessment:  Generalized weakness   Lower Extremity Assessment Lower Extremity Assessment: Defer to PT evaluation RLE: Unable to fully assess due to pain LLE: Unable to fully assess due to pain   Cervical / Trunk Assessment Cervical / Trunk Assessment: Normal   Communication Communication Communication: Hearing impairment;Difficulty communicating  thoughts/reduced clarity of speech Cueing Techniques: Verbal cues;Gestural cues   Cognition Arousal: Alert Behavior During Therapy: Flat affect Overall Cognitive Status: History of cognitive impairments - at baseline                                 General Comments: history of dementia with aphasia.     General Comments   VSS on O2    Exercises     Shoulder Instructions      Home Living Family/patient expects to be discharged to:: Private residence Living Arrangements: Children Available Help at Discharge: Family;Available 24 hours/day Type of Home: House Home Access: Stairs to enter [employer] of Steps: 3 Entrance Stairs-Rails: Right Home Layout: Two level;Bed/bath upstairs Alternate Level Stairs-Number of Steps: 14 stairs to patient's bed/bath.  Old farm house. Alternate Level Stairs-Rails: Right Bathroom Shower/Tub: Producer, television/film/video: Standard Bathroom Accessibility: Yes How Accessible: Accessible via walker Home Equipment: Shower seat;Grab bars - tub/shower;Hand held shower head   Additional Comments: patient unable to provide home information due to dementia and very HOH, no family present at bedside.      Prior Functioning/Environment Prior Level of Function : Needs assist             Mobility Comments: Patient generally calls for help for mobility to the bathroom.  HHA.  Patient normally sits in front of the TV in her upstairs room. ADLs Comments: Patient able to complete bathing and dressing with constant supervision from daughter's.  Daughters assist with iADL, medications, meals and community mobility.        OT Problem List: Decreased range of motion;Impaired balance (sitting and/or standing);Increased edema;Pain;Decreased safety awareness;Decreased cognition      OT Treatment/Interventions: Self-care/ADL training;Therapeutic activities;Balance training;DME and/or AE instruction    OT Goals(Current goals can  be found in the care plan section) Acute Rehab OT Goals Patient Stated Goal: Return home per daughter OT Goal Formulation: With patient/family Time For Goal Achievement: 03/04/23 Potential to Achieve Goals: Fair ADL Goals Pt Will Perform Eating: with set-up;sitting Pt Will Perform Grooming: with set-up;sitting Pt Will Perform Upper Body Dressing: with min assist;sitting Pt Will Transfer to Toilet: with mod assist;stand pivot transfer;bedside commode  OT Frequency: Min 1X/week    Co-evaluation              AM-PAC OT "6 Clicks" Daily Activity     Outcome Measure Help from another person eating meals?: A Little Help from another person taking care of personal grooming?: A Little Help from another person toileting, which includes using toliet, bedpan, or urinal?: A Lot Help from another person bathing (including washing, rinsing, drying)?: A Lot Help from another person to put on and taking off regular upper body clothing?: A Lot Help from another person to put on and taking off regular lower body clothing?: Total 6 Click Score: 13   End of Session Nurse Communication: Mobility status  Activity Tolerance: Patient limited by pain Patient left: in bed  OT Visit Diagnosis: Unsteadiness on feet (R26.81);Pain;Other symptoms and signs involving cognitive function Pain - Right/Left: Right Pain - part of body: Leg  Time: 1914-7829 OT Time Calculation (min): 22 min Charges:  OT General Charges $OT Visit: 1 Visit OT Evaluation $OT Eval Moderate Complexity: 1 Mod  02/18/2023  RP, OTR/L  Acute Rehabilitation Services  Office:  [phone]   Suzanna Obey 02/18/2023, 4:46 PM

## 2023-02-18 NOTE — NC FL2 (Signed)
[place] MEDICAID FL2 LEVEL OF CARE FORM     IDENTIFICATION  Patient Name: Laura Ellis Birthdate: 1932/06/13 Sex: female Admission Date (Current Location): 02/15/2023  [facility] and [place] Number:  Producer, television/film/video and Address:  [facility] [place], [address], [place], [place] [address]      Provider Number: [id number]  Attending Physician Name and Address:  Burnadette Pop, MD  Relative Name and Phone Number:  Carolyn Stare Daughter [phone]    Current Level of Care: Hospital Recommended Level of Care: Skilled Nursing Facility Prior Approval Number:    Date Approved/Denied:   PASRR Number: [id number]  Discharge Plan: SNF    Current Diagnoses: Patient Active Problem List   Diagnosis Date Noted   Right femoral fracture (HCC) 02/15/2023   Acute midline low back pain without sciatica 05/21/2022   Pressure ulcer of left ankle, stage 2 (HCC) 05/21/2022   Aphasia 04/02/2021   Dementia (HCC) 04/02/2021   Right shoulder pain 04/02/2021   Elevated vitamin B12 level 03/24/2019   High serum vitamin B12 02/18/2019   PAD (peripheral artery disease) (HCC) 12/04/2018   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   Schatzki's ring of distal esophagus 11/26/2018   Hiatal hernia 11/26/2018   Spinal stenosis of lumbar region at multiple levels 03/13/2018   Advance directive discussed with patient 01/16/2018   Chronic venous insufficiency 09/26/2017   Elevated blood pressure reading with diagnosis of hypertension 09/26/2017   Renal cyst, left 07/18/2017   Chronic pain syndrome 05/25/2017   Degenerative scoliosis 05/15/2017   Degenerative spondylolisthesis 05/15/2017   Neuropathic pain 05/15/2017   Orthostatic dizziness 03/07/2017   CKD (chronic kidney disease) stage 4, GFR 15-29 ml/min (HCC) 02/23/2016   Trochanteric bursitis of left hip 01/17/2016   Left ankle pain 01/17/2016   Hypothyroidism 07/05/2015   Osteoporosis 05/26/2015   Cataract 05/26/2015    Bilateral knee pain 05/26/2015   Chronic low back pain 05/26/2015   Essential hypertension 05/26/2015   Allergic rhinitis due to pollen 02/14/2015   Gastroesophageal reflux disease without esophagitis 02/14/2015   History of esophageal stricture 02/14/2015   Carotid bruit 08/28/2011   Hyperlipidemia, unspecified 01/22/2011    Orientation RESPIRATION BLADDER Height & Weight     Self  O2 Incontinent, Indwelling catheter Weight: 126 lb (57.2 kg) Height:  5\' 1"  (154.9 cm)  BEHAVIORAL SYMPTOMS/MOOD NEUROLOGICAL BOWEL NUTRITION STATUS      Incontinent Diet (see discharge summary)  AMBULATORY STATUS COMMUNICATION OF NEEDS Skin   Total Care Verbally Surgical wounds, Other (Comment) (ecchymosis)                       Personal Care Assistance Level of Assistance  Bathing, Feeding, Dressing, Total care Bathing Assistance: Maximum assistance Feeding assistance: Maximum assistance Dressing Assistance: Maximum assistance Total Care Assistance: Maximum assistance   Functional Limitations Info  Sight, Hearing, Speech Sight Info: Impaired Hearing Info: Impaired Speech Info: Adequate    SPECIAL CARE FACTORS FREQUENCY  PT (By licensed PT), OT (By licensed OT)     PT Frequency: 5x week OT Frequency: 5x week            Contractures Contractures Info: Not present    Additional Factors Info  Code Status, Allergies Code Status Info: DNR-limited Allergies Info: Alendronate Sodium, Ace Inhibitors, Ezetimibe, Fenofibrate, Other, Rosuvastatin Calcium, Statins           Current Medications (02/18/2023):  This is the current hospital active medication list Current Facility-Administered Medications  Medication  Dose Route Frequency Provider Last Rate Last Admin   0.9 %  sodium chloride infusion (Manually program via Guardrails IV Fluids)   Intravenous Once Burnadette Pop, MD       0.9 %  sodium chloride infusion   Intravenous Continuous Burnadette Pop, MD 75 mL/hr at 02/18/23 1017  Rate Change at 02/18/23 1017   acetaminophen (TYLENOL) tablet 1,000 mg  1,000 mg Oral TID Ernie Avena, NP   1,000 mg at 02/18/23 1016   cefTRIAXone (ROCEPHIN) 1 g in sodium chloride 0.9 % 100 mL IVPB  1 g Intravenous Q24H Burnadette Pop, MD 200 mL/hr at 02/17/23 1635 1 g at 02/17/23 1635   cetirizine (ZYRTEC) tablet 10 mg  10 mg Oral QPM Rometta Emery, MD   10 mg at 02/16/23 1730   Chlorhexidine Gluconate Cloth 2 % PADS 6 each  6 each Topical Q0600 Burnadette Pop, MD   6 each at 02/18/23 0603   diazepam (VALIUM) tablet 1-2 mg  1-2 mg Oral Daily PRN Rometta Emery, MD       donepezil (ARICEPT) tablet 5 mg  5 mg Oral QHS Earlie Lou L, MD   5 mg at 02/17/23 2229   escitalopram (LEXAPRO) tablet 20 mg  20 mg Oral Daily Rometta Emery, MD   20 mg at 02/18/23 1016   gabapentin (NEURONTIN) capsule 100 mg  100 mg Oral BID PRN Rometta Emery, MD       HYDROmorphone (DILAUDID) injection 0.5 mg  0.5 mg Intravenous Q2H PRN Ernie Avena, NP       levothyroxine (SYNTHROID) tablet 50 mcg  50 mcg Oral Daily Rometta Emery, MD   50 mcg at 02/18/23 0601   midodrine (PROAMATINE) tablet 5 mg  5 mg Oral TID WC Burnadette Pop, MD   5 mg at 02/18/23 1016   mupirocin ointment (BACTROBAN) 2 % 1 Application  1 Application Nasal BID Burnadette Pop, MD   1 Application at 02/17/23 2240   OLANZapine (ZYPREXA) tablet 2.5 mg  2.5 mg Oral BID PRN Rometta Emery, MD       ondansetron (ZOFRAN) tablet 4 mg  4 mg Oral Q6H PRN Rometta Emery, MD       Or   ondansetron (ZOFRAN) injection 4 mg  4 mg Intravenous Q6H PRN Rometta Emery, MD       oxyCODONE (Oxy IR/ROXICODONE) immediate release tablet 5 mg  5 mg Oral Q4H PRN Earlie Lou L, MD   5 mg at 02/17/23 1327   timolol (TIMOPTIC) 0.25 % ophthalmic solution 1 drop  1 drop Both Eyes BID Rometta Emery, MD   1 drop at 02/18/23 1137     Discharge Medications: Please see discharge summary for a list of discharge  medications.  Relevant Imaging Results:  Relevant Lab Results:   Additional Information SSN: [id number]  Lorri Frederick, LCSW

## 2023-02-18 NOTE — Progress Notes (Signed)
PROGRESS NOTE  Laura Ellis  UJW:[id number] DOB: 09/18/1932 DOA: 02/15/2023 PCP: Patient, No Pcp Per   Brief Narrative: Patient is a 89 year old female with history of hypertension, hypothyroidism, osteoporosis, CKD stage IV, chronic back pain, dementia who presented with fall from home.  Imaging showed right comminuted femoral neck fracture.  Orthopedics  consulted,S/P ORIF.  Hospital course remarkable for acute blood loss anemia, AKI on CKD.  Palliative care, nephrology consulted.  Waiting for PT/OT evaluation   Assessment & Plan:  Principal Problem:   Right femoral fracture (HCC) Active Problems:   Osteoporosis   Essential hypertension   Hypothyroidism   CKD (chronic kidney disease) stage 4, GFR 15-29 ml/min (HCC)   PAD (peripheral artery disease) (HCC)   Gastroesophageal reflux disease without esophagitis   Hyperlipidemia, unspecified   Dementia (HCC)   Right hip fracture: Presented with fall at home.  Imaging showed right comminuted femoral neck fracture.  Status post right hip intramedullary nailing.  PT/OT evaluation pending  CKD stage 3b-4: Baseline creatinine in the range of 1.8.  Kidney function worsened  with creatinine in the range of 3.  This is most likely secondary to hemodynamic changes.  Patient was hypotensive during surgery.  Also has acute blood loss anemia from hip fracture.  Continue gentle IV fluids.  Foley has been placed.  Monitor input and output.  Ultrasound of the kidney showed bilateral renal atrophy,no collecting system dilatation.  Non-anion gap metabolic acidosis has resolved. She follows [facility].  Nephrology consulted today.  Leukocytosis: Presented with leukocytosis, now resolved.  Unclear if she has dysuria because of dementia.  Completed  3 days course of ceftriaxone  Acute blood loss anemia: From surgery, fracture.  Hemoglobin dropped from 10.4 to 6.4.  Transfused a unit of PRBC today.  Hemoglobin stable in the range of 8.4  today.  Hypertension: Currently blood pressure soft.  Losartan, metoprolol hold.  Continue to monitor  Hyperlipidemia: On  statin  Peripheral artery disease: On aspirin which is on hold for now.  Will resume tomorrow if hemoglobin remains stable  Hypothyroidism: Continue Synthyroid  GERD: Continue PPI  Dementia: Remains confused.  She is HOH .Takes Aricept at home.  Continue delirium precautions, frequent reorientation  Goals of care: Elderly female with dementia now with hip fracture, progressive renal failure.  Palliative care consulted for goals of care.  Currently she is DNR. prognosis guarded. She lives with her daughters.  Might need SNF on discharge.  PT/OT consulted         DVT prophylaxis:SCDs Start: 02/15/23 2207     Code Status: Limited: Do not attempt resuscitation (DNR) -DNR-LIMITED -Do Not Intubate/DNI   Family Communication:: Discussed with daughter [name]  on phone on 10/6  Patient status:Inpatient  Patient is from :Home  Anticipated discharge to:SNF  Estimated DC date: 2 to 3 days   Consultants: Orthopedics  Procedures:ORIF  Antimicrobials:  Anti-infectives (From admission, onward)    Start     Dose/Rate Route Frequency Ordered Stop   02/16/23 1800  ceFAZolin (ANCEF) IVPB 1 g/50 mL premix  Status:  Discontinued        1 g 100 mL/hr over 30 Minutes Intravenous Every 8 hours 02/16/23 1511 02/16/23 1511   02/16/23 1600  cefTRIAXone (ROCEPHIN) 1 g in sodium chloride 0.9 % 100 mL IVPB        1 g 200 mL/hr over 30 Minutes Intravenous Every 24 hours 02/16/23 1244 02/19/23 1559   02/16/23 1400  ceFAZolin (ANCEF) IVPB 1  g/50 mL premix        1 g 100 mL/hr over 30 Minutes Intravenous  Once 02/16/23 1345 02/16/23 1421       Subjective: Patient seen and examined at bedside today.  She looks more comfortable than yesterday.  More alert and awake.  Remains confused.  Not in any kind of distress.  Objective: Vitals:   02/17/23 2219 02/18/23 0108  02/18/23 0559 02/18/23 0735  BP: (!) 106/52 (!) 118/57 (!) 127/59 (!) 143/68  Pulse: (!) 107 88 88 84  Resp: 18 18 16 18   Temp: 99.1 F (37.3 C)  97.9 F (36.6 C) 98 F (36.7 C)  TempSrc: Oral Oral Oral Oral  SpO2: 93% 94% 97% 99%  Weight:      Height:        Intake/Output Summary (Last 24 hours) at 02/18/2023 1119 Last data filed at 02/18/2023 8295 Gross per 24 hour  Intake 2198.18 ml  Output 300 ml  Net 1898.18 ml   Filed Weights   02/16/23 1220  Weight: 57.2 kg    Examination:  General exam: Overall comfortable, not in distress, confused, lying in bed, weak and deconditioned HEENT: PERRL Respiratory system:  no wheezes or crackles  Cardiovascular system: S1 & S2 heard, RRR.  Gastrointestinal system: Abdomen is nondistended, soft and nontender. Central nervous system: Alert and awake but not oriented Extremities: Clean surgical wound on the right hip Skin: No rashes, no ulcers,no icterus   GU: Foley   Data Reviewed: I have personally reviewed following labs and imaging studies  CBC: Recent Labs  Lab 02/15/23 1730 02/16/23 0823 02/17/23 0753 02/17/23 1516 02/18/23 0634  WBC 16.5* 8.8 8.8  --  9.3  HGB 11.2* 10.4* 6.4* 8.5* 8.4*  HCT 35.7* 32.8* 19.7* 25.5* 26.5*  MCV 101.7* 102.5* 100.5*  --  96.4  PLT 288 283 169  --  133*   Basic Metabolic Panel: Recent Labs  Lab 02/15/23 1730 02/16/23 0823 02/16/23 1708 02/16/23 1951 02/17/23 0753 02/18/23 0634  NA 138 137 139  --  136 138  K 5.1 5.3* 5.7* 5.5* 4.4 4.0  CL 111 109 111  --  104 107  CO2 18* 16* 18*  --  23 20*  GLUCOSE 99 88 124*  --  131* 100*  BUN 34* 38* 38*  --  39* 37*  CREATININE 1.79* 2.80* 3.09*  --  3.39* 3.22*  CALCIUM 8.7* 8.7* 8.5*  --  8.0* 7.7*     Recent Results (from the past 240 hour(s))  Surgical PCR screen     Status: Abnormal   Collection Time: 02/16/23  5:46 AM   Specimen: Nasal Mucosa; Nasal Swab  Result Value Ref Range Status   MRSA, PCR NEGATIVE NEGATIVE Final    Staphylococcus aureus POSITIVE (A) NEGATIVE Final    Comment: (NOTE) The Xpert SA Assay (FDA approved for NASAL specimens in patients 92 years of age and older), is one component of a comprehensive surveillance program. It is not intended to diagnose infection nor to guide or monitor treatment. Performed at [facility], [address] [place], [place] [address]   Urine Culture (for pregnant, neutropenic or urologic patients or patients with an indwelling urinary catheter)     Status: None (Preliminary result)   Collection Time: 02/17/23  8:10 AM   Specimen: Urine, Catheterized  Result Value Ref Range Status   Specimen Description URINE, CATHETERIZED  Final   Special Requests NONE  Final   Culture  Final    CULTURE REINCUBATED FOR BETTER GROWTH Performed at [facility], [address] [place], [place] [address]    Report Status PENDING  Incomplete     Radiology Studies: US RENAL  Result Date: 02/17/2023 CLINICAL DATA:  Acute kidney injury EXAM: RENAL / URINARY TRACT ULTRASOUND COMPLETE COMPARISON:  Renal ultrasound 06/13/2017 FINDINGS: Right Kidney: Renal measurements: 7.4 x 3.8 x 6.1 cm = volume: 75 mL. Global atrophy. No collecting system dilatation or perinephric fluid. Left Kidney: Renal measurements: 7.5 x 4.1 x 4.6 cm = volume: 74 mL. Global volume loss. No collecting system dilatation or perinephric fluid. There is exophytic simple appearing cyst along the inferior aspect of the kidney which is anechoic, smoothly margins and through transmission measuring 7.0 x 5.4 x 6.2 cm previously this measured 7.6 cm. Bladder: Bladder is contracted. Other: None. IMPRESSION: Bilateral renal atrophy. No collecting system dilatation. Simple left-sided renal cyst. Electronically Signed   By: Karen Kays M.D.   On: 02/17/2023 16:01   DG HIP UNILAT WITH PELVIS 2-3 VIEWS RIGHT  Result Date: 02/16/2023 CLINICAL DATA:  Elective surgery. EXAM: DG HIP (WITH OR WITHOUT PELVIS) 2-3V RIGHT  COMPARISON:  Preoperative imaging FINDINGS: Three fluoroscopic spot views of the right hip obtained in the operating room. Femoral intramedullary nail with trans trochanteric and distal locking screw fixation traverse proximal femur fracture. Fluoroscopy time 2 minutes 5 seconds. Dose 17.4 mGy. IMPRESSION: Intraoperative fluoroscopy during right hip ORIF. Electronically Signed   By: Narda Rutherford M.D.   On: 02/16/2023 14:54   DG C-Arm 1-60 Min-No Report  Result Date: 02/16/2023 Fluoroscopy was utilized by the requesting physician.  No radiographic interpretation.   DG C-Arm 1-60 Min-No Report  Result Date: 02/16/2023 Fluoroscopy was utilized by the requesting physician.  No radiographic interpretation.    Scheduled Meds:  sodium chloride   Intravenous Once   acetaminophen  1,000 mg Oral TID   cetirizine  10 mg Oral QPM   Chlorhexidine Gluconate Cloth  6 each Topical Q0600   donepezil  5 mg Oral QHS   escitalopram  20 mg Oral Daily   levothyroxine  50 mcg Oral Daily   midodrine  5 mg Oral TID WC   mupirocin ointment  1 Application Nasal BID   timolol  1 drop Both Eyes BID   Continuous Infusions:  sodium chloride 75 mL/hr at 02/18/23 1017   cefTRIAXone (ROCEPHIN)  IV 1 g (02/17/23 1635)     LOS: 3 days   Burnadette Pop, MD [facility] P10/11/2022, 11:19 AM

## 2023-02-18 NOTE — Plan of Care (Signed)
  Problem: Activity: Goal: Risk for activity intolerance will decrease Outcome: Progressing   Problem: Nutrition: Goal: Adequate nutrition will be maintained Outcome: Progressing   Problem: Pain Managment: Goal: General experience of comfort will improve Outcome: Progressing   

## 2023-02-18 NOTE — Consult Note (Signed)
[facility]  HISTORY AND PHYSICAL  Laura Ellis is an 89 y.o. female.    Chief Complaint: AKI, hip fracture  HPI: Pt is an 42F with a PMH sig for HTN, HLD, osteoporosis, and CKD IV followed by me in clinic who is now seen in consultation at the request of Dr. Renford Dills for eval and recs re: AKI on CKD.    Pt underwent R hip ORIF after sustaining a mechanical fall at home 02/15/23.  She underwent surgery on 02/16/23.    Postop course has been complicated by acute blood loss anemia, relative hypotension, urinary retention, and AKI on CKD for which we are now consulted.    Baseline Cr is 1.7 and she is up to 3.39 10/6-->3.22 today 10/7.  Hbg nadired at 6.4 and she got 2 u pRBCs, now Hgb 8.5.  Lowest BP I see is in the 90s systolic.  Renal US shows kidneys are both around 7-7.5 cm- a full centimeter smaller on each side than the first renal US done in 2019.    She is lying in bed quietly.  No complaints.  Has some intermittent garbled speech which per RN is not new.    PMH: Past Medical History:  Diagnosis Date   Arthritis    Chronic low back pain 05/26/2015   CKD (chronic kidney disease) stage 4, GFR 15-29 ml/min (HCC) 02/23/2016   Essential hypertension 05/26/2015   Glaucoma    Hypertension    Hypothyroidism 07/05/2015   Osteopenia    Osteoporosis 05/26/2015   old Dexa reviewed, pt states doesn't want to be on the bisphosphonate therapy but is taking Ca and D    Spinal stenosis of lumbar region at multiple levels 03/13/2018   MRI [facility] orthopedics January 2019   PSH: Past Surgical History:  Procedure Laterality Date   ABDOMINAL AORTOGRAM W/LOWER EXTREMITY Left 12/29/2018   Procedure: ABDOMINAL AORTOGRAM W/LOWER EXTREMITY;  Surgeon: Maeola Harman, MD;  Location: [facility] INVASIVE CV LAB;  Service: Cardiovascular;  Laterality: Left;   ABDOMINAL HYSTERECTOMY     COLONOSCOPY  2015   [facility]   ENDOVENOUS ABLATION SAPHENOUS VEIN W/  LASER Right 12/27/2016   endovenous laser ablation right greater saphenous vein by Gretta Began MD   ESOPHAGOGASTRODUODENOSCOPY  2015   x2 or 3 times in [place]   ESOPHAGOGASTRODUODENOSCOPY (EGD) WITH ESOPHAGEAL DILATION N/A    INTRAMEDULLARY (IM) NAIL INTERTROCHANTERIC Right 02/16/2023   Procedure: INTRAMEDULLARY (IM) NAIL INTERTROCHANTERIC;  Surgeon: Luci Bank, MD;  Location: [facility] OR;  Service: Orthopedics;  Laterality: Right;   KNEE ARTHROSCOPY     PERIPHERAL VASCULAR ATHERECTOMY  12/29/2018   Procedure: PERIPHERAL VASCULAR ATHERECTOMY;  Surgeon: Maeola Harman, MD;  Location: [facility] INVASIVE CV LAB;  Service: Cardiovascular;;  left SFA   PERIPHERAL VASCULAR BALLOON ANGIOPLASTY  12/29/2018   Procedure: PERIPHERAL VASCULAR BALLOON ANGIOPLASTY;  Surgeon: Maeola Harman, MD;  Location: [facility] INVASIVE CV LAB;  Service: Cardiovascular;;  Left SFA   VARICOSE VEIN SURGERY       Past Medical History:  Diagnosis Date   Arthritis    Chronic low back pain 05/26/2015   CKD (chronic kidney disease) stage 4, GFR 15-29 ml/min (HCC) 02/23/2016   Essential hypertension 05/26/2015   Glaucoma    Hypertension    Hypothyroidism 07/05/2015   Osteopenia    Osteoporosis 05/26/2015   old Dexa reviewed, pt states doesn't want to be on the bisphosphonate therapy but is taking Ca and D    Spinal  stenosis of lumbar region at multiple levels 03/13/2018   MRI [facility] orthopedics January 2019    Medications:  Scheduled:  sodium chloride   Intravenous Once   acetaminophen  1,000 mg Oral TID   cetirizine  10 mg Oral QPM   Chlorhexidine Gluconate Cloth  6 each Topical Q0600   donepezil  5 mg Oral QHS   escitalopram  20 mg Oral Daily   levothyroxine  50 mcg Oral Daily   midodrine  5 mg Oral TID WC   mupirocin ointment  1 Application Nasal BID   timolol  1 drop Both Eyes BID    Medications Prior to Admission  Medication Sig Dispense Refill   amLODipine (NORVASC) 5 MG tablet TAKE  ONE TABLET BY MOUTH EVERY DAY 100 tablet 3   aspirin EC 81 MG tablet Take 1 tablet (81 mg total) by mouth daily.     Coenzyme Q10 (COQ10) 100 MG CAPS Take 100 mg by mouth daily.     Control Gel Formula Dressing (DUODERM CGF EXTRA THIN) MISC Apply every 72 hours (Patient taking differently: as needed (skin tears). Apply every 72 hours) 10 each 1   diazepam (VALIUM) 2 MG tablet Take 1-2 mg by mouth daily as needed.     donepezil (ARICEPT) 5 MG tablet Take 5 mg by mouth at bedtime.     escitalopram (LEXAPRO) 20 MG tablet Take 1 tablet (20 mg total) by mouth daily. 90 tablet 3   gabapentin (NEURONTIN) 100 MG capsule Take 100 mg by mouth 2 (two) times daily as needed (nerve pain).     levocetirizine (XYZAL) 5 MG tablet Take 5 mg by mouth every evening.     levothyroxine (SYNTHROID) 50 MCG tablet TAKE ONE TABLET BY MOUTH EVERY DAY 90 tablet 3   losartan (COZAAR) 25 MG tablet TAKE ONE TABLET BY MOUTH EVERY DAY 100 tablet 1   oxycodone (OXY-IR) 5 MG capsule Take 5 mg by mouth every 4 (four) hours as needed for pain.     THALITONE 15 MG tablet Take 1 tablet (15 mg total) by mouth 2 (two) times a week. Tuesdays and Fridays once a day. (Patient taking differently: Take 15 mg by mouth 2 (two) times a week.) 30 tablet 1   timolol (TIMOPTIC) 0.25 % ophthalmic solution Place 1 drop into both eyes 2 (two) times daily.     Ascorbic Acid (VITAMIN C) 1000 MG tablet Take 1,000 mg by mouth daily. (Patient not taking: Reported on 02/15/2023)     Cyanocobalamin (VITAMIN B-12 PO) Take 1 tablet by mouth every other day. (Patient not taking: Reported on 02/15/2023)     famotidine (PEPCID) 20 MG tablet Take by mouth in the morning and at bedtime. (Patient not taking: Reported on 02/15/2023)     OLANZapine (ZYPREXA) 2.5 MG tablet Take 2.5 mg by mouth 2 (two) times daily as needed. (Patient not taking: Reported on 02/15/2023)      ALLERGIES:   Allergies  Allergen Reactions   Alendronate Sodium     Esophagitis    Ace  Inhibitors     Cough   Ezetimibe Other (See Comments)    Unknown rxn   Fenofibrate     Arthralgia    Other     Other reaction(s): dizziness   Rosuvastatin Calcium     Muscle aches   Statins Other (See Comments)    Muscle aches    FAM HX: Family History  Problem Relation Age of Onset   Stroke Mother  Lung cancer Father    Heart disease Sister    Hypertension Sister    Hypertension Daughter    Rheum arthritis Daughter    Fibromyalgia Daughter    Colon cancer Neg Hx    Esophageal cancer Neg Hx     Social History:   reports that she has quit smoking. Her smoking use included cigarettes. She has never used smokeless tobacco. She reports current alcohol use of about 1.0 standard drink of alcohol per week. She reports that she does not use drugs.  ROS: ROS: not able to get a great ROS d/t dementia/ garbled speech  Blood pressure (!) 143/68, pulse 84, temperature 98 F (36.7 C), temperature source Oral, resp. rate 18, height 5\' 1"  (1.549 m), weight 57.2 kg, SpO2 99%. PHYSICAL EXAM: Physical Exam GEN lying in bed HEENT anicteric sclera NECK no JVD PULM clear CV RRR ABD soft EXT no LE edema NEURO + garbled speech, not able to fully have a conversation SKIN warm and dry MSK R leg in protector   Results for orders placed or performed during the hospital encounter of 02/15/23 (from the past 48 hour(s))  Basic metabolic panel     Status: Abnormal   Collection Time: 02/16/23  5:08 PM  Result Value Ref Range   Sodium 139 135 - 145 mmol/L   Potassium 5.7 (H) 3.5 - 5.1 mmol/L   Chloride 111 98 - 111 mmol/L   CO2 18 (L) 22 - 32 mmol/L   Glucose, Bld 124 (H) 70 - 99 mg/dL    Comment: Glucose reference range applies only to samples taken after fasting for at least 8 hours.   BUN 38 (H) 8 - 23 mg/dL   Creatinine, Ser 1.61 (H) 0.44 - 1.00 mg/dL   Calcium 8.5 (L) 8.9 - 10.3 mg/dL   GFR, Estimated 14 (L) >60 mL/min    Comment: (NOTE) Calculated using the CKD-EPI Creatinine  Equation (2021)    Anion gap 10 5 - 15    Comment: Performed at [facility] Lab, [address] [place], [place] [address]  Potassium     Status: Abnormal   Collection Time: 02/16/23  7:51 PM  Result Value Ref Range   Potassium 5.5 (H) 3.5 - 5.1 mmol/L    Comment: Performed at [facility] Lab, [address] [place], [place] [address]  CBC     Status: Abnormal   Collection Time: 02/17/23  7:53 AM  Result Value Ref Range   WBC 8.8 4.0 - 10.5 K/uL   RBC 1.96 (L) 3.87 - 5.11 MIL/uL   Hemoglobin 6.4 (LL) 12.0 - 15.0 g/dL    Comment: REPEATED TO VERIFY THIS CRITICAL RESULT HAS VERIFIED AND BEEN CALLED TO L ILLERBRUN RN BY DANIELLE LONG ON 10 06 2024 AT 0930, AND HAS BEEN READ BACK.     HCT 19.7 (L) 36.0 - 46.0 %   MCV 100.5 (H) 80.0 - 100.0 fL   MCH 32.7 26.0 - 34.0 pg   MCHC 32.5 30.0 - 36.0 g/dL   RDW 11.9 14.7 - 82.9 %   Platelets 169 150 - 400 K/uL    Comment: REPEATED TO VERIFY   nRBC 0.0 0.0 - 0.2 %    Comment: Performed at [facility] Lab, [address], [place], [place] [address]  Basic metabolic panel     Status: Abnormal   Collection Time: 02/17/23  7:53 AM  Result Value Ref Range   Sodium 136 135 - 145 mmol/L  Potassium 4.4 3.5 - 5.1 mmol/L   Chloride 104 98 - 111 mmol/L   CO2 23 22 - 32 mmol/L   Glucose, Bld 131 (H) 70 - 99 mg/dL    Comment: Glucose reference range applies only to samples taken after fasting for at least 8 hours.   BUN 39 (H) 8 - 23 mg/dL   Creatinine, Ser 8.29 (H) 0.44 - 1.00 mg/dL   Calcium 8.0 (L) 8.9 - 10.3 mg/dL   GFR, Estimated 13 (L) >60 mL/min    Comment: (NOTE) Calculated using the CKD-EPI Creatinine Equation (2021)    Anion gap 9 5 - 15    Comment: Performed at [facility], [address], [place], [place] [address]  Urine Culture (for pregnant, neutropenic or urologic patients or patients with an indwelling urinary catheter)     Status: Abnormal (Preliminary result)   Collection Time: 02/17/23  8:10 AM   Specimen:  Urine, Catheterized  Result Value Ref Range   Specimen Description URINE, CATHETERIZED    Special Requests NONE    Culture (A)     20,000 COLONIES/mL STAPHYLOCOCCUS AUREUS SUSCEPTIBILITIES TO FOLLOW Performed at [facility], [address], [place], [place] [address]    Report Status PENDING   Prepare RBC (crossmatch)     Status: None   Collection Time: 02/17/23 10:00 AM  Result Value Ref Range   Order Confirmation      ORDER PROCESSED BY BLOOD BANK Performed at [facility] Lab, [address], [place], [place] [address]   Sodium, urine, random     Status: None   Collection Time: 02/17/23 11:28 AM  Result Value Ref Range   Sodium, Ur 27 mmol/L    Comment: Performed at [facility] Lab, [address] [place], [place] [address]  Hemoglobin and hematocrit, blood     Status: Abnormal   Collection Time: 02/17/23  3:16 PM  Result Value Ref Range   Hemoglobin 8.5 (L) 12.0 - 15.0 g/dL    Comment: REPEATED TO VERIFY POST TRANSFUSION SPECIMEN    HCT 25.5 (L) 36.0 - 46.0 %    Comment: Performed at [facility] Lab, [address], [place], [place] [address]  CBC     Status: Abnormal   Collection Time: 02/18/23  6:34 AM  Result Value Ref Range   WBC 9.3 4.0 - 10.5 K/uL   RBC 2.75 (L) 3.87 - 5.11 MIL/uL   Hemoglobin 8.4 (L) 12.0 - 15.0 g/dL   HCT 40.1 (L) 02.7 - 25.3 %   MCV 96.4 80.0 - 100.0 fL   MCH 30.5 26.0 - 34.0 pg   MCHC 31.7 30.0 - 36.0 g/dL   RDW 66.4 40.3 - 47.4 %   Platelets 133 (L) 150 - 400 K/uL   nRBC 0.0 0.0 - 0.2 %    Comment: Performed at [facility] Lab, [address], [place], [place] [address]  Basic metabolic panel     Status: Abnormal   Collection Time: 02/18/23  6:34 AM  Result Value Ref Range   Sodium 138 135 - 145 mmol/L   Potassium 4.0 3.5 - 5.1 mmol/L   Chloride 107 98 - 111 mmol/L   CO2 20 (L) 22 - 32 mmol/L   Glucose, Bld 100 (H) 70 - 99 mg/dL    Comment: Glucose reference range applies only to samples taken after fasting for at  least 8 hours.   BUN 37 (H) 8 - 23 mg/dL  Creatinine, Ser 3.22 (H) 0.44 - 1.00 mg/dL   Calcium 7.7 (L) 8.9 - 10.3 mg/dL   GFR, Estimated 13 (L) >60 mL/min    Comment: (NOTE) Calculated using the CKD-EPI Creatinine Equation (2021)    Anion gap 11 5 - 15    Comment: Performed at [facility] Lab, [address] [place], [place] [address]    US RENAL  Result Date: 02/17/2023 CLINICAL DATA:  Acute kidney injury EXAM: RENAL / URINARY TRACT ULTRASOUND COMPLETE COMPARISON:  Renal ultrasound 06/13/2017 FINDINGS: Right Kidney: Renal measurements: 7.4 x 3.8 x 6.1 cm = volume: 75 mL. Global atrophy. No collecting system dilatation or perinephric fluid. Left Kidney: Renal measurements: 7.5 x 4.1 x 4.6 cm = volume: 74 mL. Global volume loss. No collecting system dilatation or perinephric fluid. There is exophytic simple appearing cyst along the inferior aspect of the kidney which is anechoic, smoothly margins and through transmission measuring 7.0 x 5.4 x 6.2 cm previously this measured 7.6 cm. Bladder: Bladder is contracted. Other: None. IMPRESSION: Bilateral renal atrophy. No collecting system dilatation. Simple left-sided renal cyst. Electronically Signed   By: Karen Kays M.D.   On: 02/17/2023 16:01   DG HIP UNILAT WITH PELVIS 2-3 VIEWS RIGHT  Result Date: 02/16/2023 CLINICAL DATA:  Elective surgery. EXAM: DG HIP (WITH OR WITHOUT PELVIS) 2-3V RIGHT COMPARISON:  Preoperative imaging FINDINGS: Three fluoroscopic spot views of the right hip obtained in the operating room. Femoral intramedullary nail with trans trochanteric and distal locking screw fixation traverse proximal femur fracture. Fluoroscopy time 2 minutes 5 seconds. Dose 17.4 mGy. IMPRESSION: Intraoperative fluoroscopy during right hip ORIF. Electronically Signed   By: Narda Rutherford M.D.   On: 02/16/2023 14:54   DG C-Arm 1-60 Min-No Report  Result Date: 02/16/2023 Fluoroscopy was utilized by the requesting physician.  No radiographic  interpretation.   DG C-Arm 1-60 Min-No Report  Result Date: 02/16/2023 Fluoroscopy was utilized by the requesting physician.  No radiographic interpretation.    Assessment/Plan  AKI on CKD IV: likely hemodynamic with decreased effective arterial blood flow.  Renal US shows smaller kidneys than 5 years ago.    - agree with the supportive measures- IVFs, blood products, Foley  - avoid hypotension- < 110/ 60  - no NSAIDs or other nephrotoxic agents  - hopefully she will improve with supportive care- she is not a good candidate for dialysis  - I greatly appreciate palliative care  2.  S/p R ORIF  - after mech fall  3.  Dementia  - has had a sig decline in the last 5 years  4.  Acute blood loss anemia:  - in the setting of surgery  - s/p 2 u pRBCs  - will order iron panel  5.  Dispo: pending  Alberto Pina 02/18/2023, 2:16 PM

## 2023-02-18 NOTE — Care Management Important Message (Signed)
Important Message  Patient Details  Name: Laura Ellis MRN: [id number] Date of Birth: Feb 28, 1933   Important Message Given:  Yes - Medicare IM     Sherilyn Banker 02/18/2023, 1:36 PM

## 2023-02-18 NOTE — Evaluation (Signed)
Physical Therapy Evaluation Patient Details Name: Laura Ellis MRN: [id number] DOB: 03/27/1933 Today's Date: 02/18/2023  History of Present Illness  Patient is a 89 year old female with history of hypertension, hypothyroidism, osteoporosis, CKD stage IV, chronic back pain, dementia who presented to [facility] 10/4 with fall from home.  Imaging showed right comminuted femoral neck fracture.  Orthopedics  consulted,S/P ORIF 02/16/23.  Hospital course remarkable for acute blood loss anemia, AKI on CKD.  Palliative care, nephrology consulted. PT/OT not consulted until 02/18/23.   Clinical Impression  Patient received in bed, she is alert, very HOH, confused. Patient requires total assist for bed mobility at this time due to confusion and resisting movement. She will continue to benefit from skilled PT as able to improve functional mobility, strength and independence.         If plan is discharge home, recommend the following: Two people to help with walking and/or transfers;Two people to help with bathing/dressing/bathroom;Assist for transportation;Direct supervision/assist for medications management;Assistance with cooking/housework   Can travel by private vehicle    no    Equipment Recommendations Other (comment) (TBD)  Recommendations for Other Services       Functional Status Assessment Patient has had a recent decline in their functional status and demonstrates the ability to make significant improvements in function in a reasonable and predictable amount of time.     Precautions / Restrictions Precautions Precautions: Fall Restrictions Weight Bearing Restrictions: No      Mobility  Bed Mobility Overal bed mobility: Needs Assistance Bed Mobility: Rolling, Supine to Sit, Sit to Supine Rolling: Total assist   Supine to sit: Total assist Sit to supine: Total assist   General bed mobility comments: patient resisting movement, stating "no,no no" with attempting to move her in  bed.    Transfers                   General transfer comment: unable due to pain and resistance    Ambulation/Gait               General Gait Details: unable  Stairs            Wheelchair Mobility     Tilt Bed    Modified Rankin (Stroke Patients Only)       Balance Overall balance assessment: Needs assistance Sitting-balance support: No upper extremity supported, Feet unsupported Sitting balance-Leahy Scale: Zero Sitting balance - Comments: requires total assist at this time                                     Pertinent Vitals/Pain Pain Assessment Pain Assessment: Faces Faces Pain Scale: Hurts even more Pain Location: R LE, but grimaces also with movement of L LE Pain Descriptors / Indicators: Discomfort, Grimacing, Guarding Pain Intervention(s): Monitored during session, Repositioned    Home Living Family/patient expects to be discharged to:: Skilled nursing facility Living Arrangements: Alone Available Help at Discharge: Family;Available PRN/intermittently               Additional Comments: patient unable to provide home information due to dementia and very HOH, no family present at bedside.    Prior Function               Mobility Comments: unsure ADLs Comments: unsure     Extremity/Trunk Assessment   Upper Extremity Assessment Upper Extremity Assessment: Defer to OT evaluation    Lower Extremity Assessment  Lower Extremity Assessment: RLE deficits/detail;LLE deficits/detail RLE: Unable to fully assess due to pain LLE: Unable to fully assess due to pain    Cervical / Trunk Assessment Cervical / Trunk Assessment: Normal  Communication   Communication Communication: Hearing impairment Cueing Techniques: Verbal cues;Gestural cues  Cognition Arousal: Alert Behavior During Therapy: Anxious Overall Cognitive Status: No family/caregiver present to determine baseline cognitive functioning                                  General Comments: history of dementia        General Comments      Exercises     Assessment/Plan    PT Assessment Patient needs continued PT services  PT Problem List Decreased activity tolerance;Decreased mobility;Decreased balance;Decreased range of motion;Decreased cognition;Decreased safety awareness;Decreased knowledge of use of DME;Pain       PT Treatment Interventions DME instruction;Gait training;Functional mobility training;Therapeutic activities;Therapeutic exercise;Patient/family education;Balance training;Modalities    PT Goals (Current goals can be found in the Care Plan section)  Acute Rehab PT Goals PT Goal Formulation: Patient unable to participate in goal setting Time For Goal Achievement: 03/04/23    Frequency Min 1X/week     Co-evaluation               AM-PAC PT "6 Clicks" Mobility  Outcome Measure Help needed turning from your back to your side while in a flat bed without using bedrails?: Total Help needed moving from lying on your back to sitting on the side of a flat bed without using bedrails?: Total Help needed moving to and from a bed to a chair (including a wheelchair)?: Total Help needed standing up from a chair using your arms (e.g., wheelchair or bedside chair)?: Total Help needed to walk in hospital room?: Total Help needed climbing 3-5 steps with a railing? : Total 6 Click Score: 6    End of Session Equipment Utilized During Treatment: Oxygen Activity Tolerance: Patient limited by pain Patient left: in bed;with call bell/phone within reach;with bed alarm set Nurse Communication: Mobility status PT Visit Diagnosis: Other abnormalities of gait and mobility (R26.89);Pain;History of falling (Z91.81) Pain - Right/Left: Right Pain - part of body: Leg    Time: 1410-1420 PT Time Calculation (min) (ACUTE ONLY): 10 min   Charges:   PT Evaluation $PT Eval Moderate Complexity: 1 Mod   PT General  Charges $$ ACUTE PT VISIT: 1 Visit        Elihu Milstein, PT, GCS 02/18/23,2:52 PM

## 2023-02-18 NOTE — TOC Initial Note (Signed)
Transition of Care [facility]) - Initial/Assessment Note    Patient Details  Name: Laura Ellis MRN: [id number] Date of Birth: 20-Feb-1933  Transition of Care [facility]) CM/SW Contact:    Epifanio Lesches, RN Phone Number: 02/18/2023, 3:02 PM  Clinical Narrative:                 Presents after a fall. Suffered R hip fx.      - s/p  R IMN 10/5 Per daughter [name] pt has a 2 level farm home that she and sister Marcelino Duster reside   with pt. States they are pt's primary caregivers. States they recently hired a person Engineer, manufacturing systems to help, private pay. Pt with RW and cane @ home.  PT/OT evaluation pending...  Palliative following ....  TOC team will continue to monitor and assist with needs.  Expected Discharge Plan: Skilled Nursing Facility Barriers to Discharge: Continued Medical Work up   Patient Goals and CMS Choice            Expected Discharge Plan and Services   Discharge Planning Services: CM Consult   Living arrangements for the past 2 months: Single Family Home                                      Prior Living Arrangements/Services Living arrangements for the past 2 months: Single Family Home Lives with:: Adult Children Patient language and need for interpreter reviewed:: Yes        Need for Family Participation in Patient Care: Yes (Comment) Care giver support system in place?: Yes (comment) Current home services: DME Gilmer Mor, RW) Criminal Activity/Legal Involvement Pertinent to Current Situation/Hospitalization: No - Comment as needed  Activities of Daily Living   ADL Screening (condition at time of admission) Independently performs ADLs?: Yes (appropriate for developmental age) Is the patient deaf or have difficulty hearing?: Yes Does the patient have difficulty seeing, even when wearing glasses/contacts?: No Does the patient have difficulty concentrating, remembering, or making decisions?: Yes  Permission Sought/Granted                   Emotional Assessment       Orientation: : Oriented to Self Alcohol / Substance Use: Not Applicable Psych Involvement: No (comment)  Admission diagnosis:  Right femoral fracture (HCC) [S72.91XA] Fall from slip, trip, or stumble, initial encounter [W01.0XXA] Closed right hip fracture, initial encounter (HCC) [S72.001A] Stage 4 chronic kidney disease (HCC) [N18.4] Patient Active Problem List   Diagnosis Date Noted   Right femoral fracture (HCC) 02/15/2023   Acute midline low back pain without sciatica 05/21/2022   Pressure ulcer of left ankle, stage 2 (HCC) 05/21/2022   Aphasia 04/02/2021   Dementia (HCC) 04/02/2021   Right shoulder pain 04/02/2021   Elevated vitamin B12 level 03/24/2019   High serum vitamin B12 02/18/2019   PAD (peripheral artery disease) (HCC) 12/04/2018   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   Schatzki's ring of distal esophagus 11/26/2018   Hiatal hernia 11/26/2018   Spinal stenosis of lumbar region at multiple levels 03/13/2018   Advance directive discussed with patient 01/16/2018   Chronic venous insufficiency 09/26/2017   Elevated blood pressure reading with diagnosis of hypertension 09/26/2017   Renal cyst, left 07/18/2017   Chronic pain syndrome 05/25/2017   Degenerative scoliosis 05/15/2017   Degenerative spondylolisthesis 05/15/2017   Neuropathic pain 05/15/2017   Orthostatic dizziness 03/07/2017   CKD (chronic kidney disease)  stage 4, GFR 15-29 ml/min (HCC) 02/23/2016   Trochanteric bursitis of left hip 01/17/2016   Left ankle pain 01/17/2016   Hypothyroidism 07/05/2015   Osteoporosis 05/26/2015   Cataract 05/26/2015   Bilateral knee pain 05/26/2015   Chronic low back pain 05/26/2015   Essential hypertension 05/26/2015   Allergic rhinitis due to pollen 02/14/2015   Gastroesophageal reflux disease without esophagitis 02/14/2015   History of esophageal stricture 02/14/2015   Carotid bruit 08/28/2011   Hyperlipidemia, unspecified 01/22/2011    PCP:  Patient, No Pcp Per Pharmacy:   [facility] - [place], [place] - [address] [facility] [place] [address] Phone: [phone] Fax: [phone]  [facility] - [place], [place] - [address] [place] [address] Phone: [phone] Fax: [phone]     Social Determinants of Health (SDOH) Social History: SDOH Screenings   Food Insecurity: No Food Insecurity (02/15/2023)  Housing: Low Risk  (02/15/2023)  Transportation Needs: No Transportation Needs (02/15/2023)  Utilities: Not At Risk (02/15/2023)  Alcohol Screen: Low Risk  (05/10/2020)  Depression (PHQ2-9): Low Risk  (05/21/2022)  Financial Resource Strain: Low Risk  (01/03/2022)   Received from [facility], [facility]  Physical Activity: Inactive (05/10/2020)  Social Connections: Unknown (09/21/2021)   Received from [facility], [facility]  Stress: No Stress Concern Present (01/03/2022)   Received from [facility], [facility]  Tobacco Use: Medium Risk (02/16/2023)   SDOH Interventions:     Readmission Risk Interventions     No data to display

## 2023-02-18 NOTE — TOC Progression Note (Addendum)
Transition of Care [facility]) - Progression Note    Patient Details  Name: Laura Ellis MRN: [id number] Date of Birth: [birth year]  Transition of Care [facility]) CM/SW Contact  Lorri Frederick, LCSW Phone Number: 02/18/2023, 3:43 PM  Clinical Narrative:   Pt oriented x1.  PT recommendation for SNF.  CSW confirmed with daughter [name] that she does want to pursue SNF, medicare choice document provided, she is requesting [name], [address] [place], [place], or [place].  Referral sent out in hub for SNF.  [facility] is full, per Dru.   Expected Discharge Plan: Skilled Nursing Facility Barriers to Discharge: Continued Medical Work up  Expected Discharge Plan and Services   Discharge Planning Services: CM Consult   Living arrangements for the past 2 months: Single Family Home                                       Social Determinants of Health (SDOH) Interventions SDOH Screenings   Food Insecurity: No Food Insecurity (02/15/2023)  Housing: Low Risk  (02/15/2023)  Transportation Needs: No Transportation Needs (02/15/2023)  Utilities: Not At Risk (02/15/2023)  Alcohol Screen: Low Risk  (05/10/2020)  Depression (PHQ2-9): Low Risk  (05/21/2022)  Financial Resource Strain: Low Risk  (01/03/2022)   Received from [facility], [facility]  Physical Activity: Inactive (05/10/2020)  Social Connections: Unknown (09/21/2021)   Received from [facility], [facility]  Stress: No Stress Concern Present (01/03/2022)   Received from [facility], [facility]  Tobacco Use: Medium Risk (02/16/2023)    Readmission Risk Interventions     No data to display

## 2023-02-18 NOTE — Progress Notes (Signed)
   Palliative Medicine Inpatient Follow Up Note HPI: Patient is a 89 year old female with history of hypertension, hypothyroidism, osteoporosis, CKD stage IV, chronic back pain, dementia who presented with fall from home.  Imaging showed right comminuted femoral neck fracture.  Orthopedics  consulted,S/P ORIF.  Hospital course remarkable for acute blood loss anemia, AKI on CKD.   Palliative care asked to get involved to assist with goals of care conversations.   Today's Discussion 02/18/2023  *Please note that this is a verbal dictation therefore any spelling or grammatical errors are due to the "Dragon Medical One" system interpretation.  Chart reviewed inclusive of vital signs, progress notes, laboratory results, and diagnostic images.   I met with Cherylann Ratel at bedside this morning. She is alert and oriented to self. Her speech is much more clear today. She endorses generalized pain though otherwise denies nausea or shortness of breath.  I spoke to patients daughter, [name]. Created space and opportunity for [name] to explore thoughts feelings and fears regarding Terril' current medical situation. We discussed the improvements that Darika has made thus far. We reviewed the hope for ongoing improvements, [name] asks when nephrology will provide insights on Jakeya' AKI. I shared that I would FU with the hospitalist in regards to this.  Plan as of present is to continue to allow time for outcomes.   Questions and concerns addressed/Palliative Support Provided.   Objective Assessment: Vital Signs Vitals:   02/18/23 0559 02/18/23 0735  BP: (!) 127/59 (!) 143/68  Pulse: 88 84  Resp: 16 18  Temp: 97.9 F (36.6 C) 98 F (36.7 C)  SpO2: 97% 99%    Intake/Output Summary (Last 24 hours) at 02/18/2023 1303 Last data filed at 02/18/2023 1610 Gross per 24 hour  Intake 2198.18 ml  Output 300 ml  Net 1898.18 ml   Last Weight  Most recent update: 02/16/2023 12:21 PM    Weight  57.2 kg (126 lb)             Gen:  Frail elderly Caucasian F in NAD HEENT: Dry mucous membranes CV: Regular rate and rhythm  PULM: On 2LPM [place], breathing is even and nonlabored ABD: soft/nontender  EXT: No edema  Neuro: Disoriented  SUMMARY OF RECOMMENDATIONS   DNAR/DNI   AD's in Vynca   Continue to allow time for outcomes    Ongoing PMT support   Symptoms as below  Symptom Management:  Pain Acute on Chronic: - Tylenol 1G TID - Oxycodone 5mg  PO Q4H PRN - Continue dilaudid 0.5mg  Q3H PRN severe pain  Adult FTT: - Nutrition consult - Provide palatable food options - 1:1 feeding  Weakness: - PT/OT   Encephalopathy: - Strict delirium precautions  Billing based on MDM: Moderate ______________________________________________________________________________________ Lamarr Lulas [place] Palliative Medicine Team Team Cell Phone: [phone] Please utilize secure chat with additional questions, if there is no response within 30 minutes please call the above phone number  Palliative Medicine Team providers are available by phone from 7am to 7pm daily and can be reached through the team cell phone.  Should this patient require assistance outside of these hours, please call the patient's attending physician.

## 2023-02-18 NOTE — TOC CAGE-AID Note (Signed)
Transition of Care [facility]) - CAGE-AID Screening  Patient Details  Name: Laura Ellis MRN: [id number] Date of Birth: [birth year]  Clinical Narrative:  Patient to ED after a fall at home. Patient has had recent dementia. Per chart review patient has expressed in the past no drug use and only occasional alcohol use (1 drink/week). Patient is currently only oriented to self limiting this screening. No substance abuse resources were provided to patient at this time.  CAGE-AID Screening:   Have You Ever Felt You Ought to Cut Down on Your Drinking or Drug Use?: No Have People Annoyed You By Critizing Your Drinking Or Drug Use?: No Have You Felt Bad Or Guilty About Your Drinking Or Drug Use?: No Have You Ever Had a Drink or Used Drugs First Thing In The Morning to Steady Your Nerves or to Get Rid of a Hangover?: No CAGE-AID Score: 0  Substance Abuse Education Offered: No

## 2023-02-19 DIAGNOSIS — Z515 Encounter for palliative care: Secondary | ICD-10-CM | POA: Diagnosis not present

## 2023-02-19 DIAGNOSIS — S72351A Displaced comminuted fracture of shaft of right femur, initial encounter for closed fracture: Secondary | ICD-10-CM | POA: Diagnosis not present

## 2023-02-19 DIAGNOSIS — Z7189 Other specified counseling: Secondary | ICD-10-CM | POA: Diagnosis not present

## 2023-02-19 LAB — BASIC METABOLIC PANEL
Anion gap: 10 (ref 5–15)
BUN: 34 mg/dL — ABNORMAL HIGH (ref 8–23)
CO2: 19 mmol/L — ABNORMAL LOW (ref 22–32)
Calcium: 7.9 mg/dL — ABNORMAL LOW (ref 8.9–10.3)
Chloride: 109 mmol/L (ref 98–111)
Creatinine, Ser: 2.56 mg/dL — ABNORMAL HIGH (ref 0.44–1.00)
GFR, Estimated: 18 mL/min — ABNORMAL LOW (ref >60)
Glucose, Bld: 86 mg/dL (ref 70–99)
Potassium: 3.9 mmol/L (ref 3.5–5.1)
Sodium: 138 mmol/L (ref 135–145)

## 2023-02-19 LAB — CBC
HCT: 27.2 % — ABNORMAL LOW (ref 36.0–46.0)
Hemoglobin: 8.7 g/dL — ABNORMAL LOW (ref 12.0–15.0)
MCH: 30.6 pg (ref 26.0–34.0)
MCHC: 32 g/dL (ref 30.0–36.0)
MCV: 95.8 fL (ref 80.0–100.0)
Platelets: 159 10*3/uL (ref 150–400)
RBC: 2.84 MIL/uL — ABNORMAL LOW (ref 3.87–5.11)
RDW: 14.3 % (ref 11.5–15.5)
WBC: 11 10*3/uL — ABNORMAL HIGH (ref 4.0–10.5)
nRBC: 0 % (ref 0.0–0.2)

## 2023-02-19 LAB — URINE CULTURE: Culture: 20000 — AB

## 2023-02-19 MED ORDER — FERROUS SULFATE 325 (65 FE) MG PO TABS
325.0000 mg | ORAL_TABLET | Freq: Every day | ORAL | Status: DC
  Administered 2023-02-20 – 2023-02-22 (×2): 325 mg via ORAL
  Filled 2023-02-19 (×3): qty 1

## 2023-02-19 MED ORDER — SODIUM CHLORIDE 0.9 % IV SOLN
100.0000 mg | Freq: Once | INTRAVENOUS | Status: AC
  Administered 2023-02-19: 100 mg via INTRAVENOUS
  Filled 2023-02-19: qty 5

## 2023-02-19 MED ORDER — ASPIRIN 81 MG PO TBEC
81.0000 mg | DELAYED_RELEASE_TABLET | Freq: Two times a day (BID) | ORAL | Status: DC
  Administered 2023-02-19 – 2023-02-22 (×6): 81 mg via ORAL
  Filled 2023-02-19 (×7): qty 1

## 2023-02-19 NOTE — Progress Notes (Signed)
[facility] Progress Note   Assessment/ Plan:   AKI on CKD IV: likely hemodynamic with decreased effective arterial blood flow.  Renal US shows smaller kidneys than 5 years ago.               - agree with the supportive measures- IVFs, blood products, Foley.  Put end time on order for 1700 today             - avoid hypotension- < 110/ 60             - no NSAIDs or other nephrotoxic agents             - I greatly appreciate palliative care  - no other suggestions- will sign off- call with questions   2.  S/p R ORIF             - after mech fall   3.  Dementia             - has had a sig decline in the last 5 years   4.  Acute blood loss anemia:             - in the setting of surgery             - s/p 2 u pRBCs             - will order iron panel- iron deficient, getting IV and PO iron   5.  Dispo: pending  Subjective:    Seen in room.  Much better than yesterday.  Cr coming down and pt is more awake and alert.   Objective:   BP (!) 141/67 (BP Location: Right Arm)   Pulse (!) 105   Temp 98.4 F (36.9 C) (Oral)   Resp 20   Ht 5\' 1"  (1.549 m)   Wt 57.2 kg   SpO2 92%   BMI 23.81 kg/m   Intake/Output Summary (Last 24 hours) at 02/19/2023 1147 Last data filed at 02/19/2023 0900 Gross per 24 hour  Intake 1847.39 ml  Output 850 ml  Net 997.39 ml   Weight change:   Physical Exam: GEN lying in bed HEENT anicteric sclera NECK no JVD PULM clear CV RRR ABD soft EXT no LE edema NEURO + garbled speech, but more awake and alert SKIN warm and dry MSK R leg in protector  Imaging: US RENAL  Result Date: 02/17/2023 CLINICAL DATA:  Acute kidney injury EXAM: RENAL / URINARY TRACT ULTRASOUND COMPLETE COMPARISON:  Renal ultrasound 06/13/2017 FINDINGS: Right Kidney: Renal measurements: 7.4 x 3.8 x 6.1 cm = volume: 75 mL. Global atrophy. No collecting system dilatation or perinephric fluid. Left Kidney: Renal measurements: 7.5 x 4.1 x 4.6 cm = volume: 74 mL. Global  volume loss. No collecting system dilatation or perinephric fluid. There is exophytic simple appearing cyst along the inferior aspect of the kidney which is anechoic, smoothly margins and through transmission measuring 7.0 x 5.4 x 6.2 cm previously this measured 7.6 cm. Bladder: Bladder is contracted. Other: None. IMPRESSION: Bilateral renal atrophy. No collecting system dilatation. Simple left-sided renal cyst. Electronically Signed   By: Karen Kays M.D.   On: 02/17/2023 16:01    Labs: BMET Recent Labs  Lab 02/15/23 1730 02/16/23 0823 02/16/23 1708 02/16/23 1951 02/17/23 0753 02/18/23 0634 02/19/23 0542  NA 138 137 139  --  136 138 138  K 5.1 5.3* 5.7* 5.5* 4.4 4.0 3.9  CL 111 109 111  --  104  107 109  CO2 18* 16* 18*  --  23 20* 19*  GLUCOSE 99 88 124*  --  131* 100* 86  BUN 34* 38* 38*  --  39* 37* 34*  CREATININE 1.79* 2.80* 3.09*  --  3.39* 3.22* 2.56*  CALCIUM 8.7* 8.7* 8.5*  --  8.0* 7.7* 7.9*   CBC Recent Labs  Lab 02/16/23 0823 02/17/23 0753 02/17/23 1516 02/18/23 0634 02/19/23 0542  WBC 8.8 8.8  --  9.3 11.0*  HGB 10.4* 6.4* 8.5* 8.4* 8.7*  HCT 32.8* 19.7* 25.5* 26.5* 27.2*  MCV 102.5* 100.5*  --  96.4 95.8  PLT 283 169  --  133* 159    Medications:     sodium chloride   Intravenous Once   acetaminophen  1,000 mg Oral TID   aspirin EC  81 mg Oral BID   cetirizine  10 mg Oral QPM   Chlorhexidine Gluconate Cloth  6 each Topical Q0600   donepezil  5 mg Oral QHS   escitalopram  20 mg Oral Daily   [START ON 02/20/2023] ferrous sulfate  325 mg Oral Q breakfast   levothyroxine  50 mcg Oral Daily   mupirocin ointment  1 Application Nasal BID   timolol  1 drop Both Eyes BID    Bufford Buttner, MD 02/19/2023, 11:47 AM

## 2023-02-19 NOTE — Progress Notes (Signed)
   Palliative Medicine Inpatient Follow Up Note HPI: Patient is a 89 year old female with history of hypertension, hypothyroidism, osteoporosis, CKD stage IV, chronic back pain, dementia who presented with fall from home.  Imaging showed right comminuted femoral neck fracture.  Orthopedics  consulted,S/P ORIF.  Hospital course remarkable for acute blood loss anemia, AKI on CKD.   Palliative care asked to get involved to assist with goals of care conversations.   Today's Discussion 02/19/2023  *Please note that this is a verbal dictation therefore any spelling or grammatical errors are due to the "Dragon Medical One" system interpretation.  Chart reviewed inclusive of vital signs, progress notes, laboratory results, and diagnostic images. Is eating and drinking sparsely have requested nursing to document percentages clearly. Cr is improving.  I met with Laura Ellis at bedside this morning she is awake and alert to self. She does have some grimacing when pressing her right hip - per chart review her pain medication is due.   Per nursing staff patient has been reluctant to to eat/drink. Otherwise they note no significant concerns.  I have called patients daughter, Laura Ellis and left a HIPAA compliant VM, will await call back to provide an update.  Plan for placement at skilled nursing once medically optimized.   Questions and concerns addressed/Palliative Support Provided.   Objective Assessment: Vital Signs Vitals:   02/19/23 0550 02/19/23 0712  BP: (!) 140/54 (!) 141/67  Pulse: 86 (!) 105  Resp: 18 20  Temp: 97.9 F (36.6 C) 98.4 F (36.9 C)  SpO2: 90% 92%    Intake/Output Summary (Last 24 hours) at 02/19/2023 0944 Last data filed at 02/19/2023 0600 Gross per 24 hour  Intake 1727.39 ml  Output 850 ml  Net 877.39 ml   Last Weight  Most recent update: 02/16/2023 12:21 PM    Weight  57.2 kg (126 lb)            Gen:  Frail elderly Caucasian F in NAD HEENT: Dry mucous membranes CV:  Regular rate and rhythm  PULM: On 2LPM [place], breathing is even and nonlabored ABD: soft/nontender  EXT: No edema  Neuro: Disoriented  SUMMARY OF RECOMMENDATIONS   DNAR/DNI   AD's in Vynca   Continue to allow time for outcomes    Ongoing PMT support   Symptoms as below  Symptom Management:  Pain Acute on Chronic: - Tylenol 1G TID - Oxycodone 5mg  PO Q4H PRN - Continue dilaudid 0.5mg  Q3H PRN severe pain  Adult FTT: - Nutrition consult - Provide palatable food options - 1:1 feeding  Weakness: - PT/OT   Encephalopathy: - Strict delirium precautions  Time: 25 ______________________________________________________________________________________ Lamarr Lulas [place] Palliative Medicine Team Team Cell Phone: [phone] Please utilize secure chat with additional questions, if there is no response within 30 minutes please call the above phone number  Palliative Medicine Team providers are available by phone from 7am to 7pm daily and can be reached through the team cell phone.  Should this patient require assistance outside of these hours, please call the patient's attending physician.

## 2023-02-19 NOTE — Progress Notes (Signed)
Physical Therapy Treatment Patient Details Name: Laura Ellis MRN: [id number] DOB: 08/10/1932 Today's Date: 02/19/2023   History of Present Illness Patient is a 89 year old female who presented to [facility] 10/4 with fall from home. Imaging showed right comminuted femoral neck fracture.  Orthopedics consulted, s/p ORIF 02/16/23. Hospital course remarkable for acute blood loss anemia, AKI on CKD. Palliative care, nephrology consulted. PT/OT not consulted until 02/18/23. PMH: hypertension, hypothyroidism, osteoporosis, CKD stage IV, chronic back pain, dementia.    PT Comments  Pt received in supine, agreeable to therapy session and with good participation and improved tolerance for transfer training and supine BLE exercises for ROM/strengthening. Pt with improved good static seated balance and heavy reliance on RW for standing due to c/o RLE pain. Difficulty weight shifting in stance with pt needing up to maxA (+2) for bed mobility and step pivot to chair on her L side. Pt with chair alarm on for safety at end of session. Pt continues to benefit from PT services to progress toward functional mobility goals.    If plan is discharge home, recommend the following: Two people to help with walking and/or transfers;Two people to help with bathing/dressing/bathroom;Assist for transportation;Direct supervision/assist for medications management;Assistance with cooking/housework   Can travel by private vehicle     No  Equipment Recommendations  Other (comment) (TBD)    Recommendations for Other Services       Precautions / Restrictions Precautions Precautions: Fall Precaution Comments: Watch O2 Restrictions Weight Bearing Restrictions: Yes RLE Weight Bearing: Weight bearing as tolerated     Mobility  Bed Mobility Overal bed mobility: Needs Assistance Bed Mobility: Supine to Sit     Supine to sit: Max assist     General bed mobility comments: Cues for technique and use of rails, pt not  able to follow cues for rail use but does assist with moving BLE to EOB and pushing up away from bed to sit on R EOB.    Transfers Overall transfer level: Needs assistance Equipment used: Rolling walker (2 wheels) Transfers: Sit to/from Stand, Bed to chair/wheelchair/BSC Sit to Stand: Mod assist, +2 safety/equipment   Step pivot transfers: Max assist, +2 physical assistance, +2 safety/equipment       General transfer comment: toward chair on her L side, dense cues for safety and manual assist with LLE movement for steps toward chair on her R side due to RLE pain and difficulty WB in SLS on RLE. NT assist on her L side for safety with gait belt.    Ambulation/Gait               General Gait Details: difficulty weight shifting after ~2 steps with heavy assist   Stairs             Wheelchair Mobility     Tilt Bed    Modified Rankin (Stroke Patients Only)       Balance Overall balance assessment: Needs assistance Sitting-balance support: No upper extremity supported Sitting balance-Leahy Scale: Good     Standing balance support: Bilateral upper extremity supported Standing balance-Leahy Scale: Poor Standing balance comment: +1-2 assist for static standing at RW                            Cognition Arousal: Alert Behavior During Therapy: Flat affect, Impulsive Overall Cognitive Status: History of cognitive impairments - at baseline  General Comments: history of dementia with aphasia. Noted word finding difficulty intermittently during session. Pt following simple commands with multimodal cues as able.        Exercises Other Exercises Other Exercises: RLE AAROM: hip abduction, heel slides, ankle pumps Other Exercises: LLE AROM: heel slides, ankel pumps, hip abduction x10 reps ea    General Comments General comments (skin integrity, edema, etc.): dressing appears clean and intact, scant drainage  visible, difficult to fully assess due to linens in the way and pt not understanding why PTA checking under her gown.      Pertinent Vitals/Pain Pain Assessment Pain Assessment: PAINAD Breathing: normal Negative Vocalization: occasional moan/groan, low speech, negative/disapproving quality Facial Expression: facial grimacing Body Language: relaxed Consolability: distracted or reassured by voice/touch PAINAD Score: 4 Pain Location: RLE, appears only minimal pain at rest but increased grimacing/discomfort with WB tasks Pain Descriptors / Indicators: Discomfort, Grimacing, Guarding Pain Intervention(s): Monitored during session, Limited activity within patient's tolerance, Repositioned, Ice applied    Home Living                          Prior Function            PT Goals (current goals can now be found in the care plan section) Acute Rehab PT Goals Patient Stated Goal: not stated PT Goal Formulation: Patient unable to participate in goal setting Time For Goal Achievement: 03/04/23 Progress towards PT goals: Progressing toward goals    Frequency    Min 1X/week      PT Plan      Co-evaluation              AM-PAC PT "6 Clicks" Mobility   Outcome Measure  Help needed turning from your back to your side while in a flat bed without using bedrails?: A Lot Help needed moving from lying on your back to sitting on the side of a flat bed without using bedrails?: A Lot Help needed moving to and from a bed to a chair (including a wheelchair)?: Total Help needed standing up from a chair using your arms (e.g., wheelchair or bedside chair)?: A Lot Help needed to walk in hospital room?: Total Help needed climbing 3-5 steps with a railing? : Total 6 Click Score: 9    End of Session Equipment Utilized During Treatment: Gait belt Activity Tolerance: Patient tolerated treatment well;Patient limited by pain Patient left: in chair;with call bell/phone within reach;with  chair alarm set Nurse Communication: Mobility status;Patient requests pain meds;Other (comment) (ice pack applied to her R hip; pt not agreeable to take bites of food despite encouragement) PT Visit Diagnosis: Other abnormalities of gait and mobility (R26.89);Pain;History of falling (Z91.81) Pain - Right/Left: Right Pain - part of body: Leg     Time: 1610-9604 PT Time Calculation (min) (ACUTE ONLY): 44 min  Charges:    $Therapeutic Exercise: 8-22 mins $Therapeutic Activity: 23-37 mins PT General Charges $$ ACUTE PT VISIT: 1 Visit                     Eily Louvier P., PTA Acute Rehabilitation Services Secure Chat Preferred 9a-5:30pm Office: [phone]    Dorathy Kinsman [facility] 02/19/2023, 5:18 PM

## 2023-02-19 NOTE — Plan of Care (Signed)
  Problem: Education: Goal: Knowledge of General Education information will improve Description: Including pain rating scale, medication(s)/side effects and non-pharmacologic comfort measures Outcome: Progressing   Problem: Activity: Goal: Risk for activity intolerance will decrease Outcome: Progressing   Problem: Nutrition: Goal: Adequate nutrition will be maintained Outcome: Progressing   Problem: Coping: Goal: Level of anxiety will decrease Outcome: Progressing   

## 2023-02-19 NOTE — TOC Progression Note (Signed)
Transition of Care [facility]) - Progression Note    Patient Details  Name: Laura Ellis MRN: [id number] Date of Birth: [birth year]  Transition of Care [facility]) CM/SW Contact  Lorri Frederick, LCSW Phone Number: 02/19/2023, 1:09 PM  Clinical Narrative:   Bed offers provided to daughter Marcelino Duster in room.  She would like to accept offer at [facility].  CSW confirmed with Kelly/Piney Lucas Mallow, likely will be Thursday before bed available.     Expected Discharge Plan: Skilled Nursing Facility Barriers to Discharge: Continued Medical Work up  Expected Discharge Plan and Services   Discharge Planning Services: CM Consult   Living arrangements for the past 2 months: Single Family Home                                       Social Determinants of Health (SDOH) Interventions SDOH Screenings   Food Insecurity: No Food Insecurity (02/15/2023)  Housing: Low Risk  (02/15/2023)  Transportation Needs: No Transportation Needs (02/15/2023)  Utilities: Not At Risk (02/15/2023)  Alcohol Screen: Low Risk  (05/10/2020)  Depression (PHQ2-9): Low Risk  (05/21/2022)  Financial Resource Strain: Low Risk  (01/03/2022)   Received from [facility], [facility]  Physical Activity: Inactive (05/10/2020)  Social Connections: Unknown (09/21/2021)   Received from [facility], [facility]  Stress: No Stress Concern Present (01/03/2022)   [facility]  Tobacco Use: Medium Risk (02/16/2023)    Readmission Risk Interventions     No data to display

## 2023-02-19 NOTE — Progress Notes (Signed)
.  Subjective: 3 Days Post-Op Procedure(s) (LRB): INTRAMEDULLARY (IM) NAIL INTERTROCHANTERIC (Right)   Resting in bed. Baseline mental status. Hgb stable   Activity level:  WBAT Diet tolerance:  as tolerated    Objective: Vital signs in last 24 hours: Temp:  [97.8 F (36.6 C)-98.5 F (36.9 C)] 98.5 F (36.9 C) (10/08 1433) Pulse Rate:  [81-105] 86 (10/08 1433) Resp:  [18-20] 18 (10/08 1433) BP: (133-146)/(54-67) 146/66 (10/08 1433) SpO2:  [90 %-95 %] 95 % (10/08 1433)  Labs: Recent Labs    02/17/23 0753 02/17/23 1516 02/18/23 0634 02/19/23 0542  HGB 6.4* 8.5* 8.4* 8.7*   Recent Labs    02/18/23 0634 02/19/23 0542  WBC 9.3 11.0*  RBC 2.75* 2.84*  HCT 26.5* 27.2*  PLT 133* 159   Recent Labs    02/18/23 0634 02/19/23 0542  NA 138 138  K 4.0 3.9  CL 107 109  CO2 20* 19*  BUN 37* 34*  CREATININE 3.22* 2.56*  GLUCOSE 100* 86  CALCIUM 7.7* 7.9*   No results for input(s): "LABPT", "INR" in the last 72 hours.  Physical Exam:  Neurovascular intact Sensation intact distally Intact pulses distally Dorsiflexion/Plantar flexion intact Incision: dressing C/D/I   Assessment/Plan:   3 Days Post-Op Procedure(s) (LRB): INTRAMEDULLARY (IM) NAIL INTERTROCHANTERIC (Right)   -Hgb stable after transfusion. Anemia is likely due to acute blood loss anemia from fracture/surgery. Continue to trend -CBC and transfuse as necessary -WBAT -PT/OT -Pain control as needed -Rest of management per medical team -Likely discharge planning to SNF      Laura Ellis 02/19/2023, 4:26 PM

## 2023-02-19 NOTE — Progress Notes (Signed)
PROGRESS NOTE  Laura Ellis  ZOX:[id number] DOB: December 17, 1932 DOA: 02/15/2023 PCP: Patient, No Pcp Per   Brief Narrative: Patient is a 89 year old female with history of hypertension, hypothyroidism, osteoporosis, CKD stage IV, chronic back pain, dementia who presented with fall from home.  Imaging showed right comminuted femoral neck fracture.  Orthopedics  consulted,S/P ORIF.  Hospital course remarkable for acute blood loss anemia, AKI on CKD.  Palliative care, nephrology consulted.  PT/OT recommending SNF on discharge   Assessment & Plan:  Principal Problem:   Right femoral fracture (HCC) Active Problems:   Osteoporosis   Essential hypertension   Hypothyroidism   CKD (chronic kidney disease) stage 4, GFR 15-29 ml/min (HCC)   PAD (peripheral artery disease) (HCC)   Gastroesophageal reflux disease without esophagitis   Hyperlipidemia, unspecified   Dementia (HCC)   Right hip fracture: Presented with fall at home.  Imaging showed right comminuted femoral neck fracture.  Status post right hip intramedullary nailing.  PT/OT evaluation done, recommended SNF.  Will start on aspirin 81 mg twice daily for DVT prophylaxis,plan for 4 weeks post op.  CKD stage 3b-4: Baseline creatinine in the range of 1.8.  Kidney function worsened  with creatinine in the range of 3.  This is most likely secondary to hemodynamic changes.  Patient was hypotensive during surgery.  Also has acute blood loss anemia from hip fracture..  Foley has been placed.  Monitor input and output.  Ultrasound of the kidney showed bilateral renal atrophy,no collecting system dilatation.  She follows [facility].  Nephrology consulted and following Continue IV fluid for today, can be given voiding trial tomorrow if further improvement in the kidney function.  Leukocytosis: Presented with leukocytosis, now resolved.  Unclear if she has dysuria because of dementia.  Completed  3 days course of ceftriaxone  Acute blood  loss anemia: From surgery, fracture.  Hemoglobin dropped from 10.4 to 6.4.  Transfused with  PRBC .  Hemoglobin stable in the range of 8 today.Iron level showed very low iron,given a dose of iv iron,continue oral iron on dc  Hypertension: Losartan, metoprolol on hold.  Continue to monitor  Hyperlipidemia: On  statin  Peripheral artery disease: On aspirin at home.  Will resume tomorrow if hemoglobin remains stable  Hypothyroidism: Continue Synthyroid  GERD: Continue PPI  Dementia: Remains confused.  She is HOH .Takes Aricept at home.  Continue delirium precautions, frequent reorientation  Goals of care: Elderly female with dementia now with hip fracture, progressive renal failure.  Palliative care consulted for goals of care.  Currently she is DNR. prognosis guarded. She lives with her daughters.  Needs SNF on discharge.           DVT prophylaxis:SCDs Start: 02/15/23 2207     Code Status: Limited: Do not attempt resuscitation (DNR) -DNR-LIMITED -Do Not Intubate/DNI   Family Communication:Discussed with daughter [name]  on phone on 10/8  Patient status:Inpatient  Patient is from :Home  Anticipated discharge to:SNF  Estimated DC date: after further improvement in the kidney function,, 1 to 2 days   Consultants: Orthopedics, palliative care  Procedures:ORIF  Antimicrobials:  Anti-infectives (From admission, onward)    Start     Dose/Rate Route Frequency Ordered Stop   02/16/23 1800  ceFAZolin (ANCEF) IVPB 1 g/50 mL premix  Status:  Discontinued        1 g 100 mL/hr over 30 Minutes Intravenous Every 8 hours 02/16/23 1511 02/16/23 1511   02/16/23 1600  cefTRIAXone (ROCEPHIN) 1 g  in sodium chloride 0.9 % 100 mL IVPB        1 g 200 mL/hr over 30 Minutes Intravenous Every 24 hours 02/16/23 1244 02/18/23 1922   02/16/23 1400  ceFAZolin (ANCEF) IVPB 1 g/50 mL premix        1 g 100 mL/hr over 30 Minutes Intravenous  Once 02/16/23 1345 02/16/23 1421        Subjective: Patient is and examined the bedside today.  She looks comfortable this morning.  Alert and awake but not oriented.  Obeys commands.  Not in any distress.  But she is having poor oral intake.  Dietitian consulted  Objective: Vitals:   02/18/23 0735 02/18/23 2318 02/19/23 0550 02/19/23 0712  BP: (!) 143/68 133/62 (!) 140/54 (!) 141/67  Pulse: 84 81 86 (!) 105  Resp: 18 18 18 20   Temp: 98 F (36.7 C) 97.8 F (36.6 C) 97.9 F (36.6 C) 98.4 F (36.9 C)  TempSrc: Oral Axillary Axillary Oral  SpO2: 99% 91% 90% 92%  Weight:      Height:        Intake/Output Summary (Last 24 hours) at 02/19/2023 1028 Last data filed at 02/19/2023 0900 Gross per 24 hour  Intake 1847.39 ml  Output 850 ml  Net 997.39 ml   Filed Weights   02/16/23 1220  Weight: 57.2 kg    Examination:  General exam: Overall comfortable, not in distress,weak,deconditioned HEENT: PERRL Respiratory system:  no wheezes or crackles  Cardiovascular system: S1 & S2 heard, RRR.  Gastrointestinal system: Abdomen is nondistended, soft and nontender. Central nervous system: Alert and oriented Extremities: No edema, no clubbing ,no cyanosis, clean surgical wound on the right hip Skin: No rashes, no ulcers,no icterus   GU: Foley   Data Reviewed: I have personally reviewed following labs and imaging studies  CBC: Recent Labs  Lab 02/15/23 1730 02/16/23 0823 02/17/23 0753 02/17/23 1516 02/18/23 0634 02/19/23 0542  WBC 16.5* 8.8 8.8  --  9.3 11.0*  HGB 11.2* 10.4* 6.4* 8.5* 8.4* 8.7*  HCT 35.7* 32.8* 19.7* 25.5* 26.5* 27.2*  MCV 101.7* 102.5* 100.5*  --  96.4 95.8  PLT 288 283 169  --  133* 159   Basic Metabolic Panel: Recent Labs  Lab 02/16/23 0823 02/16/23 1708 02/16/23 1951 02/17/23 0753 02/18/23 0634 02/19/23 0542  NA 137 139  --  136 138 138  K 5.3* 5.7* 5.5* 4.4 4.0 3.9  CL 109 111  --  104 107 109  CO2 16* 18*  --  23 20* 19*  GLUCOSE 88 124*  --  131* 100* 86  BUN 38* 38*  --   39* 37* 34*  CREATININE 2.80* 3.09*  --  3.39* 3.22* 2.56*  CALCIUM 8.7* 8.5*  --  8.0* 7.7* 7.9*     Recent Results (from the past 240 hour(s))  Surgical PCR screen     Status: Abnormal   Collection Time: 02/16/23  5:46 AM   Specimen: Nasal Mucosa; Nasal Swab  Result Value Ref Range Status   MRSA, PCR NEGATIVE NEGATIVE Final   Staphylococcus aureus POSITIVE (A) NEGATIVE Final    Comment: (NOTE) The Xpert SA Assay (FDA approved for NASAL specimens in patients 8 years of age and older), is one component of a comprehensive surveillance program. It is not intended to diagnose infection nor to guide or monitor treatment. Performed at [facility], [address] [place], [place] [address]   Urine Culture (for pregnant, neutropenic or urologic patients  or patients with an indwelling urinary catheter)     Status: Abnormal   Collection Time: 02/17/23  8:10 AM   Specimen: Urine, Catheterized  Result Value Ref Range Status   Specimen Description URINE, CATHETERIZED  Final   Special Requests   Final    NONE Performed at [facility], [address], [place], [place] [address]    Culture 20,000 COLONIES/mL STAPHYLOCOCCUS AUREUS (A)  Final   Report Status 02/19/2023 FINAL  Final   Organism ID, Bacteria STAPHYLOCOCCUS AUREUS (A)  Final      Susceptibility   Staphylococcus aureus - MIC*    CIPROFLOXACIN <=0.5 SENSITIVE Sensitive     GENTAMICIN <=0.5 SENSITIVE Sensitive     NITROFURANTOIN <=16 SENSITIVE Sensitive     OXACILLIN 0.5 SENSITIVE Sensitive     TETRACYCLINE <=1 SENSITIVE Sensitive     VANCOMYCIN <=0.5 SENSITIVE Sensitive     TRIMETH/SULFA <=10 SENSITIVE Sensitive     CLINDAMYCIN RESISTANT Resistant     RIFAMPIN <=0.5 SENSITIVE Sensitive     Inducible Clindamycin POSITIVE Resistant     LINEZOLID 2 SENSITIVE Sensitive     * 20,000 COLONIES/mL STAPHYLOCOCCUS AUREUS     Radiology Studies: US RENAL  Result Date: 02/17/2023 CLINICAL DATA:  Acute kidney injury  EXAM: RENAL / URINARY TRACT ULTRASOUND COMPLETE COMPARISON:  Renal ultrasound 06/13/2017 FINDINGS: Right Kidney: Renal measurements: 7.4 x 3.8 x 6.1 cm = volume: 75 mL. Global atrophy. No collecting system dilatation or perinephric fluid. Left Kidney: Renal measurements: 7.5 x 4.1 x 4.6 cm = volume: 74 mL. Global volume loss. No collecting system dilatation or perinephric fluid. There is exophytic simple appearing cyst along the inferior aspect of the kidney which is anechoic, smoothly margins and through transmission measuring 7.0 x 5.4 x 6.2 cm previously this measured 7.6 cm. Bladder: Bladder is contracted. Other: None. IMPRESSION: Bilateral renal atrophy. No collecting system dilatation. Simple left-sided renal cyst. Electronically Signed   By: Karen Kays M.D.   On: 02/17/2023 16:01    Scheduled Meds:  sodium chloride   Intravenous Once   acetaminophen  1,000 mg Oral TID   cetirizine  10 mg Oral QPM   Chlorhexidine Gluconate Cloth  6 each Topical Q0600   donepezil  5 mg Oral QHS   escitalopram  20 mg Oral Daily   levothyroxine  50 mcg Oral Daily   midodrine  5 mg Oral TID WC   mupirocin ointment  1 Application Nasal BID   timolol  1 drop Both Eyes BID   Continuous Infusions:  sodium chloride 75 mL/hr at 02/18/23 1017     LOS: 4 days   Burnadette Pop, MD [facility] P10/12/2022, 10:28 AM

## 2023-02-20 DIAGNOSIS — S7291XA Unspecified fracture of right femur, initial encounter for closed fracture: Secondary | ICD-10-CM

## 2023-02-20 DIAGNOSIS — Z7189 Other specified counseling: Secondary | ICD-10-CM | POA: Diagnosis not present

## 2023-02-20 DIAGNOSIS — Z515 Encounter for palliative care: Secondary | ICD-10-CM | POA: Diagnosis not present

## 2023-02-20 LAB — BASIC METABOLIC PANEL
Anion gap: 13 (ref 5–15)
BUN: 32 mg/dL — ABNORMAL HIGH (ref 8–23)
CO2: 21 mmol/L — ABNORMAL LOW (ref 22–32)
Calcium: 8.5 mg/dL — ABNORMAL LOW (ref 8.9–10.3)
Chloride: 105 mmol/L (ref 98–111)
Creatinine, Ser: 2.17 mg/dL — ABNORMAL HIGH (ref 0.44–1.00)
GFR, Estimated: 22 mL/min — ABNORMAL LOW (ref >60)
Glucose, Bld: 95 mg/dL (ref 70–99)
Potassium: 3.9 mmol/L (ref 3.5–5.1)
Sodium: 139 mmol/L (ref 135–145)

## 2023-02-20 LAB — CBC
HCT: 31.1 % — ABNORMAL LOW (ref 36.0–46.0)
Hemoglobin: 10.2 g/dL — ABNORMAL LOW (ref 12.0–15.0)
MCH: 31.5 pg (ref 26.0–34.0)
MCHC: 32.8 g/dL (ref 30.0–36.0)
MCV: 96 fL (ref 80.0–100.0)
Platelets: 235 10*3/uL (ref 150–400)
RBC: 3.24 MIL/uL — ABNORMAL LOW (ref 3.87–5.11)
RDW: 14.1 % (ref 11.5–15.5)
WBC: 12.6 10*3/uL — ABNORMAL HIGH (ref 4.0–10.5)
nRBC: 0 % (ref 0.0–0.2)

## 2023-02-20 LAB — VITAMIN D 25 HYDROXY (VIT D DEFICIENCY, FRACTURES): Vit D, 25-Hydroxy: 24.56 ng/mL — ABNORMAL LOW (ref 30–100)

## 2023-02-20 MED ORDER — POLYETHYLENE GLYCOL 3350 17 G PO PACK: 17.0000 g | PACK | Freq: Every day | ORAL | Status: DC | PRN

## 2023-02-20 MED ORDER — SENNOSIDES-DOCUSATE SODIUM 8.6-50 MG PO TABS
1.0000 | ORAL_TABLET | Freq: Two times a day (BID) | ORAL | Status: DC
  Administered 2023-02-20 – 2023-02-22 (×3): 1 via ORAL
  Filled 2023-02-20 (×4): qty 1

## 2023-02-20 MED ORDER — AMLODIPINE BESYLATE 5 MG PO TABS
5.0000 mg | ORAL_TABLET | Freq: Every day | ORAL | Status: DC
  Administered 2023-02-20 – 2023-02-22 (×2): 5 mg via ORAL
  Filled 2023-02-20 (×3): qty 1

## 2023-02-20 NOTE — Progress Notes (Signed)
   Palliative Medicine Inpatient Follow Up Note HPI: Patient is a 89 year old female with history of hypertension, hypothyroidism, osteoporosis, CKD stage IV, chronic back pain, dementia who presented with fall from home.  Imaging showed right comminuted femoral neck fracture.  Orthopedics  consulted,S/P ORIF.  Hospital course remarkable for acute blood loss anemia, AKI on CKD.   Palliative care asked to get involved to assist with goals of care conversations.   Today's Discussion 02/20/2023  *Please note that this is a verbal dictation therefore any spelling or grammatical errors are due to the "Dragon Medical One" system interpretation.  Chart reviewed inclusive of vital signs, progress notes, laboratory results, and diagnostic images. Cr continues to downtrend post-op.   I met with Cherylann Ratel at bedside. She is alert and oriented to self. She denies pain, nausea, or shortness of breath this morning.   No family at bedside this morning.   Plan for placement - short term for rehabilitation.   Questions and concerns addressed/Palliative Support Provided.   Objective Assessment: Vital Signs Vitals:   02/20/23 0455 02/20/23 0739  BP: (!) 175/83 (!) 161/78  Pulse: 83 91  Resp: 18 16  Temp: 98.3 F (36.8 C) 98.1 F (36.7 C)  SpO2: 91% 93%    Intake/Output Summary (Last 24 hours) at 02/20/2023 1204 Last data filed at 02/20/2023 1100 Gross per 24 hour  Intake 240 ml  Output 500 ml  Net -260 ml   Last Weight  Most recent update: 02/16/2023 12:21 PM    Weight  57.2 kg (126 lb)            Gen:  Frail elderly Caucasian F in NAD HEENT: Dry mucous membranes CV: Regular rate and rhythm  PULM: On 2LPM [place], breathing is even and nonlabored ABD: soft/nontender  EXT: No edema  Neuro: Disoriented  SUMMARY OF RECOMMENDATIONS   DNAR/DNI   AD's in Vynca   Continue to allow time for outcomes   Plan for short term rehabilitation   Ongoing PMT support   Symptoms as below  Symptom  Management:  Pain Acute on Chronic: - Tylenol 1G TID - Oxycodone 5mg  PO Q4H PRN - Continue dilaudid 0.5mg  Q3H PRN severe pain  Adult FTT: - Nutrition consult - Provide palatable food options - 1:1 feeding  Weakness: - PT/OT   Encephalopathy: - Strict delirium precautions  Time: 25 ______________________________________________________________________________________ Lamarr Lulas [place] Palliative Medicine Team Team Cell Phone: [phone] Please utilize secure chat with additional questions, if there is no response within 30 minutes please call the above phone number  Palliative Medicine Team providers are available by phone from 7am to 7pm daily and can be reached through the team cell phone.  Should this patient require assistance outside of these hours, please call the patient's attending physician.

## 2023-02-20 NOTE — Progress Notes (Signed)
PROGRESS NOTE    ROSEA Ellis  NGE:[id number] DOB: 11-Oct-1932 DOA: 02/15/2023 PCP: Patient, No Pcp Per    Brief Narrative:   Laura Ellis is a 89 y.o. female with past medical history significant for HTN, hypothyroidism, CKD stage IV, chronic back pain, osteoporosis, dementia who presented to [facility] ED on 10/4 from home with right sided hip pain after fall at home.  No loss of consciousness reported.  Workup in the ED notable for right comminuted femoral neck fracture.  Orthopedics was consulted underwent ORIF on 02/16/2023 by Dr. Hulda Humphrey.  Hospital course complicated by acute blood loss anemia, acute renal failure on CKD stage IV.  Seen by palliative care, nephrology.  Pending SNF discharge.  Assessment & Plan:   Right comminuted femoral neck fracture Patient presenting from home following mechanical fall with associated acute right hip pain and inability to ambulate.  No loss of consciousness.  Imaging notable for right comminuted femoral neck fracture.  Orthopedics was consulted and patient underwent ORIF with IMN on 02/16/2023 by Dr. Hulda Humphrey. -- Vitamin D 25-hydroxy level: Pending -- WBAT RLE -- Aspirin 81 mg p.o. twice daily for DVT prophylaxis per orthopedics x 6 weeks -- Oxycodone 5 g p.o. every 4 hours as needed moderate pain -- Outpatient follow-up with orthopedics, 2 weeks for wound check and repeat x-ray -- Pending SNF placement  Acute blood loss anemia Iron deficiency anemia In the setting of large bone fracture and surgery.  Anemia panel with iron 11, TIBC 162, ferritin 186.  -- hgb 10.4>>6.4>8.5>>8.7>10.2 -- s/p 2 unit PRBC -- Ferrous sulfate 325 mg p.o. daily  Acute renal failure on CKD stage IV Baseline creatinine 1.8.  Creatinine trended up during hospitalization likely secondary to hemodynamic compromise with decreased effective arterial blood flow.  Renal ultrasound showing smaller kidneys than 5 years ago.  Seen by nephrology with recommendation of maintaining blood  pressure greater than 110/60; supportive care with transfusion and IV fluid hydration as needed. -- Cr  1.79>2.80>>3.39>3.22>2.56>2.17 -- BMP daily  Leukocytosis Patient presenting with elevated WBC count.  Unclear if from hemoconcentration from dehydration versus UTI given unable to communicate due to underlying dementia.  Completed 3-day course of ceftriaxone.  Remains afebrile.  Essential hypertension -- Restart home amlodipine 5 mg p.o. daily today -- Continue to hold home losartan  Hypothyroidism -- Levothyroxine 50 mcg p.o. daily  Dementia Hard of hearing --Delirium precautions --Get up during the day --Encourage a familiar face to remain present throughout the day --Keep blinds open and lights on during daylight hours --Minimize the use of opioids/benzodiazepines -- Donepezil 5 mg p.o. nightly -- Zyprexa 2.5 mg p.o. twice daily as needed agitation  Depression/anxiety: -- Lexapro 10 mg p.o. daily -- Valium 1-2 mg p.o. daily as needed anxiety  Adult failure to thrive Continue to encourage increased oral intake, needs assistance with feeding.  Goals of care Elderly female with dementia now with hip fracture, progressive renal failure. Currently DNR; overall prognosis remains guarded. She lives with her daughters.  Needs SNF on discharge.  Seen by palliative care.   DVT prophylaxis: SCDs Start: 02/15/23 2207    Code Status: Limited: Do not attempt resuscitation (DNR) -DNR-LIMITED -Do Not Intubate/DNI  Family Communication: No family present bedside this morning  Disposition Plan:  Level of care: Med-Surg Status is: Inpatient Remains inpatient appropriate because: Pending SNF placement, likely discharge to [facility] 10/10    Consultants:  Orthopedics Neurology Palliative care  Procedures:  ORIF right femur fracture, Dr. Hulda Humphrey; 10/5  Antimicrobials:  Ceftriaxone 10/5 - 10/7 Perioperative cefazolin 10/5   Subjective: Patient seen examined bedside, resting  company.  Lying in bed.  Remains pleasantly confused.  No family present.  Denies pain, unable to obtain any further ROS due to underlying dementia.  No acute events overnight per nursing staff.  Objective: Vitals:   02/19/23 1433 02/19/23 2013 02/20/23 0455 02/20/23 0739  BP: (!) 146/66 (!) 169/75 (!) 175/83 (!) 161/78  Pulse: 86 100 83 91  Resp: 18 18 18 16   Temp: 98.5 F (36.9 C) 98.4 F (36.9 C) 98.3 F (36.8 C) 98.1 F (36.7 C)  TempSrc: Oral Oral  Oral  SpO2: 95% 93% 91% 93%  Weight:      Height:        Intake/Output Summary (Last 24 hours) at 02/20/2023 1341 Last data filed at 02/20/2023 1100 Gross per 24 hour  Intake 120 ml  Output 200 ml  Net -80 ml   Filed Weights   02/16/23 1220  Weight: 57.2 kg    Examination:  Physical Exam: GEN: NAD, alert and oriented to self, otherwise pleasantly confused, elderly in appearance, hard of hearing HEENT: NCAT, PERRL, EOMI, sclera clear, MMM PULM: CTAB w/o wheezes/crackles, normal respiratory effort, on room air CV: RRR w/o M/G/R GI: abd soft, NTND, NABS, no R/G/M MSK: no peripheral edema, moves all extremities independently NEURO: No focal neurological deficits PSYCH: normal mood/affect Integumentary: Surgical incision site noted with dressing in place, clean/dry/intact; otherwise no other concerning rashes/lesions/wounds noted on exposed skin surfaces.    Data Reviewed: I have personally reviewed following labs and imaging studies  CBC: Recent Labs  Lab 02/16/23 0823 02/17/23 0753 02/17/23 1516 02/18/23 0634 02/19/23 0542 02/20/23 0605  WBC 8.8 8.8  --  9.3 11.0* 12.6*  HGB 10.4* 6.4* 8.5* 8.4* 8.7* 10.2*  HCT 32.8* 19.7* 25.5* 26.5* 27.2* 31.1*  MCV 102.5* 100.5*  --  96.4 95.8 96.0  PLT 283 169  --  133* 159 235   Basic Metabolic Panel: Recent Labs  Lab 02/16/23 1708 02/16/23 1951 02/17/23 0753 02/18/23 0634 02/19/23 0542 02/20/23 0605  NA 139  --  136 138 138 139  K 5.7* 5.5* 4.4 4.0 3.9 3.9  CL  111  --  104 107 109 105  CO2 18*  --  23 20* 19* 21*  GLUCOSE 124*  --  131* 100* 86 95  BUN 38*  --  39* 37* 34* 32*  CREATININE 3.09*  --  3.39* 3.22* 2.56* 2.17*  CALCIUM 8.5*  --  8.0* 7.7* 7.9* 8.5*   GFR: Estimated Creatinine Clearance: 13.8 mL/min (A) (by C-G formula based on SCr of 2.17 mg/dL (H)). Liver Function Tests: No results for input(s): "AST", "ALT", "ALKPHOS", "BILITOT", "PROT", "ALBUMIN" in the last 168 hours. No results for input(s): "LIPASE", "AMYLASE" in the last 168 hours. No results for input(s): "AMMONIA" in the last 168 hours. Coagulation Profile: No results for input(s): "INR", "PROTIME" in the last 168 hours. Cardiac Enzymes: No results for input(s): "CKTOTAL", "CKMB", "CKMBINDEX", "TROPONINI" in the last 168 hours. BNP (last 3 results) No results for input(s): "PROBNP" in the last 8760 hours. HbA1C: No results for input(s): "HGBA1C" in the last 72 hours. CBG: No results for input(s): "GLUCAP" in the last 168 hours. Lipid Profile: No results for input(s): "CHOL", "HDL", "LDLCALC", "TRIG", "CHOLHDL", "LDLDIRECT" in the last 72 hours. Thyroid Function Tests: No results for input(s): "TSH", "T4TOTAL", "FREET4", "T3FREE", "THYROIDAB" in the last 72 hours. Anemia Panel: Recent Labs  02/18/23 0634  FERRITIN 186  TIBC 162*  IRON 11*   Sepsis Labs: No results for input(s): "PROCALCITON", "LATICACIDVEN" in the last 168 hours.  Recent Results (from the past 240 hour(s))  Surgical PCR screen     Status: Abnormal   Collection Time: 02/16/23  5:46 AM   Specimen: Nasal Mucosa; Nasal Swab  Result Value Ref Range Status   MRSA, PCR NEGATIVE NEGATIVE Final   Staphylococcus aureus POSITIVE (A) NEGATIVE Final    Comment: (NOTE) The Xpert SA Assay (FDA approved for NASAL specimens in patients 71 years of age and older), is one component of a comprehensive surveillance program. It is not intended to diagnose infection nor to guide or monitor  treatment. Performed at [facility], [address], [place], [place] [address]   Urine Culture (for pregnant, neutropenic or urologic patients or patients with an indwelling urinary catheter)     Status: Abnormal   Collection Time: 02/17/23  8:10 AM   Specimen: Urine, Catheterized  Result Value Ref Range Status   Specimen Description URINE, CATHETERIZED  Final   Special Requests   Final    NONE Performed at [facility], [address] [place], [place] [address]    Culture 20,000 COLONIES/mL STAPHYLOCOCCUS AUREUS (A)  Final   Report Status 02/19/2023 FINAL  Final   Organism ID, Bacteria STAPHYLOCOCCUS AUREUS (A)  Final      Susceptibility   Staphylococcus aureus - MIC*    CIPROFLOXACIN <=0.5 SENSITIVE Sensitive     GENTAMICIN <=0.5 SENSITIVE Sensitive     NITROFURANTOIN <=16 SENSITIVE Sensitive     OXACILLIN 0.5 SENSITIVE Sensitive     TETRACYCLINE <=1 SENSITIVE Sensitive     VANCOMYCIN <=0.5 SENSITIVE Sensitive     TRIMETH/SULFA <=10 SENSITIVE Sensitive     CLINDAMYCIN RESISTANT Resistant     RIFAMPIN <=0.5 SENSITIVE Sensitive     Inducible Clindamycin POSITIVE Resistant     LINEZOLID 2 SENSITIVE Sensitive     * 20,000 COLONIES/mL STAPHYLOCOCCUS AUREUS         Radiology Studies: No results found.      Scheduled Meds:  sodium chloride   Intravenous Once   acetaminophen  1,000 mg Oral TID   aspirin EC  81 mg Oral BID   cetirizine  10 mg Oral QPM   Chlorhexidine Gluconate Cloth  6 each Topical Q0600   donepezil  5 mg Oral QHS   escitalopram  20 mg Oral Daily   ferrous sulfate  325 mg Oral Q breakfast   levothyroxine  50 mcg Oral Daily   mupirocin ointment  1 Application Nasal BID   timolol  1 drop Both Eyes BID   Continuous Infusions:   LOS: 5 days    Time spent: 56 minutes spent on chart review, discussion with nursing staff, consultants, updating family and interview/physical exam; more than 50% of that time was spent in counseling and/or  coordination of care.    Alvira Philips [place], DO [facility] Available via Epic secure chat 7am-7pm After these hours, please refer to coverage provider listed on [email or web address] 02/20/2023, 1:41 PM

## 2023-02-20 NOTE — Progress Notes (Signed)
Initial Nutrition Assessment  DOCUMENTATION CODES:   Not applicable  INTERVENTION:  Continue regular diet as ordered; automatic trays Assistance with tray set up/feeding as needed Mighty Shake TID with meals, each supplement provides 330 kcals and 9 grams of protein Magic cup TID with meals, each supplement provides 290 kcal and 9 grams of protein "High calorie, high protein nutrition therapy" handout added to AVS Recommend bowel regimen; discussed with MD  NUTRITION DIAGNOSIS:   Increased nutrient needs related to post-op healing, hip fracture as evidenced by estimated needs.  GOAL:   Patient will meet greater than or equal to 90% of their needs  MONITOR:   PO intake, Supplement acceptance, Labs, Weight trends  REASON FOR ASSESSMENT:   Consult Assessment of nutrition requirement/status  ASSESSMENT:   Pt admitted with R femoral neck fracture after a fall at home. PMH significant for HTN, hypothyroidism, osteoporosis, CKD stage IV, chronic back pain, dementia.  10/5: s/p R hip IM nailing   Admission c/b ABLA, AKI on CKD.  PMT following for GOC. Pending SNF placement.   Met with pt at bedside. She is very pleasant. Unable to provide nutrition related history at this time given underlying dementia. No family present at bedside.  Meal completions: 10/8: 50% breakfast, 25% lunch, 25% dinner 10/9: 50% breakfast  Unfortunately, there is limited weight history on file to review over the last year. Suspect admission weight to be stated/pulled from prior weight documentation as weight is consistent with that of last documented 57.2 kg on 01/03/22. Will request updated measured weight to review.   Will place order for nutrition supplements to be delivered with meal trays to promote increased PO intake as tolerated. High calorie, high protein nutrition therapy handout added to AVS for post discharge reference.   Medications: ferrous sulfate  Labs: BUN 32, Cr 2.17, GFR 22,  iron 11  NUTRITION - FOCUSED PHYSICAL EXAM:  Flowsheet Row Most Recent Value  Orbital Region No depletion  Upper Arm Region No depletion  Thoracic and Lumbar Region No depletion  Buccal Region Mild depletion  Temple Region Mild depletion  Clavicle Bone Region Mild depletion  Clavicle and Acromion Bone Region No depletion  Scapular Bone Region No depletion  Dorsal Hand Severe depletion  Patellar Region Moderate depletion  Anterior Thigh Region Moderate depletion  Posterior Calf Region Severe depletion  Edema (RD Assessment) None  Hair Reviewed  Eyes Reviewed  Mouth Reviewed  Skin Reviewed  Nails Reviewed       Diet Order:   Diet Order             Diet regular Room service appropriate? Yes; Fluid consistency: Thin  Diet effective now                   EDUCATION NEEDS:   No education needs have been identified at this time  Skin:  Skin Assessment: Reviewed RN Assessment (R hip closed incision)  Last BM:  10/9 (type 6 smear)  Height:   Ht Readings from Last 1 Encounters:  02/16/23 5\' 1"  (1.549 m)    Weight:   Wt Readings from Last 1 Encounters:  02/16/23 57.2 kg   BMI:  Body mass index is 23.81 kg/m.  Estimated Nutritional Needs:   Kcal:  1400-1600  Protein:  70-80g  Fluid:  >/=1.5L  Drusilla Kanner, RDN, LDN Clinical Nutrition

## 2023-02-20 NOTE — TOC Progression Note (Addendum)
Transition of Care [facility]) - Progression Note    Patient Details  Name: Laura Ellis MRN: [id number] Date of Birth: 1933-04-24  Transition of Care [facility]) CM/SW Contact  Lorri Frederick, LCSW Phone Number: 02/20/2023, 10:41 AM  Clinical Narrative:   CSW informed by Laney Pastor that they DC they were planning on for tomorrow has been cancelled, no available bed, none anticipated until next week.  CSW spoke with pt daughter Marcelino Duster, updated her on the above, discussed other bed offers.  She will speak with her sister and call back.   1130: message from [place], would like to accept offer at [facility].  CSW confirmed with [facility]: they can receive pt tomorrow.  SNF auth request submitted in [place] and approved: [id number], 5 days: 10/10-10/14.   Expected Discharge Plan: Skilled Nursing Facility Barriers to Discharge: Continued Medical Work up  Expected Discharge Plan and Services   Discharge Planning Services: CM Consult   Living arrangements for the past 2 months: Single Family Home                                       Social Determinants of Health (SDOH) Interventions SDOH Screenings   Food Insecurity: No Food Insecurity (02/15/2023)  Housing: Low Risk  (02/15/2023)  Transportation Needs: No Transportation Needs (02/15/2023)  Utilities: Not At Risk (02/15/2023)  Alcohol Screen: Low Risk  (05/10/2020)  Depression (PHQ2-9): Low Risk  (05/21/2022)  Financial Resource Strain: Low Risk  (01/03/2022)   Received from [facility], [facility]  Physical Activity: Inactive (05/10/2020)  Social Connections: Unknown (09/21/2021)   Received from [facility], [facility]  Stress: No Stress Concern Present (01/03/2022)   Received from [facility], [facility]  Tobacco Use: Medium Risk (02/16/2023)    Readmission Risk Interventions     No data to display

## 2023-02-21 DIAGNOSIS — S728X1A Other fracture of right femur, initial encounter for closed fracture: Secondary | ICD-10-CM

## 2023-02-21 LAB — BASIC METABOLIC PANEL
Anion gap: 16 — ABNORMAL HIGH (ref 5–15)
BUN: 36 mg/dL — ABNORMAL HIGH (ref 8–23)
CO2: 20 mmol/L — ABNORMAL LOW (ref 22–32)
Calcium: 8.4 mg/dL — ABNORMAL LOW (ref 8.9–10.3)
Chloride: 102 mmol/L (ref 98–111)
Creatinine, Ser: 2.03 mg/dL — ABNORMAL HIGH (ref 0.44–1.00)
GFR, Estimated: 23 mL/min — ABNORMAL LOW (ref >60)
Glucose, Bld: 82 mg/dL (ref 70–99)
Potassium: 3.5 mmol/L (ref 3.5–5.1)
Sodium: 138 mmol/L (ref 135–145)

## 2023-02-21 LAB — CBC
HCT: 29.9 % — ABNORMAL LOW (ref 36.0–46.0)
Hemoglobin: 10.2 g/dL — ABNORMAL LOW (ref 12.0–15.0)
MCH: 32.1 pg (ref 26.0–34.0)
MCHC: 34.1 g/dL (ref 30.0–36.0)
MCV: 94 fL (ref 80.0–100.0)
Platelets: 256 10*3/uL (ref 150–400)
RBC: 3.18 MIL/uL — ABNORMAL LOW (ref 3.87–5.11)
RDW: 14 % (ref 11.5–15.5)
WBC: 12.3 10*3/uL — ABNORMAL HIGH (ref 4.0–10.5)
nRBC: 0 % (ref 0.0–0.2)

## 2023-02-21 MED ORDER — HYDRALAZINE HCL 20 MG/ML IJ SOLN: 10.0000 mg | Freq: Four times a day (QID) | INTRAMUSCULAR | Status: DC | PRN

## 2023-02-21 MED ORDER — ACETAMINOPHEN 325 MG PO TABS: 650.0000 mg | ORAL_TABLET | Freq: Four times a day (QID) | ORAL | Status: DC | PRN

## 2023-02-21 MED ORDER — VITAMIN D 25 MCG (1000 UNIT) PO TABS
1000.0000 [IU] | ORAL_TABLET | Freq: Every day | ORAL | Status: DC
  Filled 2023-02-21 (×2): qty 1

## 2023-02-21 NOTE — Progress Notes (Signed)
Occupational Therapy Treatment Patient Details Name: Laura Ellis MRN: [id number] DOB: 03/02/1933 Today's Date: 02/21/2023   History of present illness Patient is a 89 year old female who [facility] with [facility] from home. Imaging showed right comminuted femoral neck fracture.  Orthopedics consulted, s/p ORIF 02/16/23. Hospital course remarkable for acute blood loss anemia, AKI on CKD. Palliative care, nephrology consulted. PT/OT not consulted until 02/18/23. PMH: hypertension, hypothyroidism, osteoporosis, CKD stage IV, chronic back pain, dementia.   OT comments  OT assisted to reposition in bed total assist, chair position for ADL participation.  Pt resistive to ADL engagement, hand over hand for washing hands with very limited carryover and resistive to support to comb hair. She is following some simple 1 step commands but requires increased time and multimodal cueing.  Limited session, may do better with family present next session.  Will follow acutely.       If plan is discharge home, recommend the following:  Assist for transportation;Assistance with cooking/housework;Two people to help with walking and/or transfers;A lot of help with bathing/dressing/bathroom;Direct supervision/assist for financial management;Direct supervision/assist for medications management   Equipment Recommendations  None recommended by OT    Recommendations for Other Services      Precautions / Restrictions Precautions Precautions: Fall Precaution Comments: Watch O2 Restrictions Weight Bearing Restrictions: Yes RLE Weight Bearing: Weight bearing as tolerated       Mobility Bed Mobility Overal bed mobility: Needs Assistance             General bed mobility comments: repositioned in bed with total assist, bed to chair position    Transfers                         Balance                                           ADL either performed or assessed  with clinical judgement   ADL Overall ADL's : Needs assistance/impaired     Grooming: Wash/dry hands;Brushing hair;Sitting;Maximal assistance Grooming Details (indicate cue type and reason): chair position in bed, hand over hand assist required with very limited carryover.  Resistive to comb hair.             Lower Body Dressing: Total assistance;Bed level     Toilet Transfer Details (indicate cue type and reason): deferred         Functional mobility during ADLs: Total assistance      Extremity/Trunk Assessment              Vision       Perception     Praxis      Cognition Arousal: Alert Behavior During Therapy: Flat affect Overall Cognitive Status: History of cognitive impairments - at baseline                                 General Comments: pt wit hx of dementia with aphasia.  difficulty with word finding, following some simple commands with increased time and mulitmodal cueing.  Pt resistive to ADLs today.        Exercises      Shoulder Instructions       General Comments      Pertinent Vitals/ Pain       Pain Assessment Pain  Assessment: Faces Faces Pain Scale: Hurts little more Pain Location: grimacing with movement, anticipate R LE Pain Descriptors / Indicators: Discomfort, Grimacing, Guarding Pain Intervention(s): Limited activity within patient's tolerance, Monitored during session, Repositioned  Home Living                                          Prior Functioning/Environment              Frequency  Min 1X/week        Progress Toward Goals  OT Goals(current goals can now be found in the care plan section)  Progress towards OT goals: Not progressing toward goals - comment;OT to reassess next treatment (pt with limited engagement, resistive to ADL participation today)  Acute Rehab OT Goals Patient Stated Goal: none stated OT Goal Formulation: Patient unable to participate in goal  setting Time For Goal Achievement: 03/04/23 Potential to Achieve Goals: Fair  Plan      Co-evaluation                 AM-PAC OT "6 Clicks" Daily Activity     Outcome Measure   Help from another person eating meals?: A Little Help from another person taking care of personal grooming?: A Lot Help from another person toileting, which includes using toliet, bedpan, or urinal?: A Lot Help from another person bathing (including washing, rinsing, drying)?: A Lot Help from another person to put on and taking off regular upper body clothing?: A Lot Help from another person to put on and taking off regular lower body clothing?: Total 6 Click Score: 12    End of Session    OT Visit Diagnosis: Unsteadiness on feet (R26.81);Pain;Other symptoms and signs involving cognitive function Pain - Right/Left: Right Pain - part of body: Leg   Activity Tolerance Patient limited by pain;Other (comment) (cognition)   Patient Left in bed;with call bell/phone within reach;with bed alarm set   Nurse Communication Mobility status        Time: 3875-6433 OT Time Calculation (min): 14 min  Charges: OT General Charges $OT Visit: 1 Visit OT Treatments $Self Care/Home Management : 8-22 mins  Barry Brunner, OT Acute Rehabilitation Services Office [phone]   Chancy Milroy 02/21/2023, 1:10 PM

## 2023-02-21 NOTE — Progress Notes (Signed)
PROGRESS NOTE    Laura Ellis  UJW:[id number] DOB: 05-17-1932 DOA: 02/15/2023 PCP: Patient, No Pcp Per    Brief Narrative:   Laura Ellis is a 89 y.o. female with past medical history significant for HTN, hypothyroidism, CKD stage IV, chronic back pain, osteoporosis, dementia who presented to [facility] ED on 10/4 from home with right sided hip pain after fall at home.  No loss of consciousness reported.  Workup in the ED notable for right comminuted femoral neck fracture.  Orthopedics was consulted underwent ORIF on 02/16/2023 by Dr. Hulda Humphrey.  Hospital course complicated by acute blood loss anemia, acute renal failure on CKD stage IV.  Seen by palliative care, nephrology.  Pending SNF discharge.  Assessment & Plan:   Right comminuted femoral neck fracture Patient presenting from home following mechanical fall with associated acute right hip pain and inability to ambulate.  No loss of consciousness.  Imaging notable for right comminuted femoral neck fracture.  Orthopedics was consulted and patient underwent ORIF with IMN on 02/16/2023 by Dr. Hulda Humphrey. -- Vitamin D 25-hydroxy level: Pending -- WBAT RLE -- Aspirin 81 mg p.o. twice daily for DVT prophylaxis per orthopedics x 6 weeks -- Oxycodone 5 g p.o. every 4 hours as needed moderate pain -- Outpatient follow-up with orthopedics, 2 weeks for wound check and repeat x-ray -- Pending SNF placement  Acute blood loss anemia Iron deficiency anemia In the setting of large bone fracture and surgery.  Anemia panel with iron 11, TIBC 162, ferritin 186.  -- hgb 10.4>>6.4>8.5>>8.7>10.2>10.2 -- s/p 2 unit PRBC -- Ferrous sulfate 325 mg p.o. daily  Acute renal failure on CKD stage IV Baseline creatinine 1.8.  Creatinine trended up during hospitalization likely secondary to hemodynamic compromise with decreased effective arterial blood flow.  Renal ultrasound showing smaller kidneys than 5 years ago.  Seen by nephrology with recommendation of maintaining blood  pressure greater than 110/60; supportive care with transfusion and IV fluid hydration as needed. -- Cr  1.79>2.80>>3.39>3.22>2.56>2.17>2.03 -- BMP daily  Leukocytosis Patient presenting with elevated WBC count.  Unclear if from hemoconcentration from dehydration versus UTI given unable to communicate due to underlying dementia.  Completed 3-day course of ceftriaxone.  Remains afebrile.  Essential hypertension -- amlodipine 5 mg p.o. daily  -- Continue to hold home losartan  Hypothyroidism -- Levothyroxine 50 mcg p.o. daily  Dementia Hard of hearing --Delirium precautions --Get up during the day --Encourage a familiar face to remain present throughout the day --Keep blinds open and lights on during daylight hours --Minimize the use of opioids/benzodiazepines -- Donepezil 5 mg p.o. nightly -- Zyprexa 2.5 mg p.o. twice daily as needed agitation  Depression/anxiety: -- Lexapro 10 mg p.o. daily -- Valium 1-2 mg p.o. daily as needed anxiety  Adult failure to thrive Continue to encourage increased oral intake, needs assistance with feeding.  Goals of care Elderly female with dementia now with hip fracture, progressive renal failure. Currently DNR; overall prognosis remains guarded. She lives with her daughters.  Needs SNF on discharge.  Seen by palliative care.   DVT prophylaxis: SCDs Start: 02/15/23 2207    Code Status: Limited: Do not attempt resuscitation (DNR) -DNR-LIMITED -Do Not Intubate/DNI  Family Communication: No family present bedside this morning  Disposition Plan:  Level of care: Med-Surg Status is: Inpatient Remains inpatient appropriate because: Pending SNF placement, likely discharge to [facility] 10/11    Consultants:  Orthopedics Neurology Palliative care  Procedures:  ORIF right femur fracture, Dr. Hulda Humphrey; 10/5  Antimicrobials:  Ceftriaxone 10/5 - 10/7 Perioperative cefazolin 10/5   Subjective: Patient seen examined bedside, sleeping.   Somnolent.  Will open eyes to command.  Refused to take her medications this morning. No family present.  Denies pain, unable to obtain any further ROS due to underlying dementia.  No acute events otherwise reported overnight per nursing staff.  Objective: Vitals:   02/20/23 1436 02/20/23 1927 02/21/23 0437 02/21/23 0801  BP: (!) 146/82 (!) 156/99 (!) 172/81 (!) 161/75  Pulse: (!) 105  87 73  Resp: 18 20 20 16   Temp: 98.8 F (37.1 C)  98 F (36.7 C) 98.7 F (37.1 C)  TempSrc: Oral Oral    SpO2: 97% 93%  99%  Weight:      Height:        Intake/Output Summary (Last 24 hours) at 02/21/2023 1111 Last data filed at 02/21/2023 0900 Gross per 24 hour  Intake 160 ml  Output 1300 ml  Net -1140 ml   Filed Weights   02/16/23 1220  Weight: 57.2 kg    Examination:  Physical Exam: GEN: NAD, somnolent, opens eyes to command, elderly in appearance, hard of hearing HEENT: NCAT, PERRL, EOMI, sclera clear, MMM PULM: CTAB w/o wheezes/crackles, normal respiratory effort, on room air CV: RRR w/o M/G/R GI: abd soft, NTND, NABS, no R/G/M MSK: no peripheral edema, surgical incision site noted with dressing in place, clean/dry/intact Integumentary: Surgical incision site noted with dressing in place, clean/dry/intact; otherwise no other concerning rashes/lesions/wounds noted on exposed skin surfaces.    Data Reviewed: I have personally reviewed following labs and imaging studies  CBC: Recent Labs  Lab 02/17/23 0753 02/17/23 1516 02/18/23 0634 02/19/23 0542 02/20/23 0605 02/21/23 0744  WBC 8.8  --  9.3 11.0* 12.6* 12.3*  HGB 6.4* 8.5* 8.4* 8.7* 10.2* 10.2*  HCT 19.7* 25.5* 26.5* 27.2* 31.1* 29.9*  MCV 100.5*  --  96.4 95.8 96.0 94.0  PLT 169  --  133* 159 235 256   Basic Metabolic Panel: Recent Labs  Lab 02/17/23 0753 02/18/23 0634 02/19/23 0542 02/20/23 0605 02/21/23 0744  NA 136 138 138 139 138  K 4.4 4.0 3.9 3.9 3.5  CL 104 107 109 105 102  CO2 23 20* 19* 21* 20*   GLUCOSE 131* 100* 86 95 82  BUN 39* 37* 34* 32* 36*  CREATININE 3.39* 3.22* 2.56* 2.17* 2.03*  CALCIUM 8.0* 7.7* 7.9* 8.5* 8.4*   GFR: Estimated Creatinine Clearance: 14.7 mL/min (A) (by C-G formula based on SCr of 2.03 mg/dL (H)). Liver Function Tests: No results for input(s): "AST", "ALT", "ALKPHOS", "BILITOT", "PROT", "ALBUMIN" in the last 168 hours. No results for input(s): "LIPASE", "AMYLASE" in the last 168 hours. No results for input(s): "AMMONIA" in the last 168 hours. Coagulation Profile: No results for input(s): "INR", "PROTIME" in the last 168 hours. Cardiac Enzymes: No results for input(s): "CKTOTAL", "CKMB", "CKMBINDEX", "TROPONINI" in the last 168 hours. BNP (last 3 results) No results for input(s): "PROBNP" in the last 8760 hours. HbA1C: No results for input(s): "HGBA1C" in the last 72 hours. CBG: No results for input(s): "GLUCAP" in the last 168 hours. Lipid Profile: No results for input(s): "CHOL", "HDL", "LDLCALC", "TRIG", "CHOLHDL", "LDLDIRECT" in the last 72 hours. Thyroid Function Tests: No results for input(s): "TSH", "T4TOTAL", "FREET4", "T3FREE", "THYROIDAB" in the last 72 hours. Anemia Panel: No results for input(s): "VITAMINB12", "FOLATE", "FERRITIN", "TIBC", "IRON", "RETICCTPCT" in the last 72 hours.  Sepsis Labs: No results for input(s): "PROCALCITON", "LATICACIDVEN" in the last 168 hours.  Recent Results (from the past 240 hour(s))  Surgical PCR screen     Status: Abnormal   Collection Time: 02/16/23  5:46 AM   Specimen: Nasal Mucosa; Nasal Swab  Result Value Ref Range Status   MRSA, PCR NEGATIVE NEGATIVE Final   Staphylococcus aureus POSITIVE (A) NEGATIVE Final    Comment: (NOTE) The Xpert SA Assay (FDA approved for NASAL specimens in patients 77 years of age and older), is one component of a comprehensive surveillance program. It is not intended to diagnose infection nor to guide or monitor treatment. Performed at [facility],  [address] [place], [place] [address]   Urine Culture (for pregnant, neutropenic or urologic patients or patients with an indwelling urinary catheter)     Status: Abnormal   Collection Time: 02/17/23  8:10 AM   Specimen: Urine, Catheterized  Result Value Ref Range Status   Specimen Description URINE, CATHETERIZED  Final   Special Requests   Final    NONE Performed at [facility], [address], [place], [place] [address]    Culture 20,000 COLONIES/mL STAPHYLOCOCCUS AUREUS (A)  Final   Report Status 02/19/2023 FINAL  Final   Organism ID, Bacteria STAPHYLOCOCCUS AUREUS (A)  Final      Susceptibility   Staphylococcus aureus - MIC*    CIPROFLOXACIN <=0.5 SENSITIVE Sensitive     GENTAMICIN <=0.5 SENSITIVE Sensitive     NITROFURANTOIN <=16 SENSITIVE Sensitive     OXACILLIN 0.5 SENSITIVE Sensitive     TETRACYCLINE <=1 SENSITIVE Sensitive     VANCOMYCIN <=0.5 SENSITIVE Sensitive     TRIMETH/SULFA <=10 SENSITIVE Sensitive     CLINDAMYCIN RESISTANT Resistant     RIFAMPIN <=0.5 SENSITIVE Sensitive     Inducible Clindamycin POSITIVE Resistant     LINEZOLID 2 SENSITIVE Sensitive     * 20,000 COLONIES/mL STAPHYLOCOCCUS AUREUS         Radiology Studies: No results found.      Scheduled Meds:  sodium chloride   Intravenous Once   amLODipine  5 mg Oral Daily   aspirin EC  81 mg Oral BID   cetirizine  10 mg Oral QPM   Chlorhexidine Gluconate Cloth  6 each Topical Q0600   cholecalciferol  1,000 Units Oral Daily   donepezil  5 mg Oral QHS   escitalopram  20 mg Oral Daily   ferrous sulfate  325 mg Oral Q breakfast   levothyroxine  50 mcg Oral Daily   senna-docusate  1 tablet Oral BID   timolol  1 drop Both Eyes BID   Continuous Infusions:   LOS: 6 days    Time spent: 56 minutes spent on chart review, discussion with nursing staff, consultants, updating family and interview/physical exam; more than 50% of that time was spent in counseling and/or coordination of  care.    Alvira Philips [place], DO [facility] Available via Epic secure chat 7am-7pm After these hours, please refer to coverage provider listed on [email or web address] 02/21/2023, 11:11 AM

## 2023-02-21 NOTE — Plan of Care (Signed)
  Problem: Clinical Measurements: Goal: Will remain free from infection Outcome: Progressing Goal: Respiratory complications will improve Outcome: Progressing   Problem: Nutrition: Goal: Adequate nutrition will be maintained Outcome: Not Progressing

## 2023-02-22 DIAGNOSIS — S7291XA Unspecified fracture of right femur, initial encounter for closed fracture: Secondary | ICD-10-CM | POA: Diagnosis not present

## 2023-02-22 LAB — BASIC METABOLIC PANEL
Anion gap: 13 (ref 5–15)
BUN: 43 mg/dL — ABNORMAL HIGH (ref 8–23)
CO2: 22 mmol/L (ref 22–32)
Calcium: 8.5 mg/dL — ABNORMAL LOW (ref 8.9–10.3)
Chloride: 104 mmol/L (ref 98–111)
Creatinine, Ser: 2.07 mg/dL — ABNORMAL HIGH (ref 0.44–1.00)
GFR, Estimated: 23 mL/min — ABNORMAL LOW (ref >60)
Glucose, Bld: 87 mg/dL (ref 70–99)
Potassium: 3.4 mmol/L — ABNORMAL LOW (ref 3.5–5.1)
Sodium: 139 mmol/L (ref 135–145)

## 2023-02-22 LAB — CBC
HCT: 28.9 % — ABNORMAL LOW (ref 36.0–46.0)
Hemoglobin: 9.4 g/dL — ABNORMAL LOW (ref 12.0–15.0)
MCH: 30.5 pg (ref 26.0–34.0)
MCHC: 32.5 g/dL (ref 30.0–36.0)
MCV: 93.8 fL (ref 80.0–100.0)
Platelets: 261 10*3/uL (ref 150–400)
RBC: 3.08 MIL/uL — ABNORMAL LOW (ref 3.87–5.11)
RDW: 14.2 % (ref 11.5–15.5)
WBC: 9.9 10*3/uL (ref 4.0–10.5)
nRBC: 0 % (ref 0.0–0.2)

## 2023-02-22 MED ORDER — FERROUS SULFATE 325 (65 FE) MG PO TABS: 325.0000 mg | ORAL_TABLET | Freq: Every day | ORAL | Status: AC

## 2023-02-22 MED ORDER — POTASSIUM CHLORIDE 10 MEQ/100ML IV SOLN
10.0000 meq | INTRAVENOUS | Status: AC
  Administered 2023-02-22: 10 meq via INTRAVENOUS
  Filled 2023-02-22: qty 100

## 2023-02-22 MED ORDER — DIAZEPAM 2 MG PO TABS: 1.0000 mg | ORAL_TABLET | Freq: Every day | ORAL | 0 refills | Status: AC | PRN

## 2023-02-22 MED ORDER — ASPIRIN 81 MG PO TBEC: 81.0000 mg | DELAYED_RELEASE_TABLET | Freq: Two times a day (BID) | ORAL | Status: AC

## 2023-02-22 MED ORDER — SENNOSIDES-DOCUSATE SODIUM 8.6-50 MG PO TABS: 1.0000 | ORAL_TABLET | Freq: Two times a day (BID) | ORAL | Status: AC

## 2023-02-22 MED ORDER — VITAMIN D3 25 MCG PO TABS: 1000.0000 [IU] | ORAL_TABLET | Freq: Every day | ORAL | Status: AC

## 2023-02-22 MED ORDER — POLYETHYLENE GLYCOL 3350 17 G PO PACK: 17.0000 g | PACK | Freq: Every day | ORAL | Status: AC | PRN

## 2023-02-22 NOTE — TOC Transition Note (Signed)
Transition of Care [facility]) - CM/SW Discharge Note   Patient Details  Name: Laura Ellis MRN: [id number] Date of Birth: 1932/09/27  Transition of Care [facility]) CM/SW Contact:  Lorri Frederick, LCSW Phone Number: 02/22/2023, 10:57 AM   Clinical Narrative:   Pt discharging to [facility].  RN call report to [phone].     Final next level of care: Skilled Nursing Facility Barriers to Discharge: Barriers Resolved   Patient Goals and CMS Choice      Discharge Placement                Patient chooses bed at: [facility] Patient to be transferred to facility by: [facility] Name of family member notified: daughter Marcelino Duster Patient and family notified of of transfer: 02/22/23  Discharge Plan and Services Additional resources added to the After Visit Summary for     Discharge Planning Services: CM Consult                                 Social Determinants of Health (SDOH) Interventions SDOH Screenings   Food Insecurity: No Food Insecurity (02/15/2023)  Housing: Low Risk  (02/15/2023)  Transportation Needs: No Transportation Needs (02/15/2023)  Utilities: Not At Risk (02/15/2023)  Alcohol Screen: Low Risk  (05/10/2020)  Depression (PHQ2-9): Low Risk  (05/21/2022)  Financial Resource Strain: Low Risk  (01/03/2022)   Received from [facility], [facility]  Physical Activity: Inactive (05/10/2020)  Social Connections: Unknown (09/21/2021)   Received from [facility], [facility]  Stress: No Stress Concern Present (01/03/2022)   Received from [facility], [facility]  Tobacco Use: Medium Risk (02/16/2023)     Readmission Risk Interventions     No data to display

## 2023-02-22 NOTE — TOC Progression Note (Addendum)
Transition of Care [facility]) - Progression Note    Patient Details  Name: Laura Ellis MRN: [id number] Date of Birth: 1932-05-20  Transition of Care [facility]) CM/SW Contact  Lorri Frederick, LCSW Phone Number: 02/22/2023, 10:12 AM  Clinical Narrative:  CSW confirmed with [facility] that they can receive pt today.   1045: CSW spoke with pt daughter Marcelino Duster regarding outpt Palliative referral.  She wants to come to the hospital and see different agency options.  She will come find CSW when she arrives  1200: CSW met with Marcelino Duster, list of palliative providers provided, she wants to contact [facility] on her own.    Expected Discharge Plan: Skilled Nursing Facility Barriers to Discharge: Continued Medical Work up  Expected Discharge Plan and Services   Discharge Planning Services: CM Consult   Living arrangements for the past 2 months: Single Family Home                                       Social Determinants of Health (SDOH) Interventions SDOH Screenings   Food Insecurity: No Food Insecurity (02/15/2023)  Housing: Low Risk  (02/15/2023)  Transportation Needs: No Transportation Needs (02/15/2023)  Utilities: Not At Risk (02/15/2023)  Alcohol Screen: Low Risk  (05/10/2020)  Depression (PHQ2-9): Low Risk  (05/21/2022)  Financial Resource Strain: Low Risk  (01/03/2022)   Received from [facility], [facility]  Physical Activity: Inactive (05/10/2020)  Social Connections: Unknown (09/21/2021)   Received from [facility], [facility]  Stress: No Stress Concern Present (01/03/2022)   Received from [facility], [facility]  Tobacco Use: Medium Risk (02/16/2023)    Readmission Risk Interventions     No data to display

## 2023-02-22 NOTE — Discharge Summary (Signed)
Physician Discharge Summary  Laura Ellis JXB:[id number] DOB: Apr 12, 1933 DOA: 02/15/2023  PCP: Patient, No Pcp Per  Admit date: 02/15/2023 Discharge date: 02/22/2023  Admitted From: Home Disposition: [facility]  Recommendations for Outpatient Follow-up:  Follow up with PCP in 1-2 weeks Outpatient follow-up with orthopedics, Dr. Hulda Humphrey in 2 weeks for wound check and repeat x-rays Recommend outpatient palliative care to follow, if no significant improvement in terms of her failure to thrive, oral intake would suggest transitioning to hospice level of care as long-term prognosis poor with her advancing dementia which is a terminal illness  Discharge Condition: Stable, long-term prognosis poor given her advanced dementia CODE STATUS: DNR Diet recommendation: Regular diet  History of present illness:  Laura Ellis is a 89 y.o. female with past medical history significant for HTN, hypothyroidism, CKD stage IV, chronic back pain, osteoporosis, dementia who presented to [facility] ED on 10/4 from home with right sided hip pain after fall at home.  No loss of consciousness reported.  Workup in the ED notable for right comminuted femoral neck fracture.  Orthopedics was consulted underwent ORIF on 02/16/2023 by Dr. Hulda Humphrey.  Hospital course complicated by acute blood loss anemia, acute renal failure on CKD stage IV.  Seen by palliative care, nephrology.    Hospital course:  Right comminuted femoral neck fracture Patient presenting from home following mechanical fall with associated acute right hip pain and inability to ambulate.  No loss of consciousness.  Imaging notable for right comminuted femoral neck fracture.  Orthopedics was consulted and patient underwent ORIF with IMN on 02/16/2023 by Dr. Hulda Humphrey.  Vitamin D 25-hydroxy level low at 24.56.  Started on colecalciferol 1000 units p.o. daily.  Patient weightbearing as tolerates right lower extremity.  Aspirin 80 mg p.o. twice daily for DVT prophylaxis  x 6 weeks per orthopedics.  Tylenol as needed for pain control.  Would avoid narcotics given her dementia/delirium. Outpatient follow-up with orthopedics, 2 weeks for wound check and repeat x-ray.  Discharging to SNF.   Acute blood loss anemia Iron deficiency anemia In the setting of large bone fracture and surgery.  Anemia panel with iron 11, TIBC 162, ferritin 186.  Patient was transfused 2 unit PRBCs during hospitalization.  Started on ferrous sulfate 3 and 25 mg p.o. daily.  Hemoglobin stable, 10.2 at time of discharge.   Acute renal failure on CKD stage IV Baseline creatinine 1.8.  Creatinine trended up during hospitalization likely secondary to hemodynamic compromise with decreased effective arterial blood flow.  Renal ultrasound showing smaller kidneys than 5 years ago.  Seen by nephrology with recommendation of maintaining blood pressure greater than 110/60; supportive care with transfusion and IV fluid hydration as needed.  Creatinine improved to 2.07 at time of discharge.  Continue to encourage increased oral intake.  Recommend repeat BMP 1 week.   Leukocytosis Patient presenting with elevated WBC count.  Unclear if from hemoconcentration from dehydration versus UTI given unable to communicate due to underlying dementia.  Completed 3-day course of ceftriaxone.  Remains afebrile.  WBC count normalized at time of discharge, 9.9.   Essential hypertension Amlodipine 5 mg p.o. daily.  Discontinued home losartan due to renal insufficiency.   Hypothyroidism Levothyroxine 50 mcg p.o. daily   Dementia Hard of hearing -- Delirium precautions -- Get up during the day -- Encourage a familiar face to remain present throughout the day -- Keep blinds open and lights on during daylight hours -- Minimize the use of opioids/benzodiazepines -- Donepezil 5 mg  p.o. nightly  Depression/anxiety: -- Lexapro 10 mg p.o. daily -- Valium 1-2 mg p.o. daily as needed anxiety   Adult failure to  thrive Continue to encourage increased oral intake, needs assistance with feeding.   Goals of care Elderly female with dementia now with hip fracture, progressive renal failure. Currently DNR; overall prognosis remains guarded. She lives with her daughters.  Discharging SNF.  Seen by palliative care with patient and outpatient palliative care to follow on discharge.  If no significant improvement in overall failure to thrive, would recommend transitioning to hospice given advancing dementia which is a terminal illness.  Discharge Diagnoses:  Principal Problem:   Right femoral fracture (HCC) Active Problems:   Osteoporosis   Essential hypertension   Hypothyroidism   CKD (chronic kidney disease) stage 4, GFR 15-29 ml/min (HCC)   PAD (peripheral artery disease) (HCC)   Gastroesophageal reflux disease without esophagitis   Hyperlipidemia, unspecified   Dementia (HCC)    Discharge Instructions  Discharge Instructions     Call MD for:  difficulty breathing, headache or visual disturbances   Complete by: As directed    Call MD for:  extreme fatigue   Complete by: As directed    Call MD for:  persistant dizziness or light-headedness   Complete by: As directed    Call MD for:  persistant nausea and vomiting   Complete by: As directed    Call MD for:  severe uncontrolled pain   Complete by: As directed    Call MD for:  temperature >100.4   Complete by: As directed    Diet - low sodium heart healthy   Complete by: As directed    Increase activity slowly   Complete by: As directed       Allergies as of 02/22/2023       Reactions   Alendronate Sodium    Esophagitis    Ace Inhibitors    Cough   Ezetimibe Other (See Comments)   Unknown rxn   Fenofibrate    Arthralgia    Other    Other reaction(s): dizziness   Rosuvastatin Calcium    Muscle aches   Statins Other (See Comments)   Muscle aches        Medication List     STOP taking these medications    famotidine  20 MG tablet Commonly known as: PEPCID   losartan 25 MG tablet Commonly known as: COZAAR   OLANZapine 2.5 MG tablet Commonly known as: ZYPREXA   oxycodone 5 MG capsule Commonly known as: OXY-IR   VITAMIN B-12 PO   vitamin C 1000 MG tablet       TAKE these medications    amLODipine 5 MG tablet Commonly known as: NORVASC TAKE ONE TABLET BY MOUTH EVERY DAY   aspirin EC 81 MG tablet Take 1 tablet (81 mg total) by mouth 2 (two) times daily. Swallow whole. What changed:  when to take this additional instructions   CoQ10 100 MG Caps Take 100 mg by mouth daily.   diazepam 2 MG tablet Commonly known as: VALIUM Take 0.5-1 tablets (1-2 mg total) by mouth daily as needed for anxiety. What changed: reasons to take this   donepezil 5 MG tablet Commonly known as: ARICEPT Take 5 mg by mouth at bedtime.   DuoDERM CGF Extra Thin Misc Apply every 72 hours What changed:  when to take this reasons to take this   escitalopram 20 MG tablet Commonly known as: LEXAPRO Take 1 tablet (20 mg  total) by mouth daily.   ferrous sulfate 325 (65 FE) MG tablet Take 1 tablet (325 mg total) by mouth daily with breakfast.   gabapentin 100 MG capsule Commonly known as: NEURONTIN Take 100 mg by mouth 2 (two) times daily as needed (nerve pain).   levocetirizine 5 MG tablet Commonly known as: XYZAL Take 5 mg by mouth every evening.   levothyroxine 50 MCG tablet Commonly known as: SYNTHROID TAKE ONE TABLET BY MOUTH EVERY DAY   polyethylene glycol 17 g packet Commonly known as: MIRALAX / GLYCOLAX Take 17 g by mouth daily as needed for mild constipation.   senna-docusate 8.6-50 MG tablet Commonly known as: Senokot-S Take 1 tablet by mouth 2 (two) times daily.   Thalitone 15 MG tablet Generic drug: chlorthalidone Take 1 tablet (15 mg total) by mouth 2 (two) times a week. Tuesdays and Fridays once a day. What changed: See the new instructions.   timolol 0.25 % ophthalmic  solution Commonly known as: TIMOPTIC Place 1 drop into both eyes 2 (two) times daily.   vitamin D3 25 MCG tablet Commonly known as: CHOLECALCIFEROL Take 1 tablet (1,000 Units total) by mouth daily.        Contact information for after-discharge care     Destination     [facility] Preferred SNF .   Service: Skilled Nursing Contact information: [address] [place] [address] [phone]                    Allergies  Allergen Reactions   Alendronate Sodium     Esophagitis    Ace Inhibitors     Cough   Ezetimibe Other (See Comments)    Unknown rxn   Fenofibrate     Arthralgia    Other     Other reaction(s): dizziness   Rosuvastatin Calcium     Muscle aches   Statins Other (See Comments)    Muscle aches    Consultations: Orthopedics, Dr. Hulda Humphrey Palliative care Neurology   Procedures/Studies: US RENAL  Result Date: 02/17/2023 CLINICAL DATA:  Acute kidney injury EXAM: RENAL / URINARY TRACT ULTRASOUND COMPLETE COMPARISON:  Renal ultrasound 06/13/2017 FINDINGS: Right Kidney: Renal measurements: 7.4 x 3.8 x 6.1 cm = volume: 75 mL. Global atrophy. No collecting system dilatation or perinephric fluid. Left Kidney: Renal measurements: 7.5 x 4.1 x 4.6 cm = volume: 74 mL. Global volume loss. No collecting system dilatation or perinephric fluid. There is exophytic simple appearing cyst along the inferior aspect of the kidney which is anechoic, smoothly margins and through transmission measuring 7.0 x 5.4 x 6.2 cm previously this measured 7.6 cm. Bladder: Bladder is contracted. Other: None. IMPRESSION: Bilateral renal atrophy. No collecting system dilatation. Simple left-sided renal cyst. Electronically Signed   By: Karen Kays M.D.   On: 02/17/2023 16:01   DG HIP UNILAT WITH PELVIS 2-3 VIEWS RIGHT  Result Date: 02/16/2023 CLINICAL DATA:  Elective surgery. EXAM: DG HIP (WITH OR WITHOUT PELVIS) 2-3V RIGHT COMPARISON:   Preoperative imaging FINDINGS: Three fluoroscopic spot views of the right hip obtained in the operating room. Femoral intramedullary nail with trans trochanteric and distal locking screw fixation traverse proximal femur fracture. Fluoroscopy time 2 minutes 5 seconds. Dose 17.4 mGy. IMPRESSION: Intraoperative fluoroscopy during right hip ORIF. Electronically Signed   By: Narda Rutherford M.D.   On: 02/16/2023 14:54   DG C-Arm 1-60 Min-No Report  Result Date: 02/16/2023 Fluoroscopy was utilized by the requesting physician.  No radiographic  interpretation.   DG C-Arm 1-60 Min-No Report  Result Date: 02/16/2023 Fluoroscopy was utilized by the requesting physician.  No radiographic interpretation.   DG Chest 1 View  Result Date: 02/15/2023 CLINICAL DATA:  Right hip fracture. Medical clearance for surgical intervention. EXAM: CHEST  1 VIEW COMPARISON:  None Available. FINDINGS: Lungs are clear. No pneumothorax or pleural effusion. Cardiac size within normal limits. Pulmonary vascularity is normal. No acute bone abnormality. IMPRESSION: 1. No active disease. Electronically Signed   By: Helyn Numbers M.D.   On: 02/15/2023 21:16   DG HIP UNILAT W OR W/O PELVIS 2-3 VIEWS RIGHT  Result Date: 02/15/2023 CLINICAL DATA:  Fall, right hip pain EXAM: DG HIP (WITH OR WITHOUT PELVIS) 2-3V RIGHT COMPARISON:  None Available. FINDINGS: There is an acute, comminuted, overriding intratrochanteric right hip fracture with varus angulation of the major distal fracture fragment. No dislocation with the femoral head seated within the acetabulum. Superimposed mild bilateral degenerative hip arthritis. Pelvis and visualized left hip appear intact. Vascular calcifications noted. IMPRESSION: 1. Acute, comminuted, overriding intratrochanteric right hip fracture. Electronically Signed   By: Helyn Numbers M.D.   On: 02/15/2023 21:15     Subjective: Patient seen examined bedside, resting calmly.  Alert, pleasantly confused.   States "not hungry".  No family present at bedside.  Denies pain.  Unable to obtain any further ROS due to underlying dementia.  No acute events overnight per nursing.  Discharging to SNF today.  Discharge Exam: Vitals:   02/22/23 0452 02/22/23 0730  BP: 138/79 (!) 148/62  Pulse: 75 72  Resp: 16 16  Temp:  97.8 F (36.6 C)  SpO2: 94% 96%   Vitals:   02/21/23 1659 02/21/23 2035 02/22/23 0452 02/22/23 0730  BP: 131/80 (!) 151/60 138/79 (!) 148/62  Pulse: 92 72 75 72  Resp: 16 16 16 16   Temp: 98 F (36.7 C) 98 F (36.7 C)  97.8 F (36.6 C)  TempSrc:  Oral    SpO2: (!) 87% 94% 94% 96%  Weight:      Height:        Physical Exam: GEN: NAD, alert and oriented to self, otherwise pleasantly confused, elderly in appearance, hard of hearing HEENT: NCAT, PERRL, EOMI, sclera clear, MMM PULM: CTAB w/o wheezes/crackles, normal respiratory effort, on room air CV: RRR w/o M/G/R GI: abd soft, NTND, NABS, no R/G/M MSK: no peripheral edema, moves all extremities independently NEURO: No focal neurological deficits PSYCH: normal mood/affect Integumentary: Surgical incision site noted with dressing in place, clean/dry/intact; otherwise no other concerning rashes/lesions/wounds noted on exposed skin surfaces.    The results of significant diagnostics from this hospitalization (including imaging, microbiology, ancillary and laboratory) are listed below for reference.     Microbiology: Recent Results (from the past 240 hour(s))  Surgical PCR screen     Status: Abnormal   Collection Time: 02/16/23  5:46 AM   Specimen: Nasal Mucosa; Nasal Swab  Result Value Ref Range Status   MRSA, PCR NEGATIVE NEGATIVE Final   Staphylococcus aureus POSITIVE (A) NEGATIVE Final    Comment: (NOTE) The Xpert SA Assay (FDA approved for NASAL specimens in patients 87 years of age and older), is one component of a comprehensive surveillance program. It is not intended to diagnose infection nor to guide or  monitor treatment. Performed at [facility], [address], [place], [place] [address]   Urine Culture (for pregnant, neutropenic or urologic patients or patients with an indwelling urinary catheter)  Status: Abnormal   Collection Time: 02/17/23  8:10 AM   Specimen: Urine, Catheterized  Result Value Ref Range Status   Specimen Description URINE, CATHETERIZED  Final   Special Requests   Final    NONE Performed at [facility], [address] [place], [place] [address]    Culture 20,000 COLONIES/mL STAPHYLOCOCCUS AUREUS (A)  Final   Report Status 02/19/2023 FINAL  Final   Organism ID, Bacteria STAPHYLOCOCCUS AUREUS (A)  Final      Susceptibility   Staphylococcus aureus - MIC*    CIPROFLOXACIN <=0.5 SENSITIVE Sensitive     GENTAMICIN <=0.5 SENSITIVE Sensitive     NITROFURANTOIN <=16 SENSITIVE Sensitive     OXACILLIN 0.5 SENSITIVE Sensitive     TETRACYCLINE <=1 SENSITIVE Sensitive     VANCOMYCIN <=0.5 SENSITIVE Sensitive     TRIMETH/SULFA <=10 SENSITIVE Sensitive     CLINDAMYCIN RESISTANT Resistant     RIFAMPIN <=0.5 SENSITIVE Sensitive     Inducible Clindamycin POSITIVE Resistant     LINEZOLID 2 SENSITIVE Sensitive     * 20,000 COLONIES/mL STAPHYLOCOCCUS AUREUS     Labs: BNP (last 3 results) No results for input(s): "BNP" in the last 8760 hours. Basic Metabolic Panel: Recent Labs  Lab 02/18/23 0634 02/19/23 0542 02/20/23 0605 02/21/23 0744 02/22/23 0609  NA 138 138 139 138 139  K 4.0 3.9 3.9 3.5 3.4*  CL 107 109 105 102 104  CO2 20* 19* 21* 20* 22  GLUCOSE 100* 86 95 82 87  BUN 37* 34* 32* 36* 43*  CREATININE 3.22* 2.56* 2.17* 2.03* 2.07*  CALCIUM 7.7* 7.9* 8.5* 8.4* 8.5*   Liver Function Tests: No results for input(s): "AST", "ALT", "ALKPHOS", "BILITOT", "PROT", "ALBUMIN" in the last 168 hours. No results for input(s): "LIPASE", "AMYLASE" in the last 168 hours. No results for input(s): "AMMONIA" in the last 168 hours. CBC: Recent Labs  Lab  02/18/23 0634 02/19/23 0542 02/20/23 0605 02/21/23 0744 02/22/23 0609  WBC 9.3 11.0* 12.6* 12.3* 9.9  HGB 8.4* 8.7* 10.2* 10.2* 9.4*  HCT 26.5* 27.2* 31.1* 29.9* 28.9*  MCV 96.4 95.8 96.0 94.0 93.8  PLT 133* 159 235 256 261   Cardiac Enzymes: No results for input(s): "CKTOTAL", "CKMB", "CKMBINDEX", "TROPONINI" in the last 168 hours. BNP: Invalid input(s): "POCBNP" CBG: No results for input(s): "GLUCAP" in the last 168 hours. D-Dimer No results for input(s): "DDIMER" in the last 72 hours. Hgb A1c No results for input(s): "HGBA1C" in the last 72 hours. Lipid Profile No results for input(s): "CHOL", "HDL", "LDLCALC", "TRIG", "CHOLHDL", "LDLDIRECT" in the last 72 hours. Thyroid function studies No results for input(s): "TSH", "T4TOTAL", "T3FREE", "THYROIDAB" in the last 72 hours.  Invalid input(s): "FREET3" Anemia work up No results for input(s): "VITAMINB12", "FOLATE", "FERRITIN", "TIBC", "IRON", "RETICCTPCT" in the last 72 hours. Urinalysis    Component Value Date/Time   COLORURINE AMBER (A) 02/16/2023 1155   APPEARANCEUR CLOUDY (A) 02/16/2023 1155   LABSPEC 1.013 02/16/2023 1155   PHURINE 6.0 02/16/2023 1155   GLUCOSEU NEGATIVE 02/16/2023 1155   HGBUR NEGATIVE 02/16/2023 1155   BILIRUBINUR NEGATIVE 02/16/2023 1155   KETONESUR NEGATIVE 02/16/2023 1155   PROTEINUR 30 (A) 02/16/2023 1155   NITRITE NEGATIVE 02/16/2023 1155   LEUKOCYTESUR LARGE (A) 02/16/2023 1155   Sepsis Labs Recent Labs  Lab 02/19/23 0542 02/20/23 0605 02/21/23 0744 02/22/23 0609  WBC 11.0* 12.6* 12.3* 9.9   Microbiology Recent Results (from the past 240 hour(s))  Surgical PCR screen  Status: Abnormal   Collection Time: 02/16/23  5:46 AM   Specimen: Nasal Mucosa; Nasal Swab  Result Value Ref Range Status   MRSA, PCR NEGATIVE NEGATIVE Final   Staphylococcus aureus POSITIVE (A) NEGATIVE Final    Comment: (NOTE) The Xpert SA Assay (FDA approved for NASAL specimens in patients 22 years of  age and older), is one component of a comprehensive surveillance program. It is not intended to diagnose infection nor to guide or monitor treatment. Performed at [facility], [address] [place], [place] [address]   Urine Culture (for pregnant, neutropenic or urologic patients or patients with an indwelling urinary catheter)     Status: Abnormal   Collection Time: 02/17/23  8:10 AM   Specimen: Urine, Catheterized  Result Value Ref Range Status   Specimen Description URINE, CATHETERIZED  Final   Special Requests   Final    NONE Performed at [facility], [address] [place], [place] [address]    Culture 20,000 COLONIES/mL STAPHYLOCOCCUS AUREUS (A)  Final   Report Status 02/19/2023 FINAL  Final   Organism ID, Bacteria STAPHYLOCOCCUS AUREUS (A)  Final      Susceptibility   Staphylococcus aureus - MIC*    CIPROFLOXACIN <=0.5 SENSITIVE Sensitive     GENTAMICIN <=0.5 SENSITIVE Sensitive     NITROFURANTOIN <=16 SENSITIVE Sensitive     OXACILLIN 0.5 SENSITIVE Sensitive     TETRACYCLINE <=1 SENSITIVE Sensitive     VANCOMYCIN <=0.5 SENSITIVE Sensitive     TRIMETH/SULFA <=10 SENSITIVE Sensitive     CLINDAMYCIN RESISTANT Resistant     RIFAMPIN <=0.5 SENSITIVE Sensitive     Inducible Clindamycin POSITIVE Resistant     LINEZOLID 2 SENSITIVE Sensitive     * 20,000 COLONIES/mL STAPHYLOCOCCUS AUREUS     Time coordinating discharge: Over 30 minutes  SIGNED:   Alvira Philips [place], DO  [facility] 02/22/2023, 10:37 AM

## 2023-02-22 NOTE — Discharge Planning (Signed)
Patient alert and oriented to self. IV access removed. Discharge teaching given to [facility] at [facility]. Discharge summary placed in discharge packet along with written prescription written by discharge provider. Patient will be transported to the facility via ptar.

## 2023-03-15 DEATH — deceased
